# Patient Record
Sex: Male | Born: 1965 | Race: White | Hispanic: No | Smoking: Never smoker
Health system: Southern US, Community
[De-identification: ages and names within clinical notes are randomized; demographics above are authoritative.]

## PROBLEM LIST (undated history)

## (undated) DIAGNOSIS — A879 Viral meningitis, unspecified: Secondary | ICD-10-CM

## (undated) DIAGNOSIS — M549 Dorsalgia, unspecified: Secondary | ICD-10-CM

## (undated) DIAGNOSIS — I776 Arteritis, unspecified: Secondary | ICD-10-CM

## (undated) DIAGNOSIS — I252 Old myocardial infarction: Secondary | ICD-10-CM

## (undated) DIAGNOSIS — G4730 Sleep apnea, unspecified: Secondary | ICD-10-CM

## (undated) DIAGNOSIS — E662 Morbid (severe) obesity with alveolar hypoventilation: Secondary | ICD-10-CM

## (undated) DIAGNOSIS — R0602 Shortness of breath: Secondary | ICD-10-CM

## (undated) DIAGNOSIS — R1310 Dysphagia, unspecified: Secondary | ICD-10-CM

## (undated) DIAGNOSIS — I2510 Atherosclerotic heart disease of native coronary artery without angina pectoris: Secondary | ICD-10-CM

## (undated) DIAGNOSIS — I5030 Unspecified diastolic (congestive) heart failure: Secondary | ICD-10-CM

## (undated) DIAGNOSIS — L301 Dyshidrosis [pompholyx]: Secondary | ICD-10-CM

## (undated) HISTORY — DX: Morbid (severe) obesity with alveolar hypoventilation: E66.2

## (undated) HISTORY — DX: Sleep apnea, unspecified: G47.30

## (undated) HISTORY — DX: Dyshidrosis (pompholyx): L30.1

## (undated) HISTORY — DX: Arteritis, unspecified: I77.6

## (undated) HISTORY — DX: Dysphagia, unspecified: R13.10

## (undated) HISTORY — PX: CARDIAC CATHETERIZATION: SHX172

## (undated) HISTORY — DX: Shortness of breath: R06.02

## (undated) HISTORY — DX: Dorsalgia, unspecified: M54.9

## (undated) HISTORY — DX: Old myocardial infarction: I25.2

## (undated) HISTORY — DX: Viral meningitis, unspecified: A87.9

---

## 2001-04-17 ENCOUNTER — Encounter: Payer: Self-pay | Admitting: Gastroenterology

## 2001-04-17 ENCOUNTER — Encounter: Admission: RE | Admit: 2001-04-17 | Discharge: 2001-04-17 | Payer: Self-pay | Admitting: Gastroenterology

## 2004-08-24 ENCOUNTER — Inpatient Hospital Stay (HOSPITAL_COMMUNITY): Admission: EM | Admit: 2004-08-24 | Discharge: 2004-08-26 | Payer: Self-pay | Admitting: Emergency Medicine

## 2005-06-08 ENCOUNTER — Emergency Department (HOSPITAL_COMMUNITY): Admission: EM | Admit: 2005-06-08 | Discharge: 2005-06-09 | Payer: Self-pay | Admitting: Emergency Medicine

## 2008-05-13 ENCOUNTER — Encounter: Admission: RE | Admit: 2008-05-13 | Discharge: 2008-05-13 | Payer: Self-pay | Admitting: Family Medicine

## 2008-12-18 ENCOUNTER — Emergency Department (HOSPITAL_COMMUNITY): Admission: EM | Admit: 2008-12-18 | Discharge: 2008-12-18 | Payer: Self-pay | Admitting: Emergency Medicine

## 2009-12-22 ENCOUNTER — Emergency Department (HOSPITAL_COMMUNITY): Admission: EM | Admit: 2009-12-22 | Discharge: 2009-12-23 | Payer: Self-pay | Admitting: Emergency Medicine

## 2010-02-02 ENCOUNTER — Encounter: Admission: RE | Admit: 2010-02-02 | Discharge: 2010-05-03 | Payer: Self-pay | Admitting: Family Medicine

## 2011-04-06 NOTE — Discharge Summary (Signed)
NAME:  Michael [name], Michael [name]                ACCOUNT NO.:  [id number]   MEDICAL RECORD NO.:  [id number]          PATIENT TYPE:  INP   LOCATION:  5507                         FACILITY:  [facility]   PHYSICIAN:  Jackie Plum, M.D.DATE OF BIRTH:  1966/03/29   DATE OF ADMISSION:  08/23/2004  DATE OF DISCHARGE:  08/26/2004                                 DISCHARGE SUMMARY   DISCHARGE DIAGNOSIS:  Aseptic meningitis, likely viral etiology.   DISCHARGE MEDICATIONS:  1.  Darvocet-N 100 1-2 tabs q.6h. p.r.n.  2.  Ceftin 500 mg p.o. b.i.d. for five days only.   DISCHARGE LABORATORY:  WBC count 7.3, hemoglobin 13.5, hematocrit 38.3, MCV  81.6, platelet count 180.  Sodium 107, potassium 3.8, chloride 110, CO2 22,  glucose 90, BUN 11, creatinine 1.0, calcium 7.9.   PROCEDURES:  Lumbar puncture done on August 23, 2004.  Diagnostic workup  with CSF studies for tube one and four notable for tube one ____being______  colorless, cloudy, hazy, WBC count 275 per mm cube which is elevated, RBC  count 11 per mm cube  which is elevated, segmented neutrophils 18, a shunt  was elevated, lymphocytes 60, monocytes 21, eosinophils 1, glucose 54, total  protein elevated at 92 mg/dL.  Corresponding tube four studies notable for  colorless color, cloudy, hazy, WBC 221, RBC 9, segmented neutrophils 23  which is highly elevated, lymphocytes 60, and monocytes 17.   CONSULTANTS : applicable.   CONDITION ON DISCHARGE:  Improved and satisfactory.   REASON FOR HOSPITALIZATION:  Septic meningitis.  The patient is a 45-year-  old gentleman who does not have any significant medical problems.  He  presented with generalized pain, some __________ neck pains, nausea,  vomiting, headaches, and photophobia.  He was suspected of having a  meningitis and therefore, was admitted for this.  Please see full details  regarding the patient's presentation in admission H&P dictated by Dr.  __________, dated August 24, 2004.   HOSPITAL  COURSE:  On admission, the patient was admitted to the hospitalist  service.  He had a leukocytosis of 12,000 and his metabolic panel was  notable for hypokalemia of 2.3.  Secondary to his GI symptomatology, he was  started on IV Rocephin for bacterial coverage.  An LP was done which was  notable for the above results which were consistent with possible viral  meningitis.  CSF studies were sent for culture, results still pending  finally at the time of discharge.  They need to be checked at the outpatient  level.  Also HSV PCR was also obtained and I have the result at this point  but this is highly unlikely in this patient and therefore, HSV treatment was  not initiated.  His hypokalemia was replenished to normalization at time of  discharge.  Gram stain of CSF actually revealed no organisms.  He was  treated with Rocephin for the last two days.  It is unlikely that he has  active meningitis.  He opted to continue antibiotics for five more days as a  precautionary measure awaiting final culture results.  The patient  discharged  home today in stable satisfactory condition.  His neck pain is  significantly improved.  He does not  have any more photophobia.  He is stronger, has been eating well.  His  leukocytosis is resolved.  He does not have any fever and is planned for  discharge today with outpatient follow up as recommended.   He is to call Dr. Wynonia Lawman office in 7-10 days to be seen during that  time.      Geor   GO/MEDQ  D:  08/26/2004  T:  08/26/2004  Job:  [id number]   cc:   Sigmund Hazel, M.D.  [address]  [address] [place], [place] [address]  Fax: [phone]

## 2011-04-06 NOTE — H&P (Signed)
NAME:  Michael [name]                ACCOUNT NO.:  [id number]   MEDICAL RECORD NO.:  [id number]          PATIENT TYPE:  INP   LOCATION:  1828                         FACILITY:  [facility]   PHYSICIAN:  Theone Stanley, MD   DATE OF BIRTH:  Feb 25, 1966   DATE OF ADMISSION:  08/23/2004  DATE OF DISCHARGE:                                HISTORY & PHYSICAL   CHIEF COMPLAINT:  Headache.   HISTORY AND PHYSICAL:  Michael [name] is a 45 year old gentleman who states  that yesterday at 10 p.m. he started to have whole body aching myalgias.  He  then subsequently started to have chills and he stated I could not get  warm.  He did not sleep that night and in the morning, which is today, he  had a fever.  At that point in time, his pain is his headache behind his  eyes, at the nap of his neck, and his lower back.  It is an aching pain that  is constant.  He had some nausea with some dry heaves and any type of  movement or strain caused an increase in pain in the head, nape of the neck  and lower back.  He has a stiff neck and continues to have generalized aches  and pains.  Because of this, he went to [facility].  At that time, they had a suspicion of meningitis and sent him to  the ER.  This is the way he presented.   PAST MEDICAL HISTORY:  None.   MEDICATIONS:  None.   PAST SURGICAL HISTORY:  None.   ALLERGIES:  None.   SOCIAL HISTORY:  The patient is married but currently separated from his  wife.  He lives with his daughter in [place].  He has one child who is in  kindergarten.  No tobacco use.  He occasionally drinks alcohol.  He never  engages in any drug use.   FAMILY HISTORY:  Both mother and father are alive and healthy.   REVIEW OF SYSTEMS:  Photophobia, lightheadedness.  He denies any seizures or  weakness, asymmetric weakness.  No GI symptoms.  No GU symptoms, endocrine,  cardiovascular, respiratory symptoms.   PHYSICAL EXAMINATION:  VITAL SIGNS:   Temperature 99.1, blood pressure  130/82, pulse 104, respirations 24, saturation 98% on room air.  HEENT:  Head atraumatic, normocephalic.  Eyes reveal pupils are equal and  reactive to light.  Extraocular movements intact.  Ears without discharge.  Throat, oropharynx clear.  NECK:  The patient has positive nuchal rigidity.  CARDIOVASCULAR:  Regular rate and rhythm.  No murmurs, rubs or gallops  heard.  RESPIRATORY:  Clear to auscultation bilaterally.  ABDOMEN:  Soft, nontender, nondistended with active bowel sounds.  EXTREMITIES:  No edema, cyanosis or clubbing.   LABORATORY DATA:  White count of 12.0, hemoglobin 15, hematocrit 42, MCV of  81.  Platelets of 183.  Sodium 134, potassium 2.3, chloride 110, BUN 13,  glucose 111.  Creatinine 1.1.  The PT, PTT and INR were normal.  Urinalysis  showed specific gravity of 1.022,  greater than 80 mg/dl of ketones, some  protein.  While here in the area, he had a CT scan which showed no acute  issues.  At that point in time, the patient underwent an LP which they  analyzed.  Tube #4 appeared hazy, RBCs at 9, WBC of 221.  In tube #1, RBC  11, WBC at 275.  In tube #1, the cell count showed neutrophils at 18,  lymphocytes at 60%, monocytes at 21%, eosinophils 1% and tube #4 showed  neutrophils of 23%, lymphocytes 60%, monocytes at 17%.  CSF protein was 92  and the CSF and glucose was normal.   ASSESSMENT AND PLAN:  This is a 45 year old gentleman with minimal medical  problems presenting with headache, nausea, vomiting, generalized aches and  pains.  1.  Aseptic meningitis.  The patient's CSF points to a viral meningitis      which there is no definitive treatment for.  It is more a symptomatic      relief.  The patient will be admitted for supportive care, IV hydration,      pain medications, antiemetics.  We will send the CSF for culture and HSV      PCR.  2.  He has a bit of hypokalemia.  We will replace this.  3.  Prophylaxis:  Start him on  Lovenox subcu and a PPI.       AEJ/MEDQ  D:  08/24/2004  T:  08/24/2004  Job:  [id number]

## 2012-05-08 ENCOUNTER — Other Ambulatory Visit: Payer: Self-pay | Admitting: Family Medicine

## 2012-05-08 DIAGNOSIS — R609 Edema, unspecified: Secondary | ICD-10-CM

## 2012-05-13 ENCOUNTER — Ambulatory Visit
Admission: RE | Admit: 2012-05-13 | Discharge: 2012-05-13 | Disposition: A | Discharge status: Home / Self Care | Payer: BC Managed Care – PPO | Source: Ambulatory Visit | Attending: Family Medicine | Admitting: Family Medicine

## 2012-05-13 DIAGNOSIS — R609 Edema, unspecified: Secondary | ICD-10-CM

## 2013-03-26 ENCOUNTER — Telehealth: Payer: Self-pay | Admitting: Neurology

## 2013-03-26 ENCOUNTER — Other Ambulatory Visit: Payer: Self-pay | Admitting: Neurology

## 2013-03-26 DIAGNOSIS — R0683 Snoring: Secondary | ICD-10-CM

## 2013-03-26 DIAGNOSIS — R351 Nocturia: Secondary | ICD-10-CM

## 2013-03-26 NOTE — Telephone Encounter (Signed)
This patient qualifies for attended sleep study based on clinical history.  Patient will need a SPLIT at AHI 10 and 3% scoring.  Offer lunesta prn, Insomnia is a chief complaint  PLEASE NOTE :  CO2 necessary. BMI is 55.

## 2013-03-26 NOTE — Telephone Encounter (Signed)
. °  Dr. Sigmund Hazel is referring Michael [name], 47 y.o. y/o male, for the evaluation of sleep apnea.  Wt:  366.8 lbs. Ht:  68.5 in. BMI:  54.95  Diagnoses: OSA - borderline  HTN Morbid Obesity Nocturia Witnessed Apneas Insomnia Snoring Frequent Awakenings Non-Restorative Sleep  Medication List: Current Outpatient Prescriptions  Medication Sig Dispense Refill   furosemide (LASIX) 40 MG tablet Take 40 mg by mouth daily.       losartan (COZAAR) 100 MG tablet Take 100 mg by mouth daily.       metoprolol succinate (TOPROL-XL) 100 MG 24 hr tablet Take 100 mg by mouth daily. Take with or immediately following a meal.       zolpidem (AMBIEN CR) 12.5 MG CR tablet Take 12.5 mg by mouth at bedtime as needed for sleep.       No current facility-administered medications for this visit.    This patient presents to Dr. Sigmund Hazel to discuss the possibility of a sleep study.  Pt says that he had a sleep study possibly with [place] over 5 years ago.  The study revealed borderline sleep apnea with cpap therapy as optional.  The patient did not choose to proceed with pap therapy at the time.  He has since gained an additional 60+ pounds.  He has a neck size of 18.  Pt complains of significant nocturia awakening at least 5 times per night to use the bathroom.  He has a hard time falling back asleep.  He reports witnessed apneic events and snoring.  He has had a recent dx of hypertension that he feels may be due to underlying sleep apnea.  Would like to have an attended study asap.  Insurance:  [employer] [place] - precertification is not required

## 2013-03-27 ENCOUNTER — Ambulatory Visit (INDEPENDENT_AMBULATORY_CARE_PROVIDER_SITE_OTHER): Payer: BC Managed Care – PPO | Admitting: Neurology

## 2013-03-27 DIAGNOSIS — I10 Essential (primary) hypertension: Secondary | ICD-10-CM

## 2013-03-27 DIAGNOSIS — G4710 Hypersomnia, unspecified: Secondary | ICD-10-CM

## 2013-03-27 DIAGNOSIS — R0683 Snoring: Secondary | ICD-10-CM

## 2013-03-27 DIAGNOSIS — R351 Nocturia: Secondary | ICD-10-CM

## 2013-03-27 DIAGNOSIS — R5383 Other fatigue: Secondary | ICD-10-CM

## 2013-03-27 DIAGNOSIS — G4733 Obstructive sleep apnea (adult) (pediatric): Secondary | ICD-10-CM

## 2013-03-31 ENCOUNTER — Other Ambulatory Visit: Payer: Self-pay | Admitting: *Deleted

## 2013-03-31 ENCOUNTER — Telehealth: Payer: Self-pay | Admitting: *Deleted

## 2013-03-31 DIAGNOSIS — G4733 Obstructive sleep apnea (adult) (pediatric): Secondary | ICD-10-CM

## 2013-03-31 NOTE — Telephone Encounter (Signed)
Called patient to discuss sleep study results from 03/27/13 .  Discussed findings, recommendations and follow up care.  Patient understood well and all questions were answered.  Pt understands the severity of his sleep apnea.  Pt actually received a call from [employer] yesterday and understands the DME company will be calling him ASAP.  He is excited to get started and remarks that he slept so much better here in the sleep lab with PAP therapy.

## 2013-08-11 ENCOUNTER — Encounter: Payer: Self-pay | Admitting: Neurology

## 2013-08-27 ENCOUNTER — Encounter: Payer: Self-pay | Admitting: Neurology

## 2013-08-27 ENCOUNTER — Encounter (INDEPENDENT_AMBULATORY_CARE_PROVIDER_SITE_OTHER): Payer: Self-pay

## 2013-08-27 ENCOUNTER — Ambulatory Visit (INDEPENDENT_AMBULATORY_CARE_PROVIDER_SITE_OTHER): Payer: BC Managed Care – PPO | Admitting: Neurology

## 2013-08-27 VITALS — BP 123/74 | HR 62 | Resp 16 | Ht 68.0 in | Wt 296.0 lb

## 2013-08-27 DIAGNOSIS — R351 Nocturia: Secondary | ICD-10-CM

## 2013-08-27 DIAGNOSIS — G4733 Obstructive sleep apnea (adult) (pediatric): Secondary | ICD-10-CM

## 2013-08-27 DIAGNOSIS — E662 Morbid (severe) obesity with alveolar hypoventilation: Secondary | ICD-10-CM | POA: Insufficient documentation

## 2013-08-27 DIAGNOSIS — G4730 Sleep apnea, unspecified: Secondary | ICD-10-CM

## 2013-08-27 HISTORY — DX: Sleep apnea, unspecified: G47.30

## 2013-08-27 NOTE — Progress Notes (Signed)
[facility]  Provider:  Melvyn Novas, M D  Referring Provider: No ref. provider found Primary Care Physician:  Neldon Labella, MD  Chief Complaint  Patient presents with  . Follow-up    sleep apnea, rm 11    HPI:  Michael [name] is a 47 y.o. male  Is seen here as a referral SLEEP CONSULTATION  from Dr. Hyacinth Meeker .  Mr. Murgia is seen here today following up on a sleep study. The patient was originally seen on 5-A.-14 and perhaps described symptoms that were avulsed likely to be present obstructive sleep apnea. These were her hypertension, nocturia, witnessed apneas, trouble sleeping through the night with frequent awakenings nonrestorative sleep. The patient had also been reportedly adult. The he was scheduled for the sleep study he has actually lost 72 pounds .  The patient reported that once the CPAP was issued to him after sleep apnea was confirmed the diagnosis he lost 20 pounds in the first 4 months of CPAP use, basically attributed to more restful and restorative sleep and more energy and daytime. The next 50 pounds were lost with dietary efforts and 2 months ago he begun exercising.  03-27-13  the essential information  from his sleep study the patient had an AHI of 121.8 there was no REM sleep nor did he slept all night and supine.  His oxygen at nadir was 76% with a total of 61.1 minutes of desaturation of oxygen. The pulse oximetry indicated desaturations of 87.3 times per hour of sleep.  The patient was titrated to CPAP up to 10 cm but had an incomplete response the central apneas arising. It was therefore decided to change him to a BiPAP and she tolerated 12/8 cm water very well. Clearly improvement in sleep continuity and REM sleep rebound. Sleep efficiency was 93.4% the apnea index bowel was reduced 0.0. He did pass at a setting of 13 cm expiratory pressure and 19 cm inspiratory pressure. Crescendo snoring was resolved. The oxygen nadir increased to 90% after 2  L of oxygen were added at night as a BiPAP machine. This may not need to continue.  Patient's regular bedtimes around 9 PM, he rises at 5 AM, he estimates all wall sleep time during the night at about 7 hours. Is not aware of any family history of sleep disorders and he had no disorder of sleep in childhood. The nor trauma, surgery or radiation to the upper airway, lungs or neck.  Peter download from the patient's BiPAP machine set at date 04-06-13 shows the patient to use BiPAP at 19/13 cm water 426 days with an 88% compliance with an average time in therapy per day of 5 hours 39 minutes. AHI on download was 1.8. CPAP download obtained in office today 08-26-13 again on the same setting 19/13 cm water, documents an AHI of 1.8 average daily time in therapy 5 hours 37 minutes. Respiratory rate is 20 per minute, minute ventilation volume is 12.8 L per minute, tidal volume is even at 400 ml, leak is mild to moderate.     Review of Systems: During the sleep consultation the patient reports that several of the symptoms originally presented with have not resolved and stable he had never brought in context with sleep apnea before. He longer has high blood pressure at this time, his hyperlipidemia is reduced, migraine headaches have resolved, nocturia is up to 12 bathroom breaks nightly has resolved, he is not on the daytime tired or sleepy and endorsed the  Epworth sleepiness score at 6 pints and the fatigue severity scale at 34 points. He no longer has leg edema he he was unable to lose weight which has now been proven to be less of a difficulty for him. He is nor longer short-winded his snoring has resolved and he states that he is nor longer tossing and turning his sleep is restorative and quiet. Out of a complete 14 system review, the patient complains of only the following symptoms, and all other reviewed systems are negative.   History   Social History  . Marital Status: Legally Separated    Spouse  Name: N/A    Number of Children: N/A  . Years of Education: N/A   Occupational History  . Not on file.   Social History Main Topics  . Smoking status: Never Smoker   . Smokeless tobacco: Never Used  . Alcohol Use: Not on file  . Drug Use: Not on file  . Sexual Activity: Not on file   Other Topics Concern  . Not on file   Social History Narrative  . No narrative on file    Family History  Problem Relation Age of Onset  . Alzheimer's disease Mother     Past Medical History  Diagnosis Date  . Sleep apnea   . Obesity hypoventilation syndrome   . Sleep apnea with use of continuous positive airway pressure (CPAP) 08/27/2013    No past surgical history on file.  Current Outpatient Prescriptions  Medication Sig Dispense Refill  . losartan (COZAAR) 100 MG tablet Take 100 mg by mouth daily.      . metoprolol succinate (TOPROL-XL) 100 MG 24 hr tablet Take 100 mg by mouth daily. Take with or immediately following a meal.      . simvastatin (ZOCOR) 20 MG tablet daily.        No current facility-administered medications for this visit.    Allergies as of 08/27/2013  . (No Known Allergies)    Vitals: BP 123/74  Pulse 62  Resp 16  Ht 5\' 8"  (1.727 m)  Wt 296 lb (134.265 kg)  BMI 45.02 kg/m2 Last Weight:  Wt Readings from Last 1 Encounters:  08/27/13 296 lb (134.265 kg)   Last Height:   Ht Readings from Last 1 Encounters:  08/27/13 5\' 8"  (1.727 m)   Physical exam:  General: The patient is awake, alert and appears not in acute distress. The patient is well groomed. Head: Normocephalic, atraumatic. Neck is supple. Mallampati 2 with lateral crowding, and retrognathia , neck circumference:20 Cardiovascular:  Regular rate and rhythm , without  murmurs or carotid bruit, and without distended neck veins. Respiratory: Lungs are clear to auscultation. Skin:  With evidence of edema  Only 1 plus ,no rash Trunk: BMI is elevated and patient  has normal posture.  Neurologic  exam : The patient is awake and alert, oriented to place and time.  Memory subjective  described as intact. There is a normal attention span & concentration ability.  Speech is fluent without   dysarthria, dysphonia or aphasia. Mood and affect are appropriate.  Cranial nerves: Pupils are equal and briskly reactive to light. Funduscopic exam without  evidence of pallor or edema. Extraocular movements  in vertical and horizontal planes intact and without nystagmus. Visual fields by finger perimetry are intact. Hearing to finger rub intact.  Facial sensation intact to fine touch. Facial motor strength is symmetric and tongue and uvula move midline.  Motor exam:   Normal tone and  normal muscle bulk and symmetric normal strength in all extremities.  Sensory:  Fine touch, pinprick and vibration were tested in all extremities. Proprioception is  normal.  Coordination: Rapid alternating movements in the fingers/hands is tested and normal. Finger-to-nose maneuver tested and normal without evidence of ataxia, dysmetria or tremor.  Gait and station: Patient walks without assistive device . Stance is stable and normal.  Wide based due to obesity .  Deep tendon reflexes: in the  upper and lower extremities are symmetric and intact. Babinski maneuver downgoing.   Assessment:  After physical and neurologic examination, review of laboratory studies, imaging, neurophysiology testing and pre-existing records, assessment is  1) obesity hypoventilation with associated morning headaches and retroorbital pressure.  2)overlap syndrome with OSA , severe.  3) morbid obesity with associated SOB, Nocturia, EDS.     Plan:  Treatment plan and additional workup :  Continue BiPAP ,  13 over 19cm  , with FFM Continue weight loss, high BMI is main risk factor.  Avoid supine sleep. OSA information- patient has been accessing through the internet. OSA education and additional information, 45  minutes.

## 2013-08-27 NOTE — Patient Instructions (Signed)
Sleep Apnea Sleep apnea is disorder that affects a person's sleep. A person with sleep apnea has abnormal pauses in their breathing when they sleep. It is hard for them to get a good sleep. This makes a person tired during the day. It also can lead to other physical problems. There are three types of sleep apnea. One type is when breathing stops for a short time because your airway is blocked (obstructive sleep apnea). Another type is when the brain sometimes fails to give the normal signal to breathe to the muscles that control your breathing (central sleep apnea). The third type is a combination of the other two types. HOME CARE  Do not sleep on your back. Try to sleep on your side.  Take all medicine as told by your doctor.  Avoid alcohol, calming medicines (sedatives), and depressant drugs.  Try to lose weight if you are overweight. Talk to your doctor about a healthy weight goal. Your doctor may have you use a device that helps to open your airway. It can help you get the air that you need. It is called a positive airway pressure (PAP) device. There are three types of PAP devices:  Continuous positive airway pressure (CPAP) device.  Nasal expiratory positive airway pressure (EPAP) device.  Bilevel positive airway pressure (BPAP) device. MAKE SURE YOU:  Understand these instructions.  Will watch your condition.  Will get help right away if you are not doing well or get worse. Document Released: 08/14/2008 Document Revised: 10/22/2012 Document Reviewed: 03/08/2012 [facility] Patient Information 2014 [place], [place]. Polysomnography (Sleep Studies) Polysomnography (PSG) is a series of tests used for detecting (diagnosing) obstructive sleep apnea and other sleep disorders. The tests measure how some parts of your body are working while you are sleeping. The tests are extensive and expensive. They are done in a sleep lab or hospital, and vary from center to center. Your caregiver may  perform other more simple sleep studies and questionnaires before doing more complete and involved testing. Testing may not be covered by insurance. Some of these tests are:  An EEG (Electroencephalogram). This tests your brain waves and stages of sleep.  An EOG (Electrooculogram). This measures the movements of your eyes. It detects periods of REM (rapid eye movement) sleep, which is your dream sleep.  An EKG (Electrocardiogram). This measures your heart rhythm.  EMG (Electromyography). This is a measurement of how the muscles are working in your upper airway and your legs while sleeping.  An oximetry measurement. It measures how much oxygen (air) you are getting while sleeping.  Breathing efforts may be measured. The same test can be interpreted (understood) differently by different caregivers and centers that study sleep.  Studies may be given an apnea/hypopnea index (AHI). This is a number which is found by counting the times of no breathing or under breathing during the night, and relating those numbers to the amount of time spent in bed. When the AHI is greater than 15, the patient is likely to complain of daytime sleepiness. When the AHI is greater than 30, the patient is at increased risk for heart problems and must be followed more closely. Following the AHI also allows you to know how treatment is working. Simple oximetry (tracking the amount of oxygen that is taken in) can be used for screening patients who:  Do not have symptoms (problems) of OSA.  Have a normal Epworth Sleepiness Scale Score.  Have a low pre-test probability of having OSA.  Have none of the  upper airway problems likely to cause apnea.  Oximetry is also used to determine if treatment is effective in patients who showed significant desaturations (not getting enough oxygen) on their home sleep study. One extra measure of safety is to perform additional studies for the person who only snores. This is because no  one can predict with absolute certainty who will have OSA. Those who show significant desaturations (not getting enough oxygen) are recommended to have a more detailed sleep study. Document Released: 05/12/2003 Document Revised: 01/28/2012 Document Reviewed: 11/05/2005 [facility] Patient Information 2014 [place], [place]. CPAP and BIPAP CPAP and BIPAP are methods of helping you breathe. CPAP stands for "continuous positive airway pressure." BIPAP stands for "bi-level positive airway pressure." Both CPAP and BIPAP are provided by a small machine with a flexible plastic tube that attaches to a plastic mask that goes over your nose or mouth. Air is blown into your air passages through your nose or mouth. This helps to keep your airways open and helps to keep you breathing well. The amount of pressure that is used to blow the air into your air passages can be set on the machine. The pressure setting is based on your needs. With CPAP, the amount of pressure stays the same while you breathe in and out. With BIPAP, the amount of pressure changes when you inhale and exhale. Your caregiver will recommend whether CPAP or BIPAP would be more helpful for you.  CPAP and BIPAP can be helpful for both adults and children with:  Sleep apnea.  Chronic Obstructive Pulmonary Disease (COPD), a condition like emphysema.  Diseases which weaken the muscles of the chest such as muscular dystrophy or neurological diseases.  Other problems that cause breathing to be weak or difficult. USE OF CPAP OR BIPAP The respiratory therapist or technician will help you get used to wearing the mask. Some people feel claustrophobic (a trapped or closed in feeling) at first, because the mask needs to be fairly snug on your face.   It may help you to get used to the mask gradually, by first holding the mask loosely over your nose or mouth using a low pressure setting on the machine. Gradually the mask can be applied more snugly with  increased pressure. You can also gradually increase the amount of time the mask is used.  People with sleep apnea will use the mask and machine at night when they are sleeping. Others, like those with ALS or other breathing difficulties, may need the CPAP or BIPAP all the time.  If the first mask you try does not fit well, or is uncomfortable, there are other types and sizes that can be tried.  If you tend to breathe through your mouth, a chin strap may be applied to help keep your mouth closed (if you are using a nasal mask).  The CPAP and BIPAP machines have alarms that may sound if the mask comes off or develops a leak.  You should not eat or drink while the CPAP or BIPAP is on. Food or fluids could get pushed into your lungs by the pressure of the CPAP or BIPAP. Sometimes CPAP or BIPAP machines are ordered for home use. If you are going to use the CPAP or BIPAP machine at home, follow these instructions  CPAP or BIPAP machines can be rented or purchased through home health care companies. There are many different brands of machines available. If you rent a machine before purchasing you may find which particular machine works  well for you.  Ask questions if there is something you do not understand when picking out your machine.  Place your CPAP or BIPAP machine on a secure table or stand near an electrical outlet.  Know where the On/Off switch is.  Follow your doctor's instructions for how to set the pressure on your machine and when you should use it.  Do not smoke! Tobacco smoke residue can damage the machine. SEEK IMMEDIATE MEDICAL CARE IF:   You have redness or open areas around your nose or mouth.  You have trouble operating the CPAP or BIPAP machine.  You cannot tolerate wearing the CPAP or BIPAP mask.  You have any questions or concerns. Document Released: 08/03/2004 Document Revised: 01/28/2012 Document Reviewed: 11/02/2008 [facility] Patient Information 2014 [place],  [place].

## 2013-09-22 ENCOUNTER — Encounter: Payer: Self-pay | Admitting: Neurology

## 2014-02-23 ENCOUNTER — Encounter: Payer: Self-pay | Admitting: Neurology

## 2014-02-24 ENCOUNTER — Encounter (INDEPENDENT_AMBULATORY_CARE_PROVIDER_SITE_OTHER): Payer: Self-pay

## 2014-02-24 ENCOUNTER — Ambulatory Visit (INDEPENDENT_AMBULATORY_CARE_PROVIDER_SITE_OTHER): Payer: BC Managed Care – PPO | Admitting: Neurology

## 2014-02-24 ENCOUNTER — Encounter: Payer: Self-pay | Admitting: Neurology

## 2014-02-24 VITALS — BP 134/80 | HR 54 | Resp 18 | Ht 68.0 in | Wt 328.0 lb

## 2014-02-24 DIAGNOSIS — G4733 Obstructive sleep apnea (adult) (pediatric): Secondary | ICD-10-CM | POA: Insufficient documentation

## 2014-02-24 DIAGNOSIS — Z9989 Dependence on other enabling machines and devices: Principal | ICD-10-CM

## 2014-02-24 NOTE — Progress Notes (Signed)
[facility]  Provider:  Melvyn Novasarmen  Arnie Maiolo, M D  Referring Provider: Sigmund HazelMiller, Lisa, MD Primary Care Physician:  Neldon LabellaMILLER,LISA LYNN, MD  Chief Complaint  Patient presents with  . Follow-up    Room 11  . Sleep Apnea    HPI:  Michael [name] is a 48 y.o. male  Is seen here as a referral SLEEP CONSULTATION  from Dr. Hyacinth MeekerMiller .  Michael [name] is seen here today following up on a sleep study. The patient was originally seen on 03-26-13 and described symptoms that were  likely to be present obstructive sleep apnea. These were  hypertension, nocturia, witnessed apneas, trouble sleeping through the night with frequent awakenings  And nonrestorative sleep. The patient had also been reportedly apnoeic . He  scheduled for the sleep study , meanwhile he had actually lost 72 pounds .  The patient reported that once the CPAP was issued to him (after sleep apnea was confirmed as diagnosis ) he lost 20 pounds in the first 4 months of CPAP use, basically attributed to more restful and restorative sleep and more energy and daytime. The next 50 pounds were lost with dietary efforts and 2 months ago he begun exercising. ON  03-27-13  , his sleep study documented  an AHI of 121.8 , there was no REM sleep nor did he slept all night and supine.  His oxygen at nadir was 76% with a total of 61.1 minutes of desaturation of oxygen. The pulse oximetry indicated desaturations of 87.3 times per hour of sleep. The patient was titrated to CPAP up to 10 cm but had an incomplete response the central apneas arising. It was therefore decided to change him to a BiPAP and she tolerated 12/8 cm water very well. Clearly improvement in sleep continuity and REM sleep rebound. Sleep efficiency was 93.4% the apnea index bowel was reduced 0.0. He did pass at a setting of 13 cm expiratory pressure and 19 cm inspiratory pressure. Crescendo snoring was resolved. The oxygen nadir increased to 90% after 2 L of oxygen were added at night as a  BiPAP machine.   Today's ( 02-24-14 ) data download from VPAP machine ,  documented  a 75% compliance by you with all vital 4 hours nightly, exploration pressure of 13 cm water inspiration pressure of 19 cm water air leak is mild-to-moderate, minute ventilation by liters is 540 mL, respiratory rate medium is 17 a minute. Residual AHI is 1.3 which is a significant reduction ( baseline  AHI was 121.8).   Patient's regular bed time is now  around 9.30  PM, he rises at 5 AM,  He wakes up spontaneously,  he estimates all wall sleep time during the night on CPAP  at about 7 hours. DME is [facility],      Is not aware of any family history of sleep disorders and he had no disorder of sleep in childhood. The nor trauma, surgery or radiation to the upper airway, lungs or neck.  Last visit download  08-28-13 from the patient's BiPAP machine set at date 04-06-13 shows the patient to use BiPAP at 19/13 cm water 426 days with an 88% compliance with an average time in therapy per day of 5 hours 39 minutes. AHI on download was 1.8. CPAP download obtained in office today 08-26-13 again on the same setting 19/13 cm water, documents an AHI of 1.8 average daily time in therapy 5 hours 37 minutes. Respiratory rate is 20 per minute, minute ventilation volume is  12.8 L per minute, tidal volume is even at 400 ml, leak is mild to moderate.     Review of Systems: During the sleep consultation the patient reports that several of the symptoms originally presented , now  resolved and stable.  He had never brought in context with sleep apnea before.  He longer has high blood pressure at this time, his hyperlipidemia is reduced, migraine headaches have resolved, nocturia is up to 12 bathroom breaks nightly-has resolved, no bathroom breaks.   he is not on the daytime tired or sleepy,  and endorsed the Epworth sleepiness score at 8 points and the fatigue severity scale at 34 points.  He no longer has leg edema - resolved. He  he was unable to lose weight for a long time,  which has now been proven to be less of a difficulty for him.  He is nor longer short-winded his snoring has resolved and he states that he is nor longer tossing and turning his sleep is restorative and quiet. Out of a complete 14 system review, the patient complains of only the following symptoms, and all other reviewed systems are negative.   History   Social History  . Marital Status: Legally Separated    Spouse Name: N/A    Number of Children: N/A  . Years of Education: 14   Occupational History  .     Social History Main Topics  . Smoking status: Never Smoker   . Smokeless tobacco: Never Used  . Alcohol Use: Yes     Comment: rarely  . Drug Use: No  . Sexual Activity: Not on file   Other Topics Concern  . Not on file   Social History Narrative   Patient is single and lives alone.   Patient is working full-time.   Patient has a college education.   Patient is right-handed.   Patient drinks 3-4 cups of coffee daily.    Family History  Problem Relation Age of Onset  . Alzheimer's disease Mother     Past Medical History  Diagnosis Date  . Sleep apnea   . Obesity hypoventilation syndrome   . Sleep apnea with use of continuous positive airway pressure (CPAP) 08/27/2013    History reviewed. No pertinent past surgical history.  Current Outpatient Prescriptions  Medication Sig Dispense Refill  . amoxicillin-clavulanate (AUGMENTIN) 875-125 MG per tablet       . losartan (COZAAR) 100 MG tablet Take 100 mg by mouth daily.      . metoprolol succinate (TOPROL-XL) 100 MG 24 hr tablet Take 100 mg by mouth daily. Take with or immediately following a meal.      . simvastatin (ZOCOR) 20 MG tablet daily.        No current facility-administered medications for this visit.    Allergies as of 02/24/2014  . (No Known Allergies)    Vitals: BP 134/80  Pulse 54  Resp 18  Ht 5\' 8"  (1.727 m)  Wt 328 lb (148.78 kg)  BMI 49.88  kg/m2 Last Weight:  Wt Readings from Last 1 Encounters:  02/24/14 328 lb (148.78 kg)   Last Height:   Ht Readings from Last 1 Encounters:  02/24/14 5\' 8"  (1.727 m)   Physical exam:  General: The patient is awake, alert and appears not in acute distress. The patient is well groomed. Head: Normocephalic, atraumatic.  Neck is supple. Mallampati 2 with lateral crowding, and retrognathia , neck circumference:21.5, nasal congestion " hay fever" .  Cardiovascular:  Regular  rate and rhythm , without murmurs or carotid bruit, and without distended neck veins. Respiratory: Lungs are clear to auscultation. Skin:  Without evidence of edema ,no rash Trunk: BMI is elevated and patient  has normal posture.  Neurologic exam : The patient is awake and alert, oriented to place and time.  Memory subjective  described as intact. There is a normal attention span & concentration ability.  Speech is fluent without   dysarthria, dysphonia or aphasia. Mood and affect are appropriate.  Cranial nerves: Pupils are equal and briskly reactive to light. Funduscopic exam without  evidence of pallor or edema. Extraocular movements  in vertical and horizontal planes intact and without nystagmus. Visual fields by finger perimetry are intact. Hearing to finger rub intact.  Facial sensation intact to fine touch. Facial motor strength is symmetric and tongue and uvula move midline.  Motor exam:   Normal tone and normal muscle bulk and symmetric normal strength in all extremities.  Sensory:  Fine touch, pinprick and vibration were tested in all extremities. Proprioception is  normal.  Coordination: Rapid alternating movements in the fingers/hands is tested and normal. Finger-to-nose maneuver tested and normal without evidence of ataxia, dysmetria or tremor.  Gait and station: Patient walks without assistive device . Stance is stable and normal.  Wide based due to obesity .  Deep tendon reflexes: in the  upper and lower  extremities are symmetric and intact. Babinski maneuver downgoing.   Assessment:  After physical and neurologic examination, review of laboratory studies, imaging, neurophysiology testing and pre-existing records, assessment is  1) obesity hypoventilation with associated morning headaches and retroorbital pressure.  2)overlap syndrome with OSA , severe.  3) morbid obesity with associated SOB, Nocturia, EDS.     Plan:  Treatment plan and additional workup :  Continue BiPAP ,  13 over 19cm  , with FFM , DME [facility] . Continue weight loss, high BMI is main risk factor for OSA - patient has been in need of a nutritional adviser, counseling, possible weight reduction surgery .Marland Kitchen  Avoids supine sleep. OSA information- patient has been accessing through the internet. OSA education and additional information, 30 minutes.

## 2014-02-24 NOTE — Patient Instructions (Signed)
Obesity Obesity is defined as having too much total body fat and a body mass index (BMI) of 30 or more. BMI is an estimate of body fat and is calculated from your height and weight. Obesity happens when you consume more calories than you can burn by exercising or performing daily physical tasks. Prolonged obesity can cause major illnesses or emergencies, such as:   A stroke.  Heart disease.  Diabetes.  Cancer.  Arthritis.  High blood pressure (hypertension).  High cholesterol.  Sleep apnea.  Erectile dysfunction.  Infertility problems. CAUSES   Regularly eating unhealthy foods.  Physical inactivity.  Certain disorders, such as an underactive thyroid (hypothyroidism), Cushing's syndrome, and polycystic ovarian syndrome.  Certain medicines, such as steroids, some depression medicines, and antipsychotics.  Genetics.  Lack of sleep. DIAGNOSIS  A caregiver can diagnose obesity after calculating your BMI. Obesity will be diagnosed if your BMI is 30 or higher.  There are other methods of measuring obesity levels. Some other methods include measuring your skin fold thickness, your waist circumference, and comparing your hip circumference to your waist circumference. TREATMENT  A healthy treatment program includes some or all of the following:  Long-term dietary changes.  Exercise and physical activity.  Behavioral and lifestyle changes.  Medicine only under the supervision of your caregiver. Medicines may help, but only if they are used with diet and exercise programs. An unhealthy treatment program includes:  Fasting.  Fad diets.  Supplements and drugs. These choices do not succeed in long-term weight control.  HOME CARE INSTRUCTIONS   Exercise and perform physical activity as directed by your caregiver. To increase physical activity, try the following:  Use stairs instead of elevators.  Park farther away from store entrances.  Garden, bike, or walk instead of  watching television or using the computer.  Eat healthy, low-calorie foods and drinks on a regular basis. Eat more fruits and vegetables. Use low-calorie cookbooks or take healthy cooking classes.  Limit fast food, sweets, and processed snack foods.  Eat smaller portions.  Keep a daily journal of everything you eat. There are many free websites to help you with this. It may be helpful to measure your foods so you can determine if you are eating the correct portion sizes.  Avoid drinking alcohol. Drink more water and drinks without calories.  Take vitamins and supplements only as recommended by your caregiver.  Weight-loss support groups, Registered Dieticians, counselors, and stress reduction education can also be very helpful. SEEK IMMEDIATE MEDICAL CARE IF:  You have chest pain or tightness.  You have trouble breathing or feel short of breath.  You have weakness or leg numbness.  You feel confused or have trouble talking.  You have sudden changes in your vision. MAKE SURE YOU:  Understand these instructions.  Will watch your condition.  Will get help right away if you are not doing well or get worse. Document Released: 12/13/2004 Document Revised: 05/06/2012 Document Reviewed: 12/12/2011 [employer] Patient Information 2014 [employer].  

## 2017-02-12 DIAGNOSIS — E669 Obesity, unspecified: Secondary | ICD-10-CM | POA: Diagnosis not present

## 2017-02-12 DIAGNOSIS — E7800 Pure hypercholesterolemia, unspecified: Secondary | ICD-10-CM | POA: Diagnosis not present

## 2017-02-12 DIAGNOSIS — Z202 Contact with and (suspected) exposure to infections with a predominantly sexual mode of transmission: Secondary | ICD-10-CM | POA: Diagnosis not present

## 2017-02-12 DIAGNOSIS — Z0000 Encounter for general adult medical examination without abnormal findings: Secondary | ICD-10-CM | POA: Diagnosis not present

## 2017-02-12 DIAGNOSIS — I10 Essential (primary) hypertension: Secondary | ICD-10-CM | POA: Diagnosis not present

## 2017-04-08 DIAGNOSIS — G4700 Insomnia, unspecified: Secondary | ICD-10-CM | POA: Diagnosis not present

## 2017-04-08 DIAGNOSIS — I10 Essential (primary) hypertension: Secondary | ICD-10-CM | POA: Diagnosis not present

## 2017-04-08 DIAGNOSIS — E7800 Pure hypercholesterolemia, unspecified: Secondary | ICD-10-CM | POA: Diagnosis not present

## 2017-04-08 DIAGNOSIS — E669 Obesity, unspecified: Secondary | ICD-10-CM | POA: Diagnosis not present

## 2017-05-17 DIAGNOSIS — S39012A Strain of muscle, fascia and tendon of lower back, initial encounter: Secondary | ICD-10-CM | POA: Diagnosis not present

## 2017-05-17 DIAGNOSIS — G4700 Insomnia, unspecified: Secondary | ICD-10-CM | POA: Diagnosis not present

## 2017-05-17 DIAGNOSIS — Z6836 Body mass index (BMI) 36.0-36.9, adult: Secondary | ICD-10-CM | POA: Diagnosis not present

## 2017-05-17 DIAGNOSIS — E7800 Pure hypercholesterolemia, unspecified: Secondary | ICD-10-CM | POA: Diagnosis not present

## 2017-06-18 DIAGNOSIS — H35461 Secondary vitreoretinal degeneration, right eye: Secondary | ICD-10-CM | POA: Diagnosis not present

## 2017-07-01 DIAGNOSIS — J189 Pneumonia, unspecified organism: Secondary | ICD-10-CM | POA: Diagnosis not present

## 2017-07-01 DIAGNOSIS — R5383 Other fatigue: Secondary | ICD-10-CM | POA: Diagnosis not present

## 2017-07-01 DIAGNOSIS — R062 Wheezing: Secondary | ICD-10-CM | POA: Diagnosis not present

## 2017-07-29 DIAGNOSIS — H35041 Retinal micro-aneurysms, unspecified, right eye: Secondary | ICD-10-CM | POA: Diagnosis not present

## 2018-01-15 DIAGNOSIS — L301 Dyshidrosis [pompholyx]: Secondary | ICD-10-CM | POA: Diagnosis not present

## 2018-01-15 DIAGNOSIS — L300 Nummular dermatitis: Secondary | ICD-10-CM | POA: Diagnosis not present

## 2018-01-20 DIAGNOSIS — J329 Chronic sinusitis, unspecified: Secondary | ICD-10-CM | POA: Diagnosis not present

## 2018-01-20 DIAGNOSIS — J3489 Other specified disorders of nose and nasal sinuses: Secondary | ICD-10-CM | POA: Diagnosis not present

## 2018-01-20 DIAGNOSIS — J111 Influenza due to unidentified influenza virus with other respiratory manifestations: Secondary | ICD-10-CM | POA: Diagnosis not present

## 2018-01-20 DIAGNOSIS — R062 Wheezing: Secondary | ICD-10-CM | POA: Diagnosis not present

## 2018-03-03 DIAGNOSIS — I10 Essential (primary) hypertension: Secondary | ICD-10-CM | POA: Diagnosis not present

## 2018-03-03 DIAGNOSIS — R635 Abnormal weight gain: Secondary | ICD-10-CM | POA: Diagnosis not present

## 2018-03-03 DIAGNOSIS — E7800 Pure hypercholesterolemia, unspecified: Secondary | ICD-10-CM | POA: Diagnosis not present

## 2018-08-28 ENCOUNTER — Encounter: Payer: Self-pay | Admitting: Family Medicine

## 2018-08-28 ENCOUNTER — Ambulatory Visit: Payer: BLUE CROSS/BLUE SHIELD | Admitting: Family Medicine

## 2018-08-28 DIAGNOSIS — N529 Male erectile dysfunction, unspecified: Secondary | ICD-10-CM | POA: Insufficient documentation

## 2018-08-28 DIAGNOSIS — I10 Essential (primary) hypertension: Secondary | ICD-10-CM

## 2018-08-28 DIAGNOSIS — E785 Hyperlipidemia, unspecified: Secondary | ICD-10-CM | POA: Diagnosis not present

## 2018-08-28 DIAGNOSIS — Z23 Encounter for immunization: Secondary | ICD-10-CM | POA: Diagnosis not present

## 2018-08-28 HISTORY — DX: Hyperlipidemia, unspecified: E78.5

## 2018-08-28 HISTORY — DX: Essential (primary) hypertension: I10

## 2018-08-28 MED ORDER — SILDENAFIL CITRATE 20 MG PO TABS: 20.0000 mg | ORAL_TABLET | Freq: Every day | ORAL | 3 refills | Status: DC | PRN

## 2018-08-28 NOTE — Patient Instructions (Addendum)
It was very nice to see you today!  Please keep an eye on the lump on your side. Let me know if it does not continue to improve.  You can stop your blood pressure medication.  Come back soon for your annual physical with blood work.   Take care, Dr [name]

## 2018-08-28 NOTE — Assessment & Plan Note (Signed)
At goal off meds. Discussed lifestyle modifications and home blood pressure monitoring with goal of 140/90 or lower.

## 2018-08-28 NOTE — Progress Notes (Signed)
Subjective:  Michael [name] is a 52 y.o. male who presents today with a chief complaint of skin lump and to establish care.   HPI:  Skin Lump, new problem Started about a week ago.  Located on right flank, back, and abdomen.  No obvious precipitating events or trauma.  Symptoms have improved over the last week or chills.  No skin changes.  No treatments tried.  No other obvious alleviating or aggravating factors.  Hyperlipidemia, chronic problem, new to provider He intermittently takes simvastatin 20 mg daily.  Had lipid panel done about 6 months ago, however does not remember the results.  Hypertension, chronic problem, new to provider He is prescribed losartan, however has not been on this for several months.  No reported chest pain or shortness of breath.  Erectile Dysfunction, chronic problem, new to provider Several year history.  On sildenafil as needed.  Works well.  No reported side effects.  ROS: Per HPI, otherwise a complete review of systems was negative.   PMH:  The following were reviewed and entered/updated in epic: Past Medical History:  Diagnosis Date  . Obesity hypoventilation syndrome (HCC)   . Sleep apnea   . Sleep apnea with use of continuous positive airway pressure (CPAP) 08/27/2013  . Viral meningitis    Patient Active Problem List   Diagnosis Date Noted  . Dyslipidemia 08/28/2018  . Erectile dysfunction 08/28/2018  . Hypertension 08/28/2018  . OSA on CPAP 02/24/2014  . Obesity, morbid (HCC) 02/24/2014  . Sleep apnea with use of continuous positive airway pressure (CPAP) 08/27/2013  . Nocturia 08/27/2013  . Obesity hypoventilation syndrome (HCC)    History reviewed. No pertinent surgical history.  Family History  Problem Relation Age of Onset  . Alzheimer's disease Mother     Medications- reviewed and updated Current Outpatient Medications  Medication Sig Dispense Refill  . simvastatin (ZOCOR) 20 MG tablet daily.     . sildenafil (REVATIO)  20 MG tablet Take 1-5 tablets (20-100 mg total) by mouth daily as needed (erectile dysfunction). 90 tablet 3   No current facility-administered medications for this visit.     Allergies-reviewed and updated No Known Allergies  Social History   Socioeconomic History  . Marital status: Legally Separated    Spouse name: Not on file  . Number of children: Not on file  . Years of education: 39  . Highest education level: Not on file  Occupational History    Employer: [employer]  Social Needs  . Financial resource strain: Not on file  . Food insecurity:    Worry: Not on file    Inability: Not on file  . Transportation needs:    Medical: Not on file    Non-medical: Not on file  Tobacco Use  . Smoking status: Never Smoker  . Smokeless tobacco: Never Used  Substance and Sexual Activity  . Alcohol use: Yes    Comment: rarely  . Drug use: No  . Sexual activity: Not on file  Lifestyle  . Physical activity:    Days per week: Not on file    Minutes per session: Not on file  . Stress: Not on file  Relationships  . Social connections:    Talks on phone: Not on file    Gets together: Not on file    Attends religious service: Not on file    Active member of club or organization: Not on file    Attends meetings of clubs or organizations: Not on file  Relationship status: Not on file  Other Topics Concern  . Not on file  Social History Narrative   Patient is single and lives alone.   Patient is working full-time.   Patient has a college education.   Patient is right-handed.   Patient drinks 3-4 cups of coffee daily.    Objective:  Physical Exam: BP 130/74 (BP Location: Left Arm, Patient Position: Sitting, Cuff Size: Large)   Pulse 79   Temp 97.8 F (36.6 C) (Oral)   Ht 5\' 8"  (1.727 m)   Wt (!) 313 lb 9.6 oz (142.2 kg)   SpO2 95%   BMI 47.68 kg/m   Gen: NAD, resting comfortably CV: RRR with no murmurs appreciated Pulm: NWOB, CTAB with no crackles, wheezes, or  rhonchi GI: Normal bowel sounds present. Soft, Nontender, Nondistended. MSK: No edema, cyanosis, or clubbing noted.  Mild fullness noted to right posterior rib wall.  Nontender to palpation. Skin: Warm, dry Neuro: Grossly normal, moves all extremities Psych: Normal affect and thought content  Assessment/Plan:  Dyslipidemia Continue simvastatin 20mg  daily.  Obtain records from previous PCP.  Erectile dysfunction Sildenafil refilled.  Hypertension At goal off meds. Discussed lifestyle modifications and home blood pressure monitoring with goal of 140/90 or lower.   Skin lump No red flag signs or symptoms.  Difficult to fully appreciate on exam due to body habitus.  Seems to be improving spontaneously.  Possibly muscular spasm.  Given the symptoms are improving spontaneously, we will not pursue further work-up at this point.  Stable with watchful waiting.  If symptoms do not resolve in the next 1 to 2 weeks or if he develops any new symptoms, or if the lump changes in size, will obtain ultrasound.  Preventative healthcare Flu shot given today.  Katina Degree. [name], MD 08/28/2018 5:22 PM

## 2018-08-28 NOTE — Assessment & Plan Note (Signed)
Sildenafil refilled

## 2018-08-28 NOTE — Assessment & Plan Note (Signed)
Continue simvastatin 20mg  daily.  Obtain records from previous PCP.

## 2018-10-02 ENCOUNTER — Ambulatory Visit (INDEPENDENT_AMBULATORY_CARE_PROVIDER_SITE_OTHER): Payer: BLUE CROSS/BLUE SHIELD | Admitting: Family Medicine

## 2018-10-02 ENCOUNTER — Encounter: Payer: Self-pay | Admitting: Family Medicine

## 2018-10-02 VITALS — BP 128/76 | HR 83 | Temp 98.6°F | Ht 68.0 in | Wt 316.2 lb

## 2018-10-02 DIAGNOSIS — J40 Bronchitis, not specified as acute or chronic: Secondary | ICD-10-CM | POA: Diagnosis not present

## 2018-10-02 MED ORDER — AZITHROMYCIN 250 MG PO TABS: ORAL_TABLET | ORAL | 0 refills | Status: DC

## 2018-10-02 NOTE — Patient Instructions (Signed)
Start the zpack.   Please stay well hydrated.  You can take tylenol and/or motrin as needed for low grade fever and pain.  Please let me know if your symptoms worsen or fail to improve.  Take care, Dr Julianne Chamberlin  

## 2018-10-02 NOTE — Progress Notes (Signed)
   Subjective:  Michael [name] is a 52 y.o. male who presents today for same-day appointment with a chief complaint of sinus congestion.   HPI:  Sinus Congestion, Acute problem Started about a week ago. Was improving, but has worsened over the couple days. Associated symptoms including cough, malaise, and yellow sputum production.  He has tried taking NyQuil and Claritin with modest improvement.  Also with some subjective fevers and chills.  Had one sick contact about a week ago.  Symptoms are worse at night.  No other obvious alleviating or aggravating factors.  ROS: Per HPI  PMH: He reports that he has never smoked. He has never used smokeless tobacco. He reports that he drinks alcohol. He reports that he does not use drugs.  Objective:  Physical Exam: BP 128/76 (BP Location: Left Arm, Patient Position: Sitting, Cuff Size: Large)   Pulse 83   Temp 98.6 F (37 C) (Oral)   Ht 5\' 8"  (1.727 m)   Wt (!) 316 lb 3.2 oz (143.4 kg)   SpO2 96%   BMI 48.08 kg/m   Gen: NAD, resting comfortably HEENT: TMs with clear effusion.  Oropharynx slightly erythematous without exudate.  Nasal mucosa boggy and erythematous with thick, white nasal discharge bilaterally. CV: RRR with no murmurs appreciated Pulm: NWOB, scattered rhonchi and expiratory wheeze.  Good airflow throughout.  Assessment/Plan:  Bronchitis No red flags.  Given symptoms been persistent for a week, we will start azithromycin.  Patient cough medication.  Declined Depo-Medrol.  Encouraged good oral hydration.  Recommended Tylenol/Motrin as needed.  Discussed reasons to return to care.  Follow-up as needed.  Katina Degreealeb M. Jimmey RalphParker, MD 10/02/2018 9:22 AM

## 2019-02-23 DIAGNOSIS — L011 Impetiginization of other dermatoses: Secondary | ICD-10-CM | POA: Diagnosis not present

## 2019-02-23 DIAGNOSIS — L308 Other specified dermatitis: Secondary | ICD-10-CM | POA: Diagnosis not present

## 2019-02-23 DIAGNOSIS — L089 Local infection of the skin and subcutaneous tissue, unspecified: Secondary | ICD-10-CM | POA: Diagnosis not present

## 2019-03-18 DIAGNOSIS — L011 Impetiginization of other dermatoses: Secondary | ICD-10-CM | POA: Diagnosis not present

## 2019-03-18 DIAGNOSIS — L301 Dyshidrosis [pompholyx]: Secondary | ICD-10-CM | POA: Diagnosis not present

## 2019-03-18 DIAGNOSIS — D2371 Other benign neoplasm of skin of right lower limb, including hip: Secondary | ICD-10-CM | POA: Diagnosis not present

## 2019-05-08 DIAGNOSIS — L089 Local infection of the skin and subcutaneous tissue, unspecified: Secondary | ICD-10-CM | POA: Diagnosis not present

## 2019-05-08 DIAGNOSIS — L301 Dyshidrosis [pompholyx]: Secondary | ICD-10-CM | POA: Diagnosis not present

## 2019-06-26 DIAGNOSIS — Z20828 Contact with and (suspected) exposure to other viral communicable diseases: Secondary | ICD-10-CM | POA: Diagnosis not present

## 2019-07-30 ENCOUNTER — Other Ambulatory Visit: Payer: Self-pay

## 2019-07-30 ENCOUNTER — Ambulatory Visit (INDEPENDENT_AMBULATORY_CARE_PROVIDER_SITE_OTHER): Payer: Self-pay | Admitting: Family Medicine

## 2019-07-30 DIAGNOSIS — R6889 Other general symptoms and signs: Secondary | ICD-10-CM | POA: Diagnosis not present

## 2019-07-30 DIAGNOSIS — Z20822 Contact with and (suspected) exposure to covid-19: Secondary | ICD-10-CM

## 2019-07-30 DIAGNOSIS — R509 Fever, unspecified: Secondary | ICD-10-CM

## 2019-07-30 DIAGNOSIS — R197 Diarrhea, unspecified: Secondary | ICD-10-CM

## 2019-07-30 NOTE — Progress Notes (Signed)
    Chief Complaint:  Michael [name] is a 52 y.o. male who presents today for a virtual office visit with a chief complaint of fever.   Assessment/Plan:  Fever/diarrhea Likely viral enteritis.  No red flags.  Recommended Pepto-Bismol and Imodium as needed.  Encouraged good oral hydration.  Given constellation of symptoms will also test for COVID-19.  Discussed reasons to return to care.  Follow-up as needed.    Subjective:  HPI:  Fever Started 2 days ago. Associated with body aches, chills, and diarrhea.  Symptoms have been stable over that time.  Is having loose stools over 30 minutes to an hour.  No obvious precipitating events.  No questionable diet intake.  No known sick contacts.  Taking Pedialyte to help with fluid loss.  No cough.  No other treatments tried.  No other obvious alleviating or aggravating factors.  No melena or hematochezia.  No abdominal pain.  ROS: Per HPI  PMH: He reports that he has never smoked. He has never used smokeless tobacco. He reports current alcohol use. He reports that he does not use drugs.      Objective/Observations  Physical Exam: Gen: NAD, resting comfortably Pulm: Normal work of breathing Neuro: Grossly normal, moves all extremities Psych: Normal affect and thought content  No results found for this or any previous visit (from the past 24 hour(s)).   Virtual Visit via Video   I connected with Michael [name] on 07/30/19 at 11:20 AM EDT by a video enabled telemedicine application and verified that I am speaking with the correct person using two identifiers. I discussed the limitations of evaluation and management by telemedicine and the availability of in person appointments. The patient expressed understanding and agreed to proceed.   Patient location: Home Provider location: [place] participating in the virtual visit: Myself and Patient     Algis Greenhouse. Jerline Pain, MD 07/30/2019 11:23 AM

## 2019-07-31 LAB — NOVEL CORONAVIRUS, NAA: SARS-CoV-2, NAA: NOT DETECTED

## 2019-07-31 NOTE — Progress Notes (Signed)
Please inform patient of the following:  COVID test is negative. Would like for him to let me know if symptoms have not improved.  Michael [name]. Jerline Pain, MD 07/31/2019 2:44 PM

## 2019-08-31 ENCOUNTER — Ambulatory Visit (INDEPENDENT_AMBULATORY_CARE_PROVIDER_SITE_OTHER): Payer: BC Managed Care – PPO | Admitting: Family Medicine

## 2019-08-31 DIAGNOSIS — L011 Impetiginization of other dermatoses: Secondary | ICD-10-CM | POA: Diagnosis not present

## 2019-08-31 DIAGNOSIS — G4700 Insomnia, unspecified: Secondary | ICD-10-CM | POA: Diagnosis not present

## 2019-08-31 DIAGNOSIS — L0889 Other specified local infections of the skin and subcutaneous tissue: Secondary | ICD-10-CM | POA: Diagnosis not present

## 2019-08-31 DIAGNOSIS — L301 Dyshidrosis [pompholyx]: Secondary | ICD-10-CM | POA: Diagnosis not present

## 2019-08-31 MED ORDER — HYDROXYZINE HCL 50 MG PO TABS: 25.0000 mg | ORAL_TABLET | Freq: Every evening | ORAL | 1 refills | Status: DC | PRN

## 2019-08-31 NOTE — Progress Notes (Signed)
    Chief Complaint:  Michael [name] is a 53 y.o. male who presents today for a virtual office visit with a chief complaint of depression.   Assessment/Plan:  Dyshidrosis Continue methotrexate per dermatology.  Will start hydroxyzine to help with insomnia and stress.  Hopefully this will also help with his pruritus.  Insomnia Given severe dyshidrosis.  Will start hydroxyzine which should hopefully help some with pruritus as well.  Discussed potential side effects.  He will follow-up with me via MyChart in a couple of weeks.    Subjective:  HPI:  Insomnia New problem.  Patient was recently started on methotrexate for dyshidrosis 3 weeks ago.  Since then he has had some increased difficulty sleeping.  He worsened after practice sleep and notes that he is on been sleeping 3 to 4 hours for the past several months.  He has also had a bit of increased stress recently as well.  He also has significant amount of pruritus on his hands, feet, arms, due to dyshidrosis.  He has not tried anything to help with itching.  Symptoms seem to be stable.  No other obvious aggravating or alleviating factors.  Depression screen PHQ 2/9 08/31/2019  Decreased Interest 1  Down, Depressed, Hopeless 0  PHQ - 2 Score 1  Altered sleeping 3  Tired, decreased energy 3  Change in appetite 1  Feeling bad or failure about yourself  0  Trouble concentrating 2  Moving slowly or fidgety/restless 0  Suicidal thoughts 0  PHQ-9 Score 10  Difficult doing work/chores Extremely dIfficult   ROS: Per HPI  PMH: He reports that he has never smoked. He has never used smokeless tobacco. He reports current alcohol use. He reports that he does not use drugs.      Objective/Observations  Physical Exam: Gen: NAD, resting comfortably Pulm: Normal work of breathing Neuro: Grossly normal, moves all extremities Psych: Normal affect and thought content  No results found for this or any previous visit (from the past 24 hour(s)).    Virtual Visit via Video   I connected with Michael [name] on 08/31/19 at  3:40 PM EDT by a video enabled telemedicine application and verified that I am speaking with the correct person using two identifiers. I discussed the limitations of evaluation and management by telemedicine and the availability of in person appointments. The patient expressed understanding and agreed to proceed.   Patient location: Home Provider location: [place] participating in the virtual visit: Myself and Patient     Algis Greenhouse. Jerline Pain, MD 08/31/2019 3:58 PM

## 2019-08-31 NOTE — Assessment & Plan Note (Signed)
Given severe dyshidrosis.  Will start hydroxyzine which should hopefully help some with pruritus as well.  Discussed potential side effects.  He will follow-up with me via MyChart in a couple of weeks.

## 2019-08-31 NOTE — Assessment & Plan Note (Addendum)
Continue methotrexate per dermatology.  Will start hydroxyzine to help with insomnia and stress.  Hopefully this will also help with his pruritus.

## 2019-09-14 DIAGNOSIS — Z79899 Other long term (current) drug therapy: Secondary | ICD-10-CM | POA: Diagnosis not present

## 2019-09-14 DIAGNOSIS — L301 Dyshidrosis [pompholyx]: Secondary | ICD-10-CM | POA: Diagnosis not present

## 2019-09-14 DIAGNOSIS — L400 Psoriasis vulgaris: Secondary | ICD-10-CM | POA: Diagnosis not present

## 2019-09-22 ENCOUNTER — Other Ambulatory Visit: Payer: Self-pay | Admitting: Family Medicine

## 2019-09-28 DIAGNOSIS — Z79899 Other long term (current) drug therapy: Secondary | ICD-10-CM | POA: Diagnosis not present

## 2019-10-26 DIAGNOSIS — L0889 Other specified local infections of the skin and subcutaneous tissue: Secondary | ICD-10-CM | POA: Diagnosis not present

## 2019-10-26 DIAGNOSIS — Z79899 Other long term (current) drug therapy: Secondary | ICD-10-CM | POA: Diagnosis not present

## 2019-10-26 DIAGNOSIS — L301 Dyshidrosis [pompholyx]: Secondary | ICD-10-CM | POA: Diagnosis not present

## 2019-10-26 DIAGNOSIS — L011 Impetiginization of other dermatoses: Secondary | ICD-10-CM | POA: Diagnosis not present

## 2019-11-03 ENCOUNTER — Telehealth: Payer: Self-pay | Admitting: Family Medicine

## 2019-11-03 NOTE — Telephone Encounter (Signed)
Rx Request 

## 2019-11-03 NOTE — Telephone Encounter (Signed)
sildenafil (REVATIO) 20 MG tablet [[id number]] DISCONTINUED [id number] [address], [place] - [place] Phone:  [phone]  Fax:  [phone]     If not filled please call pt back, pt states that he did not want to discont, always to be available

## 2019-11-03 NOTE — Telephone Encounter (Signed)
See note

## 2019-11-04 ENCOUNTER — Other Ambulatory Visit: Payer: Self-pay

## 2019-11-04 MED ORDER — SILDENAFIL CITRATE 20 MG PO TABS: 20.0000 mg | ORAL_TABLET | Freq: Every day | ORAL | 1 refills | Status: DC | PRN

## 2019-11-04 NOTE — Telephone Encounter (Signed)
Rx sent 

## 2019-11-04 NOTE — Telephone Encounter (Signed)
Ok with me. Please place any necessary orders. 

## 2019-11-11 ENCOUNTER — Other Ambulatory Visit: Payer: Self-pay

## 2019-11-11 MED ORDER — SILDENAFIL CITRATE 20 MG PO TABS: 20.0000 mg | ORAL_TABLET | Freq: Every day | ORAL | 1 refills | Status: DC | PRN

## 2019-11-11 NOTE — Telephone Encounter (Signed)
See note  Copied from [place] [id number]. Topic: General - Other >> Nov 11, 2019 12:41 PM Leward Quan A wrote: Reason for CRM: Patient called in reference to Rx for sildenafil (REVATIO) 20 MG tablet per pharmacy Rx was received but need a prior authorization. Please advise Ph# [phone]

## 2019-11-11 NOTE — Telephone Encounter (Signed)
Rx sent 

## 2019-12-09 ENCOUNTER — Ambulatory Visit: Payer: BC Managed Care – PPO | Attending: Internal Medicine

## 2019-12-09 DIAGNOSIS — Z20822 Contact with and (suspected) exposure to covid-19: Secondary | ICD-10-CM

## 2019-12-10 LAB — NOVEL CORONAVIRUS, NAA: SARS-CoV-2, NAA: NOT DETECTED

## 2019-12-16 DIAGNOSIS — Z1152 Encounter for screening for COVID-19: Secondary | ICD-10-CM | POA: Diagnosis not present

## 2019-12-16 DIAGNOSIS — J069 Acute upper respiratory infection, unspecified: Secondary | ICD-10-CM | POA: Diagnosis not present

## 2019-12-16 DIAGNOSIS — R05 Cough: Secondary | ICD-10-CM | POA: Diagnosis not present

## 2020-01-21 ENCOUNTER — Other Ambulatory Visit: Payer: Self-pay

## 2020-01-21 MED ORDER — SILDENAFIL CITRATE 20 MG PO TABS: 20.0000 mg | ORAL_TABLET | Freq: Every day | ORAL | 1 refills | Status: AC | PRN

## 2020-01-25 DIAGNOSIS — Z79899 Other long term (current) drug therapy: Secondary | ICD-10-CM | POA: Diagnosis not present

## 2020-01-25 DIAGNOSIS — L0889 Other specified local infections of the skin and subcutaneous tissue: Secondary | ICD-10-CM | POA: Diagnosis not present

## 2020-01-25 DIAGNOSIS — L301 Dyshidrosis [pompholyx]: Secondary | ICD-10-CM | POA: Diagnosis not present

## 2020-02-09 ENCOUNTER — Telehealth: Payer: Self-pay | Admitting: Family Medicine

## 2020-02-09 NOTE — Telephone Encounter (Signed)
Patient states he is schedule to get COVID vac in the morning.  States he is currently taking methotrexate and Cephalexin.  Would like to know if Dr. [name] would think it would be ok to still get the vac?

## 2020-02-10 NOTE — Telephone Encounter (Signed)
Patient notified and voices understanding 

## 2020-02-10 NOTE — Telephone Encounter (Signed)
Should be fine - recommend he also check with his dermatologist as well.  Katina Degree. [name], MD 02/10/2020 8:26 AM

## 2020-02-10 NOTE — Telephone Encounter (Signed)
Please advise 

## 2020-03-17 ENCOUNTER — Encounter: Payer: Self-pay | Admitting: Sports Medicine

## 2020-03-17 ENCOUNTER — Ambulatory Visit: Payer: BC Managed Care – PPO | Admitting: Sports Medicine

## 2020-03-17 ENCOUNTER — Other Ambulatory Visit: Payer: Self-pay

## 2020-03-17 DIAGNOSIS — M79675 Pain in left toe(s): Secondary | ICD-10-CM | POA: Diagnosis not present

## 2020-03-17 DIAGNOSIS — B351 Tinea unguium: Secondary | ICD-10-CM

## 2020-03-17 DIAGNOSIS — L601 Onycholysis: Secondary | ICD-10-CM | POA: Diagnosis not present

## 2020-03-17 DIAGNOSIS — Z872 Personal history of diseases of the skin and subcutaneous tissue: Secondary | ICD-10-CM | POA: Diagnosis not present

## 2020-03-17 DIAGNOSIS — L603 Nail dystrophy: Secondary | ICD-10-CM | POA: Diagnosis not present

## 2020-03-17 MED ORDER — NEOMYCIN-POLYMYXIN-HC 3.5-10000-1 OT SOLN: OTIC | 0 refills | Status: DC

## 2020-03-17 NOTE — Progress Notes (Signed)
Subjective: Michael [name] is a 54 y.o. male patient presents to office today complaining of a moderately painful lifting left first toenail that has been present for the last 1-1/2 years.reports that it has slowly gotten a little very thick discolored reports that there is only pain with pressure or when the nail feels like it is about to left reports that he has a history of eczema currently being being treated by his dermatologist has tried Epson salt and triamcinolone cream to the area.  Denies any injury redness swelling or drainage from the left great toe.  Patient denies fever/chills/nausea/vomitting/any other related constitutional symptoms at this time.   Review of Systems  All other systems reviewed and are negative.   Patient Active Problem List   Diagnosis Date Noted  . Dyshidrosis 08/31/2019  . Insomnia 08/31/2019  . Dyslipidemia 08/28/2018  . Erectile dysfunction 08/28/2018  . Hypertension 08/28/2018  . OSA on CPAP 02/24/2014  . Obesity, morbid (HCC) 02/24/2014  . Sleep apnea with use of continuous positive airway pressure (CPAP) 08/27/2013  . Nocturia 08/27/2013  . Obesity hypoventilation syndrome (HCC)     Current Outpatient Medications on File Prior to Visit  Medication Sig Dispense Refill  . folic acid (FOLVITE) 1 MG tablet Take 1 mg by mouth daily.    . hydrOXYzine (ATARAX/VISTARIL) 50 MG tablet TAKE 1/2-2 TABLETS BY MOUTH AT BEDTIME AS NEEDED (INSOMNIA). 180 tablet 1  . methotrexate (RHEUMATREX) 2.5 MG tablet     . sildenafil (REVATIO) 20 MG tablet Take 1 tablet (20 mg total) by mouth daily as needed (Take 1 - 2 tablets daily PRN). 90 tablet 1   No current facility-administered medications on file prior to visit.    No Known Allergies  Objective:  There were no vitals filed for this visit.  General: Well developed, nourished, in no acute distress, alert and oriented x3   Dermatology: Skin is warm, dry and supple bilateral.  Left hallux nail appears to be  partially attached and severely thickened with hyper pigmentation (+) blanchable erythema. (-) Edema. (-) serosanguous  drainage present. The remaining nails appear unremarkable at this time.  Scaly patches bilateral feet and hands history of eczema.  There are no open sores, lesions or other signs of infection  present.  Vascular: Dorsalis Pedis artery and Posterior Tibial artery pedal pulses are 2/4 bilateral with immedate capillary fill time. Pedal hair growth present. No lower extremity edema.   Neruologic: Grossly intact via light touch bilateral.  Musculoskeletal: Tenderness to palpation of the left hallux nail.  Muscular strength within normal limits in all groups bilateral.   Assesement and Plan: Problem List Items Addressed This Visit    None    Visit Diagnoses    Nail fungus    -  Primary   Relevant Orders   Culture, fungus without smear   Onycholysis       Toe pain, left       History of eczema          -Discussed treatment alternatives and plan of care; Explained permanent/temporary nail avulsion and post procedure course to patient. Patient elects for temporary removal of left hallux nail and fungal culture analysis - After a verbal and written consent, injected 3 ml of a 50:50 mixture of 2% plain lidocaine and 0.5% plain marcaine in a normal hallux block fashion. Next, a betadine prep was performed. Anesthesia was tested and found to be appropriate.  The offending left hallux nail completely was then incised from  the hyponychium to the epinychium. The area was curretted for any remaining nail or spicules.  Lumicain applied for hemostasis. Then flushed with alcohol and dressed with antibiotic cream and a dry sterile dressing. -Patient was instructed to leave the dressing intact for today and begin soaking in a weak solution of betadine or Epsom salt and water tomorrow. Patient was instructed to soak for 15-20 minutes each day and apply neosporin/corticosporin and a gauze or  bandaid dressing each day. -Patient was instructed to monitor the toe for signs of infection and return to office if toe becomes red, hot or swollen. -Advised ice, elevation, and tylenol or motrin if needed for pain.  -Patient is to return in 2 to 3 weeks for follow up care/nail check and discussion of fungal culture results or sooner if problems arise.  Landis Martins, DPM

## 2020-03-17 NOTE — Patient Instructions (Signed)

## 2020-03-24 ENCOUNTER — Other Ambulatory Visit: Payer: Self-pay | Admitting: Family Medicine

## 2020-04-06 ENCOUNTER — Ambulatory Visit: Payer: BC Managed Care – PPO | Admitting: Sports Medicine

## 2020-04-06 ENCOUNTER — Other Ambulatory Visit: Payer: Self-pay

## 2020-04-06 DIAGNOSIS — B351 Tinea unguium: Secondary | ICD-10-CM

## 2020-04-06 DIAGNOSIS — Z872 Personal history of diseases of the skin and subcutaneous tissue: Secondary | ICD-10-CM

## 2020-04-06 DIAGNOSIS — M722 Plantar fascial fibromatosis: Secondary | ICD-10-CM | POA: Diagnosis not present

## 2020-04-06 DIAGNOSIS — Z9889 Other specified postprocedural states: Secondary | ICD-10-CM

## 2020-04-06 DIAGNOSIS — M79672 Pain in left foot: Secondary | ICD-10-CM | POA: Diagnosis not present

## 2020-04-06 MED ORDER — NEOMYCIN-POLYMYXIN-HC 3.5-10000-1 OT SOLN
OTIC | 2 refills | Status: DC
Start: 2020-04-06 — End: 2020-05-21

## 2020-04-06 NOTE — Progress Notes (Signed)
Subjective: Michael [name] is a 54 y.o. male patient returns to office today for follow up evaluation after having left hallux temporary nail removal performed on 03/17/2020. Patient has been soaking using epsom salt and applying topical antibiotic Neosporin covered with bandaid daily. Patient deniesfever/chills/nausea/vomitting/any other related constitutional symptoms at this time.  Reports that he also has some occasional pain at the arch and the ball of the foot not sure if it is related to the eczema or any deeper pain.  No other pedal complaints noted.  Patient Active Problem List   Diagnosis Date Noted  . Dyshidrosis 08/31/2019  . Insomnia 08/31/2019  . Dyslipidemia 08/28/2018  . Erectile dysfunction 08/28/2018  . Hypertension 08/28/2018  . OSA on CPAP 02/24/2014  . Obesity, morbid (HCC) 02/24/2014  . Sleep apnea with use of continuous positive airway pressure (CPAP) 08/27/2013  . Nocturia 08/27/2013  . Obesity hypoventilation syndrome (HCC)     Current Outpatient Medications on File Prior to Visit  Medication Sig Dispense Refill  . folic acid (FOLVITE) 1 MG tablet Take 1 mg by mouth daily.    . hydrOXYzine (ATARAX/VISTARIL) 50 MG tablet TAKE 1/2-2 TABLETS BY MOUTH AT BEDTIME AS NEEDED (INSOMNIA). 180 tablet 1  . methotrexate (RHEUMATREX) 2.5 MG tablet     . sildenafil (REVATIO) 20 MG tablet Take 1 tablet (20 mg total) by mouth daily as needed (Take 1 - 2 tablets daily PRN). 90 tablet 1   No current facility-administered medications on file prior to visit.    No Known Allergies  Objective:  General: Well developed, nourished, in no acute distress, alert and oriented x3   Dermatology: Skin is warm, dry and supple bilateral. Left hallux nail bed appears to be clean, dry, with mild granular tissue and surrounding eschar/scab. (-) Erythema. (-) Edema. (-) serosanguous drainage present. The remaining nails appear unremarkable at this time. There are no other lesions or other signs of  infection  present.  Eczema patches plantar surface of entire foot worse on left.  Neurovascular status: Intact. No lower extremity swelling; No pain with calf compression bilateral.  Musculoskeletal: Decreased tenderness to palpation of the left hallux. Muscular strength within normal limits bilateral.  Pain with palpation to arch and forefoot at areas of eczema lesions possible plantar fascial versus eczema  Assesement and Plan: Problem List Items Addressed This Visit    None    Visit Diagnoses    Nail fungus    -  Primary   Relevant Orders   Hepatic Function Panel   S/P nail surgery       History of eczema       Left foot pain       Plantar fasciitis, left       Possible       -Examined patient  -Fungal culture results reviewed -Discussed plan of care with patient. -Applied Band-Aid to left hallux nail bed advised patient since area is well healed may discontinue soaking and may discontinue Neosporin -Advised patient since his culture is positive for fungus and bacteria recommend topical polymyxin to the skin for the bacteria and recommend laser treatment or oral Lamisil.  Patient elects to try oral Lamisil liver function tests were ordered advised patient if tests are normal we will proceed with sending oral Lamisil to his pharmacy.  I also recommended for him to talk with his dermatologist who has been on methotrexate for his eczema to also make sure this medication is safe to take while he is on methotrexate -  Educated patient on long term care after nail surgery. -Patient was instructed to monitor the toe for reoccurrence and signs of infection; Patient advised to return to office or go to ER if toe becomes red, hot or swollen. -Advised patient for occasional pain in the arch and ball to do some stretching as well as icing however there is some eczema skin lesions at this area so this still may be related to his eczema -Patient is to return in 6 weeks or sooner if problems  arise.  Landis Martins, DPM

## 2020-04-08 DIAGNOSIS — B351 Tinea unguium: Secondary | ICD-10-CM | POA: Diagnosis not present

## 2020-04-09 LAB — HEPATIC FUNCTION PANEL
ALT: 31 IU/L (ref 0–44)
AST: 26 IU/L (ref 0–40)
Albumin: 4.5 g/dL (ref 3.8–4.9)
Alkaline Phosphatase: 71 IU/L (ref 48–121)
Bilirubin Total: 0.5 mg/dL (ref 0.0–1.2)
Bilirubin, Direct: 0.13 mg/dL (ref 0.00–0.40)
Total Protein: 7.4 g/dL (ref 6.0–8.5)

## 2020-04-15 ENCOUNTER — Telehealth: Payer: Self-pay

## 2020-04-15 ENCOUNTER — Other Ambulatory Visit: Payer: Self-pay | Admitting: Sports Medicine

## 2020-04-15 MED ORDER — TERBINAFINE HCL 250 MG PO TABS: 250.0000 mg | ORAL_TABLET | Freq: Every day | ORAL | 0 refills | Status: DC

## 2020-04-15 NOTE — Progress Notes (Signed)
Reviewed blood work with patient LFTs are normal we will proceed with sending oral Lamisil to his pharmacy advised patient to take 1 pill a day and when he returns to office on July 1 we will reevaluate his skin and nails and at that point will make a decision if we need to add on laser treatment for his nails. -Dr. Marylene Land

## 2020-04-15 NOTE — Telephone Encounter (Signed)
Pt called wanting results and Dr's recommendations or Rx

## 2020-04-30 ENCOUNTER — Other Ambulatory Visit: Payer: Self-pay | Admitting: Family Medicine

## 2020-05-02 ENCOUNTER — Other Ambulatory Visit: Payer: Self-pay | Admitting: *Deleted

## 2020-05-02 DIAGNOSIS — L0889 Other specified local infections of the skin and subcutaneous tissue: Secondary | ICD-10-CM | POA: Diagnosis not present

## 2020-05-02 DIAGNOSIS — L011 Impetiginization of other dermatoses: Secondary | ICD-10-CM | POA: Diagnosis not present

## 2020-05-02 DIAGNOSIS — L301 Dyshidrosis [pompholyx]: Secondary | ICD-10-CM | POA: Diagnosis not present

## 2020-05-19 ENCOUNTER — Emergency Department (HOSPITAL_COMMUNITY): Payer: BC Managed Care – PPO

## 2020-05-19 ENCOUNTER — Inpatient Hospital Stay (HOSPITAL_COMMUNITY)
Admission: EM | Admit: 2020-05-19 | Discharge: 2020-05-21 | DRG: 246 | Disposition: A | Discharge status: Home / Self Care | Payer: BC Managed Care – PPO | Attending: Cardiology | Admitting: Cardiology

## 2020-05-19 ENCOUNTER — Encounter: Payer: Self-pay | Admitting: Sports Medicine

## 2020-05-19 ENCOUNTER — Encounter (HOSPITAL_COMMUNITY): Payer: Self-pay | Admitting: Emergency Medicine

## 2020-05-19 ENCOUNTER — Other Ambulatory Visit: Payer: Self-pay

## 2020-05-19 ENCOUNTER — Ambulatory Visit: Payer: BC Managed Care – PPO | Admitting: Sports Medicine

## 2020-05-19 DIAGNOSIS — Z8661 Personal history of infections of the central nervous system: Secondary | ICD-10-CM | POA: Diagnosis not present

## 2020-05-19 DIAGNOSIS — B351 Tinea unguium: Secondary | ICD-10-CM | POA: Diagnosis not present

## 2020-05-19 DIAGNOSIS — S90421A Blister (nonthermal), right great toe, initial encounter: Secondary | ICD-10-CM

## 2020-05-19 DIAGNOSIS — L301 Dyshidrosis [pompholyx]: Secondary | ICD-10-CM | POA: Diagnosis not present

## 2020-05-19 DIAGNOSIS — I214 Non-ST elevation (NSTEMI) myocardial infarction: Secondary | ICD-10-CM | POA: Diagnosis not present

## 2020-05-19 DIAGNOSIS — I110 Hypertensive heart disease with heart failure: Secondary | ICD-10-CM | POA: Diagnosis present

## 2020-05-19 DIAGNOSIS — G4733 Obstructive sleep apnea (adult) (pediatric): Secondary | ICD-10-CM

## 2020-05-19 DIAGNOSIS — J9811 Atelectasis: Secondary | ICD-10-CM | POA: Diagnosis not present

## 2020-05-19 DIAGNOSIS — R079 Chest pain, unspecified: Secondary | ICD-10-CM | POA: Diagnosis not present

## 2020-05-19 DIAGNOSIS — Z872 Personal history of diseases of the skin and subcutaneous tissue: Secondary | ICD-10-CM

## 2020-05-19 DIAGNOSIS — E662 Morbid (severe) obesity with alveolar hypoventilation: Secondary | ICD-10-CM | POA: Diagnosis present

## 2020-05-19 DIAGNOSIS — M79674 Pain in right toe(s): Secondary | ICD-10-CM

## 2020-05-19 DIAGNOSIS — R0789 Other chest pain: Secondary | ICD-10-CM | POA: Diagnosis not present

## 2020-05-19 DIAGNOSIS — Z79899 Other long term (current) drug therapy: Secondary | ICD-10-CM | POA: Diagnosis not present

## 2020-05-19 DIAGNOSIS — E785 Hyperlipidemia, unspecified: Secondary | ICD-10-CM

## 2020-05-19 DIAGNOSIS — M79605 Pain in left leg: Secondary | ICD-10-CM | POA: Diagnosis not present

## 2020-05-19 DIAGNOSIS — I5031 Acute diastolic (congestive) heart failure: Secondary | ICD-10-CM | POA: Diagnosis not present

## 2020-05-19 DIAGNOSIS — Z6841 Body Mass Index (BMI) 40.0 and over, adult: Secondary | ICD-10-CM

## 2020-05-19 DIAGNOSIS — Z955 Presence of coronary angioplasty implant and graft: Secondary | ICD-10-CM

## 2020-05-19 DIAGNOSIS — I2510 Atherosclerotic heart disease of native coronary artery without angina pectoris: Secondary | ICD-10-CM

## 2020-05-19 DIAGNOSIS — M79672 Pain in left foot: Secondary | ICD-10-CM

## 2020-05-19 DIAGNOSIS — I5030 Unspecified diastolic (congestive) heart failure: Secondary | ICD-10-CM

## 2020-05-19 DIAGNOSIS — R0602 Shortness of breath: Secondary | ICD-10-CM | POA: Diagnosis not present

## 2020-05-19 DIAGNOSIS — I10 Essential (primary) hypertension: Secondary | ICD-10-CM | POA: Diagnosis present

## 2020-05-19 DIAGNOSIS — Z20822 Contact with and (suspected) exposure to covid-19: Secondary | ICD-10-CM | POA: Diagnosis present

## 2020-05-19 DIAGNOSIS — R918 Other nonspecific abnormal finding of lung field: Secondary | ICD-10-CM | POA: Diagnosis not present

## 2020-05-19 HISTORY — DX: Unspecified diastolic (congestive) heart failure: I50.30

## 2020-05-19 HISTORY — DX: Atherosclerotic heart disease of native coronary artery without angina pectoris: I25.10

## 2020-05-19 LAB — BASIC METABOLIC PANEL
Anion gap: 10 (ref 5–15)
BUN: 14 mg/dL (ref 6–20)
CO2: 24 mmol/L (ref 22–32)
Calcium: 9.5 mg/dL (ref 8.9–10.3)
Chloride: 106 mmol/L (ref 98–111)
Creatinine, Ser: 0.93 mg/dL (ref 0.61–1.24)
GFR calc Af Amer: >60 mL/min (ref >60)
GFR calc non Af Amer: >60 mL/min (ref >60)
Glucose, Bld: 109 mg/dL — ABNORMAL HIGH (ref 70–99)
Potassium: 3.8 mmol/L (ref 3.5–5.1)
Sodium: 140 mmol/L (ref 135–145)

## 2020-05-19 LAB — TROPONIN I (HIGH SENSITIVITY)
Troponin I (High Sensitivity): 12 ng/L (ref <18)
Troponin I (High Sensitivity): 437 ng/L (ref <18)

## 2020-05-19 LAB — CBC
HCT: 47.5 % (ref 39.0–52.0)
Hemoglobin: 15.6 g/dL (ref 13.0–17.0)
MCH: 28.3 pg (ref 26.0–34.0)
MCHC: 32.8 g/dL (ref 30.0–36.0)
MCV: 86.1 fL (ref 80.0–100.0)
Platelets: 188 10*3/uL (ref 150–400)
RBC: 5.52 MIL/uL (ref 4.22–5.81)
RDW: 14.3 % (ref 11.5–15.5)
WBC: 8.1 10*3/uL (ref 4.0–10.5)
nRBC: 0 % (ref 0.0–0.2)

## 2020-05-19 MED ORDER — IOHEXOL 350 MG/ML SOLN
100.0000 mL | Freq: Once | INTRAVENOUS | Status: AC | PRN
  Administered 2020-05-19: 100 mL via INTRAVENOUS

## 2020-05-19 MED ORDER — ASPIRIN EC 325 MG PO TBEC
325.0000 mg | DELAYED_RELEASE_TABLET | Freq: Once | ORAL | Status: AC
  Administered 2020-05-19: 325 mg via ORAL
  Filled 2020-05-19: qty 1

## 2020-05-19 MED ORDER — SODIUM CHLORIDE 0.9% FLUSH: 3.0000 mL | Freq: Once | INTRAVENOUS | Status: DC

## 2020-05-19 MED ORDER — HEPARIN SODIUM (PORCINE) 5000 UNIT/ML IJ SOLN: 60.0000 [IU]/kg | Freq: Once | INTRAMUSCULAR | Status: DC

## 2020-05-19 NOTE — Progress Notes (Signed)
Subjective: Michael [name] is a 54 y.o. male patient seen today in office for follow up evaluation of nail fungus on Lamisil. Patient states that when he first took the medication he had a flareup of his eczema but then saw his dermatologist who felt like there was no reason to discontinue the medication so he has been taking it without any other flareups or problems reports that he has not noticed a change yet with the regrowth of his left great toenail and reports that it seems like the right great toenail has some increased yellowing and also admits to doing a lot of walking on yesterday and rubbing a blister or a sore spot on the plantar aspect of the right great toe.  Patient has no other pedal complaints at this time.   Patient Active Problem List   Diagnosis Date Noted  . Dyshidrosis 08/31/2019  . Insomnia 08/31/2019  . Dyslipidemia 08/28/2018  . Erectile dysfunction 08/28/2018  . Hypertension 08/28/2018  . OSA on CPAP 02/24/2014  . Obesity, morbid (HCC) 02/24/2014  . Sleep apnea with use of continuous positive airway pressure (CPAP) 08/27/2013  . Nocturia 08/27/2013  . Obesity hypoventilation syndrome (HCC)     Current Outpatient Medications on File Prior to Visit  Medication Sig Dispense Refill  . folic acid (FOLVITE) 1 MG tablet Take 1 mg by mouth daily.    . hydrOXYzine (ATARAX/VISTARIL) 50 MG tablet TAKE 1/2-2 TABLETS BY MOUTH AT BEDTIME AS NEEDED (INSOMNIA). 180 tablet 1  . methotrexate (RHEUMATREX) 2.5 MG tablet     . neomycin-polymyxin-hydrocortisone (CORTISPORIN) OTIC solution Apply 1-2 drops to left toe at bedtime for bacterial infection 10 mL 2  . sildenafil (REVATIO) 20 MG tablet TAKE 1-2 TABS BY MOUTH DAILY AS NEEDED. 90 tablet 1  . terbinafine (LAMISIL) 250 MG tablet Take 1 tablet (250 mg total) by mouth daily. 90 tablet 0   No current facility-administered medications on file prior to visit.    No Known Allergies  Objective: Physical Exam  General: Well  developed, nourished, no acute distress, awake, alert and oriented x 3  Vascular: Dorsalis pedis artery 1 4 bilateral, Posterior tibial artery 1/4 bilateral, skin temperature warm to warm proximal to distal bilateral lower extremities, mild varicosities, pedal hair present bilateral.  Neurological: Gross sensation present via light touch bilateral.   Dermatological: Skin is warm, dry, and supple bilateral, nails are tender, short thick, and discolored with mild subungal debris and no obvious early clearance noted at proximal nail bed, no regrowth yet noted at the left hallux nail from previous removal, no webspace macerations present bilateral, blood blister noted to plantar right hallux with no signs of infection, no open lesions present bilateral, no callus/corns/hyperkeratotic tissue present bilateral.  Dry scaly skin history of eczema plantar surfaces of both feet.  No signs of infection bilateral.  Musculoskeletal: Asymptomatic pes planus boney deformities noted bilateral. Muscular strength within normal limits without painon range of motion. No pain with calf compression bilateral.  Assessment and Plan:  Problem List Items Addressed This Visit    None    Visit Diagnoses    Encounter for long-term (current) use of high-risk medication    -  Primary   Relevant Orders   Hepatic Function Panel   Nail fungus       History of eczema       Left foot pain       Toe pain, right       Blister of great toe of right  foot, initial encounter          -Examined patient -Cont with Lamisil; a new set of LFTs were ordered; will call patient to stop medication if abnormal  -Advised good hygiene habits and educated patient on proper foot care to prevent re-infection -Advised patient that it would take longer to see any nail regrowth happening as when this occurs can start laser for the left great toenail however at this time continue with oral Lamisil as above -After Betadine prep use a 11 blade and  lanced blister at right great toe draining clear to bloody fluid from this area and then applied antibiotic cream and a gauze and Band-Aid dressing and advised patient to continue same daily closely monitor if there are worsening signs or symptoms of infection to call office for oral antibiotic however due to the superficial nature of this blister at this time there is no acute indication or need for any oral antibiotic -Patient to return in 6 weeks for follow up evaluation or sooner if symptoms worsen.  Asencion Islam, DPM

## 2020-05-19 NOTE — ED Triage Notes (Signed)
Pt here with c/o chest pain and sob , pt just drove back from [place] yesterday , pt is hypertensive in triage and not on b/p meds

## 2020-05-19 NOTE — Progress Notes (Signed)
ANTICOAGULATION CONSULT NOTE - Initial Consult  Pharmacy Consult for Heparin Indication: chest pain/ACS  No Known Allergies  Patient Measurements:   Heparin Dosing Weight: 100 kg  Vital Signs: Temp: 98.9 F (37.2 C) (07/01 1634) Temp Source: Oral (07/01 1634) BP: 171/105 (07/01 2332) Pulse Rate: 71 (07/01 2332)  Labs: Recent Labs    05/19/20 1639 05/19/20 1952  HGB 15.6  --   HCT 47.5  --   PLT 188  --   CREATININE 0.93  --   TROPONINIHS 12 437*    CrCl cannot be calculated (Unknown ideal weight.).   Medical History: Past Medical History:  Diagnosis Date  . Obesity hypoventilation syndrome (HCC)   . Sleep apnea   . Sleep apnea with use of continuous positive airway pressure (CPAP) 08/27/2013  . Viral meningitis     Medications:  No current facility-administered medications on file prior to encounter.   Current Outpatient Medications on File Prior to Encounter  Medication Sig Dispense Refill  . folic acid (FOLVITE) 1 MG tablet Take 1 mg by mouth daily.    . hydrOXYzine (ATARAX/VISTARIL) 50 MG tablet TAKE 1/2-2 TABLETS BY MOUTH AT BEDTIME AS NEEDED (INSOMNIA). 180 tablet 1  . methotrexate (RHEUMATREX) 2.5 MG tablet     . neomycin-polymyxin-hydrocortisone (CORTISPORIN) OTIC solution Apply 1-2 drops to left toe at bedtime for bacterial infection 10 mL 2  . sildenafil (REVATIO) 20 MG tablet TAKE 1-2 TABS BY MOUTH DAILY AS NEEDED. 90 tablet 1  . terbinafine (LAMISIL) 250 MG tablet Take 1 tablet (250 mg total) by mouth daily. 90 tablet 0     Assessment: 54 y.o. male with chest pain for heparin  Goal of Therapy:  Heparin level 0.3-0.7 units/ml Monitor platelets by anticoagulation protocol: Yes   Plan:  Heparin 4000 units IV bolus, then start heparin 1700 units/hr Check heparin level in 6 hours.   Eddie Candle 05/19/2020,11:58 PM

## 2020-05-19 NOTE — ED Provider Notes (Signed)
[facility] EMERGENCY DEPARTMENT Provider Note   CSN: [id number] Arrival date & time: 05/19/20  1624     History No chief complaint on file.   Michael [name] is a 54 y.o. male.  The history is provided by the patient and the spouse.  Chest Pain Pain location:  Substernal area Pain quality comment:  "wringing out mucles" Pain radiates to:  L shoulder Pain severity:  Severe Onset quality:  Sudden (onset at 3:30pm) Progression:  Partially resolved (pain now 3/10) Chronicity:  New Context: at rest   Context: not breathing, not intercourse, not lifting, not movement, not raising an arm and not trauma   Context comment:  Pain onset during business meeting Associated symptoms: heartburn, lower extremity edema and shortness of breath   Associated symptoms: no abdominal pain, no altered mental status, no back pain, no cough, no diaphoresis, no dizziness, no fatigue, no fever, no headache, no nausea, no numbness, no orthopnea, no palpitations, no syncope, no vomiting and no weakness   Risk factors: hypertension, male sex and obesity   Risk factors: no coronary artery disease, no diabetes mellitus, no prior DVT/PE and no surgery   Risk factors comment:  Pt endorses recent long (11hr) car trip on monday      Past Medical History:  Diagnosis Date  . Obesity hypoventilation syndrome (HCC)   . Sleep apnea   . Sleep apnea with use of continuous positive airway pressure (CPAP) 08/27/2013  . Viral meningitis     Patient Active Problem List   Diagnosis Date Noted  . Dyshidrosis 08/31/2019  . Insomnia 08/31/2019  . Dyslipidemia 08/28/2018  . Erectile dysfunction 08/28/2018  . Hypertension 08/28/2018  . OSA on CPAP 02/24/2014  . Obesity, morbid (HCC) 02/24/2014  . Sleep apnea with use of continuous positive airway pressure (CPAP) 08/27/2013  . Nocturia 08/27/2013  . Obesity hypoventilation syndrome (HCC)     History reviewed. No pertinent surgical  history.     Family History  Problem Relation Age of Onset  . Alzheimer's disease Mother     Social History   Tobacco Use  . Smoking status: Never Smoker  . Smokeless tobacco: Never Used  Substance Use Topics  . Alcohol use: Yes    Comment: rarely  . Drug use: No    Home Medications Prior to Admission medications   Medication Sig Start Date End Date Taking? Authorizing Provider  folic acid (FOLVITE) 1 MG tablet Take 1 mg by mouth daily. 02/24/20   [provider]  hydrOXYzine (ATARAX/VISTARIL) 50 MG tablet TAKE 1/2-2 TABLETS BY MOUTH AT BEDTIME AS NEEDED (INSOMNIA). 03/24/20   Ardith Dark, MD  methotrexate [facility]) 2.5 MG tablet  08/31/19   [provider]  neomycin-polymyxin-hydrocortisone (CORTISPORIN) OTIC solution Apply 1-2 drops to left toe at bedtime for bacterial infection 04/06/20   Asencion Islam, DPM  sildenafil (REVATIO) 20 MG tablet TAKE 1-2 TABS BY MOUTH DAILY AS NEEDED. 05/02/20   Ardith Dark, MD  terbinafine (LAMISIL) 250 MG tablet Take 1 tablet (250 mg total) by mouth daily. 04/15/20   Asencion Islam, DPM    Allergies    Patient has no known allergies.  Review of Systems   Review of Systems  Constitutional: Negative for diaphoresis, fatigue and fever.  HENT: Negative for congestion, rhinorrhea and sore throat.   Respiratory: Positive for shortness of breath. Negative for cough.   Cardiovascular: Positive for chest pain. Negative for palpitations, orthopnea and syncope.  Gastrointestinal: Positive for heartburn. Negative  for abdominal pain, blood in stool, nausea and vomiting.  Genitourinary: Negative for dysuria and hematuria.  Musculoskeletal: Positive for myalgias. Negative for back pain.  Skin: Negative for rash and wound.  Neurological: Negative for dizziness, weakness, numbness and headaches.  Psychiatric/Behavioral: Negative.     Physical Exam Updated Vital Signs BP (!) 171/105   Pulse 71   Temp 98.9 F (37.2 C)  (Oral)   Resp 18   SpO2 98%   Physical Exam Vitals and nursing note reviewed.  Constitutional:      General: He is not in acute distress.    Appearance: He is obese. He is not ill-appearing, toxic-appearing or diaphoretic.  HENT:     Head: Normocephalic and atraumatic.     Nose: Nose normal.  Eyes:     Extraocular Movements: Extraocular movements intact.     Conjunctiva/sclera: Conjunctivae normal.  Cardiovascular:     Rate and Rhythm: Normal rate and regular rhythm.     Heart sounds: Normal heart sounds. No murmur heard.   Pulmonary:     Effort: Pulmonary effort is normal. No respiratory distress.     Breath sounds: Normal breath sounds. No wheezing or rales.  Chest:     Chest wall: No tenderness.  Abdominal:     General: Abdomen is protuberant.     Palpations: Abdomen is soft.     Tenderness: There is no abdominal tenderness. There is no guarding or rebound.  Musculoskeletal:     Right lower leg: Edema present.     Left lower leg: Edema present.     Comments: L >R pitting edema, no overlying erythema   Skin:    General: Skin is warm.     Findings: No rash.  Neurological:     General: No focal deficit present.     Mental Status: He is alert and oriented to person, place, and time.  Psychiatric:        Mood and Affect: Mood normal.        Behavior: Behavior normal.     ED Results / Procedures / Treatments   Labs (all labs ordered are listed, but only abnormal results are displayed) Labs Reviewed  BASIC METABOLIC PANEL - Abnormal; Notable for the following components:      Result Value   Glucose, Bld 109 (*)    All other components within normal limits  TROPONIN I (HIGH SENSITIVITY) - Abnormal; Notable for the following components:   Troponin I (High Sensitivity) 437 (*)    All other components within normal limits  CBC  HEPARIN LEVEL (UNFRACTIONATED)  TROPONIN I (HIGH SENSITIVITY)  TROPONIN I (HIGH SENSITIVITY)    EKG EKG  Interpretation  Date/Time:  Thursday May 19 2020 21:41:00 EDT Ventricular Rate:  72 PR Interval:    QRS Duration: 106 QT Interval:  397 QTC Calculation: 435 R Axis:   49 Text Interpretation: Sinus rhythm Low voltage, precordial leads Borderline repolarization abnormality Confirmed by Kennis Carina [phone]) on 05/19/2020 9:42:57 PM   Radiology DG Chest 2 View  Result Date: 05/19/2020 CLINICAL DATA:  Chest pain EXAM: CHEST - 2 VIEW COMPARISON:  04/22/2012 FINDINGS: The heart size and mediastinal contours are within normal limits. Both lungs are clear. The visualized skeletal structures are unremarkable. IMPRESSION: No active cardiopulmonary disease. Electronically Signed   By: Jasmine Pang M.D.   On: 05/19/2020 17:18   CT ANGIO CHEST PE W OR WO CONTRAST  Result Date: 05/19/2020 CLINICAL DATA:  Chest pain, short of breath, recent prolonged travel,  hypertension EXAM: CT ANGIOGRAPHY CHEST WITH CONTRAST TECHNIQUE: Multidetector CT imaging of the chest was performed using the standard protocol during bolus administration of intravenous contrast. Multiplanar CT image reconstructions and MIPs were obtained to evaluate the vascular anatomy. CONTRAST:  OMNIPAQUE IOHEXOL 350 MG/ML SOLN COMPARISON:  05/19/2020 FINDINGS: Cardiovascular: The contrast bolus within the main pulmonary artery is adequate. However, evaluation of the segmental branches is limited due to mixing artifact, primarily within the left upper lobe, left lower lobe, and right lower lobe. I do not see any filling defects or pulmonary emboli within the opacified portions of the pulmonary artery. The heart is unremarkable without pericardial effusion. Mild coronary artery atherosclerosis. Mediastinum/Nodes: There is a 12 mm short axis right paratracheal lymph node, nonspecific. No other pathologic adenopathy. Thyroid, trachea, and esophagus are normal. Lungs/Pleura: No airspace disease, effusion, or pneumothorax. Central airways are patent.  Upper Abdomen: No acute abnormality. Musculoskeletal: No acute or destructive bony lesions. Reconstructed images demonstrate no additional findings. Review of the MIP images confirms the above findings. IMPRESSION: 1. Technically limited evaluation of the pulmonary vasculature due to contrast bolus and mixing artifact within the segmental branches of the left upper, left lower, and right lower lobes as above. Within the opacified portions of the vascular tree, there are no filling defects or pulmonary emboli. 2. Nonspecific 12 mm right paratracheal lymph node. 3. Otherwise no acute intrathoracic process. Electronically Signed   By: Sharlet Salina M.D.   On: 05/19/2020 23:22    Procedures Procedures (including critical care time)  Medications Ordered in ED Medications  sodium chloride flush (NS) 0.9 % injection 3 mL (3 mLs Intravenous Not Given 05/19/20 2144)  heparin bolus via infusion 4,000 Units (has no administration in time range)  heparin ADULT infusion 100 units/mL (25000 units/215mL sodium chloride 0.45%) (has no administration in time range)  aspirin EC tablet 325 mg (325 mg Oral Given 05/19/20 2330)  iohexol (OMNIPAQUE) 350 MG/ML injection 100 mL (100 mLs Intravenous Contrast Given 05/19/20 2300)    ED Course  I have reviewed the triage vital signs and the nursing notes.  Pertinent labs & imaging results that were available during my care of the patient were reviewed by me and considered in my medical decision making (see chart for details).  Clinical Course as of May 20 1  Thu May 19, 2020  2141 EKG 12-Lead [AP]    Clinical Course User Index [AP] Golden Pop, MD   MDM Rules/Calculators/A&P                          Pt is a 55 y.o. male with pertinent PMHX of Obesity, HTN who presents w/ chest pain. Pt describes sternal severe chest pain radiating to L shoulder. Accompanied by sensation of indigestion, SOB, myalgia. Denies changes in pain with changes in position, not pleuritic.  Pt recently took long car ride (11hr) 2 days prior. No cardiac hx. On presentation pt afebrile, HDS, NAD. On exam pt wo cardiac murmur. No tenderness to palpation chest wall. Lungs clear to ausculation. No abdominal pain. Pt with b/l pitting edema to LE L>R. No overlying erythema. EKG significant for low voltage with t wave inversion noted in II,III,aVF and V4-6. CXR wo significant cardiopulmonary abnormality. HEAR 4.   Initial labs completed in first look significant for troponin rise form 12 to 437. No significant abnormalities on BMP. No leukocytosis, no anemia. ASA 325 given in setting of likely NSTEMI. Due to patient reported  SOB, increased troponin and recent long car ride PE cannot be excluded and CTA PE completed that was not significant for PE. Other diagnoses were considered including: PNA however CXR neg, no leukocytosis, no cough, no fever. No overlying skin findings concerning for shingles. No pneumothorax on CXR. Presentation and work up consistent with NSTEMI. Following negative CTA PE, heparin bolus initiated.   Due to increased in troponin with negative PE study, will admit pt for further evaluation and management in setting of NSTEMI. Case discussed with Dr. Cherly Beacharlisle of cardiology.   The plan for this patient was discussed with Dr. Pilar PlateBero, who voiced agreement and who oversaw evaluation and treatment of this patient.   Final Clinical Impression(s) / ED Diagnoses Final diagnoses:  Chest pain, unspecified type  NSTEMI (non-ST elevated myocardial infarction) [facility](HCC)   ED Disposition    ED Disposition Condition Comment   Admit  The patient appears reasonably stabilized for admission considering the current resources, flow, and capabilities available in the ED at this time, and I doubt any other [facility] requiring further screening and/or treatment in the ED prior to admission is  present.       Rx / DC Orders ED Discharge Orders    None       Golden PopPapier, Rodgerick Gilliand, MD 05/20/20 0003     Sabas SousBero, Michael M, MD 05/20/20 1623

## 2020-05-19 NOTE — H&P (Addendum)
Cardiology Admission History and Physical:   Patient ID: Michael [name] MRN: [id number]; DOB: [birth year]   Admission date: 05/19/2020  Primary Care Provider: Ardith Dark, MD [facility] Cardiologist: No primary care provider on file.  [facility] Electrophysiologist:  None   Chief Complaint: chest pain  Patient Profile:   31M with HTN, obesity and OSA with no known CAD hx who presents with severe CP and found to have NSTEMI.   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  He started to burp some and waited 5 to 10 minutes however his pain did not subside.  Within 30 minutes he had 8/10 severity pain in the center of his chest with radiation to the left shoulder and jaw.  This lasted for 3 to 4 hours and would fluctuate with mild improvement over that time span.  He said that he felt like he had to put pressure back on his chest to relieve the pain by squeezing his chest with his arms.  He feels like he has a very high pain tolerance and this was not tolerable at all.  He is minimally active, overweight, significantly hypertensive on exam.  He does not have new diabetes and has no tobacco use with only infrequent alcohol use.  He just got back from vacation in [place] where he and his wife drove there and back over 10 hours each way.  Eventually his chest pain resolved in the emergency department without any nitroglycerin or other pain adjuncts.  He denies any dyspnea on exertion, shortness of breath, orthopnea, or unexpected weight gain.  Past Medical History:  Diagnosis Date  . Obesity hypoventilation syndrome (HCC)   . Sleep apnea   . Sleep apnea with use of continuous positive airway pressure (CPAP) 08/27/2013  . Viral meningitis    History reviewed. No pertinent surgical history.   Medications Prior to  Admission: Prior to Admission medications   Medication Sig Start Date End Date Taking? Authorizing Provider  folic acid (FOLVITE) 1 MG tablet Take 1 mg by mouth daily. 02/24/20   [provider]  hydrOXYzine (ATARAX/VISTARIL) 50 MG tablet TAKE 1/2-2 TABLETS BY MOUTH AT BEDTIME AS NEEDED (INSOMNIA). 03/24/20   Ardith Dark, MD  methotrexate [facility]) 2.5 MG tablet  08/31/19   [provider]  neomycin-polymyxin-hydrocortisone (CORTISPORIN) OTIC solution Apply 1-2 drops to left toe at bedtime for bacterial infection 04/06/20   Asencion Islam, DPM  sildenafil (REVATIO) 20 MG tablet TAKE 1-2 TABS BY MOUTH DAILY AS NEEDED. 05/02/20   Ardith Dark, MD  terbinafine (LAMISIL) 250 MG tablet Take 1 tablet (250 mg total) by mouth daily. 04/15/20   Asencion Islam, DPM    Allergies:   No Known Allergies  Social History:   Social History   Socioeconomic History  . Marital status: Divorced    Spouse name: Not on file  . Number of children: Not on file  . Years of education: 43  . Highest education level: Not on file  Occupational History    Employer: [employer]  Tobacco Use  . Smoking status: Never Smoker  . Smokeless tobacco: Never Used  Substance and Sexual Activity  . Alcohol use: Yes    Comment: rarely  . Drug use: No  . Sexual activity: Not on file  Other Topics Concern  . Not on  file  Social History Narrative   Patient is single and lives alone.   Patient is working full-time.   Patient has a college education.   Patient is right-handed.   Patient drinks 3-4 cups of coffee daily.   Social Determinants of Health   Financial Resource Strain:   . Difficulty of Paying Living Expenses:   Food Insecurity:   . Worried About Programme researcher, broadcasting/film/videounning Out of Food in the Last Year:   . Baristaan Out of Food in the Last Year:   Transportation Needs:   . Freight forwarderLack of Transportation (Medical):   Marland Kitchen. Lack of Transportation (Non-Medical):   Physical Activity:   . Days of Exercise per Week:   . Minutes  of Exercise per Session:   Stress:   . Feeling of Stress :   Social Connections:   . Frequency of Communication with Friends and Family:   . Frequency of Social Gatherings with Friends and Family:   . Attends Religious Services:   . Active Member of Clubs or Organizations:   . Attends BankerClub or Organization Meetings:   Marland Kitchen. Marital Status:   Intimate Partner Violence:   . Fear of Current or Ex-Partner:   . Emotionally Abused:   Marland Kitchen. Physically Abused:   . Sexually Abused:     Family History:   The patient's family history includes Alzheimer's disease in his mother.   Patient only knows his mothers family hx, no hx of SCD or premature CAD.   ROS:   Review of Systems: [y] = yes, [ ]  = no       General: Weight gain [ ] ; Weight loss [ ] ; Anorexia [ ] ; Fatigue [ ] ; Fever [ ] ; Chills [ ] ; Weakness [ ]     Cardiac: Chest pain/pressure [y]; Resting SOB [ ] ; Exertional SOB [ ] ; Orthopnea [ ] ; Pedal Edema [y]; Palpitations [ ] ; Syncope [ ] ; Presyncope [ ] ; Paroxysmal nocturnal dyspnea [ ]     Pulmonary: Cough [ ] ; Wheezing [ ] ; Hemoptysis [ ] ; Sputum [ ] ; Snoring [ ]     GI: Vomiting [ ] ; Dysphagia [ ] ; Melena [ ] ; Hematochezia [ ] ; Heartburn [ ] ; Abdominal pain [ ] ; Constipation [ ] ; Diarrhea [ ] ; BRBPR [ ]     GU: Hematuria [ ] ; Dysuria [ ] ; Nocturia [ ]   Vascular: Pain in legs with walking [ ] ; Pain in feet with lying flat [ ] ; Non-healing sores [ ] ; Stroke [ ] ; TIA [ ] ; Slurred speech [ ] ;    Neuro: Headaches [ ] ; Vertigo [ ] ; Seizures [ ] ; Paresthesias [ ] ;Blurred vision [ ] ; Diplopia [ ] ; Vision changes [ ]     Ortho/Skin: Arthritis [ ] ; Joint pain [ ] ; Muscle pain [ ] ; Joint swelling [ ] ; Back Pain [ ] ; Rash [ ]     Psych: Depression [ ] ; Anxiety [ ]     Heme: Bleeding problems [ ] ; Clotting disorders [ ] ; Anemia [ ]     Endocrine: Diabetes [ ] ; Thyroid dysfunction [ ]    Physical Exam/Data:   Vitals:   05/20/20 0300 05/20/20 0309 05/20/20 0315 05/20/20 0330  BP: (!) 149/98 (!) 150/99  (!) 151/98 (!) 150/96  Pulse: 66 69 66 61  Resp: 16 17 20 12   Temp:      TempSrc:      SpO2: 94% 97% 96% 95%   No intake or output data in the 24 hours ending 05/20/20 0428 Last 3 Weights 10/02/2018 08/28/2018 02/24/2014  Weight (lbs) 316 lb 3.2 oz 313  lb 9.6 oz 328 lb  Weight (kg) 143.427 kg 142.248 kg 148.78 kg     There is no height or weight on file to calculate BMI.  General:  Well nourished, well developed, overweight, in no acute distress  HEENT: normal Lymph: no adenopathy Neck: no JVD Endocrine:  No thryomegaly Vascular: No carotid bruits; FA pulses 2+ bilaterally without bruits  Cardiac:  normal S1, S2; RRR; no murmur  Lungs:  clear to auscultation bilaterally, no wheezing, rhonchi or rales  Abd: soft, nontender, no hepatomegaly  Ext: 2+ pitting edema b/l Musculoskeletal:  No deformities, BUE and BLE strength normal and equal Skin: warm and dry  Neuro:  CNs 2-12 intact, no focal abnormalities noted Psych:  Normal affect   EKG:  The ECG that was done was personally reviewed and demonstrates NSR (83) with lateral TWI but no ST changes   Relevant CV Studies:  CTPE Result date: 05/19/20 1. Technically limited evaluation of the pulmonary vasculature due to contrast bolus and mixing artifact within the segmental branches of the left upper, left lower, and right lower lobes as above. Within the opacified portions of the vascular tree, there are no filling defects or pulmonary emboli. 2. Nonspecific 12 mm right paratracheal lymph node. 3. Otherwise no acute intrathoracic process.  Laboratory Data:  High Sensitivity Troponin:   Recent Labs  Lab 05/19/20 1639 05/19/20 1952 05/19/20 2333 05/20/20 0123  TROPONINIHS 12 437* 1,801* 1,706*      Chemistry Recent Labs  Lab 05/19/20 1639  NA 140  K 3.8  CL 106  CO2 24  GLUCOSE 109*  BUN 14  CREATININE 0.93  CALCIUM 9.5  GFRNONAA >60  GFRAA >60  ANIONGAP 10    No results for input(s): PROT, ALBUMIN, AST,  ALT, ALKPHOS, BILITOT in the last 168 hours. Hematology Recent Labs  Lab 05/19/20 1639  WBC 8.1  RBC 5.52  HGB 15.6  HCT 47.5  MCV 86.1  MCH 28.3  MCHC 32.8  RDW 14.3  PLT 188   BNPNo results for input(s): BNP, PROBNP in the last 168 hours.  DDimer No results for input(s): DDIMER in the last 168 hours.  Radiology/Studies:  DG Chest 2 View  Result Date: 05/19/2020 CLINICAL DATA:  Chest pain EXAM: CHEST - 2 VIEW COMPARISON:  04/22/2012 FINDINGS: The heart size and mediastinal contours are within normal limits. Both lungs are clear. The visualized skeletal structures are unremarkable. IMPRESSION: No active cardiopulmonary disease. Electronically Signed   By: Jasmine Pang M.D.   On: 05/19/2020 17:18   CT ANGIO CHEST PE W OR WO CONTRAST  Result Date: 05/19/2020 CLINICAL DATA:  Chest pain, short of breath, recent prolonged travel, hypertension EXAM: CT ANGIOGRAPHY CHEST WITH CONTRAST TECHNIQUE: Multidetector CT imaging of the chest was performed using the standard protocol during bolus administration of intravenous contrast. Multiplanar CT image reconstructions and MIPs were obtained to evaluate the vascular anatomy. CONTRAST:  OMNIPAQUE IOHEXOL 350 MG/ML SOLN COMPARISON:  05/19/2020 FINDINGS: Cardiovascular: The contrast bolus within the main pulmonary artery is adequate. However, evaluation of the segmental branches is limited due to mixing artifact, primarily within the left upper lobe, left lower lobe, and right lower lobe. I do not see any filling defects or pulmonary emboli within the opacified portions of the pulmonary artery. The heart is unremarkable without pericardial effusion. Mild coronary artery atherosclerosis. Mediastinum/Nodes: There is a 12 mm short axis right paratracheal lymph node, nonspecific. No other pathologic adenopathy. Thyroid, trachea, and esophagus are normal. Lungs/Pleura: No  airspace disease, effusion, or pneumothorax. Central airways are patent. Upper Abdomen:  No acute abnormality. Musculoskeletal: No acute or destructive bony lesions. Reconstructed images demonstrate no additional findings. Review of the MIP images confirms the above findings. IMPRESSION: 1. Technically limited evaluation of the pulmonary vasculature due to contrast bolus and mixing artifact within the segmental branches of the left upper, left lower, and right lower lobes as above. Within the opacified portions of the vascular tree, there are no filling defects or pulmonary emboli. 2. Nonspecific 12 mm right paratracheal lymph node. 3. Otherwise no acute intrathoracic process. Electronically Signed   By: Sharlet Salina M.D.   On: 05/19/2020 23:22   DG Chest Port 1 View  Result Date: 05/20/2020 CLINICAL DATA:  Chest pain which began at 4 p.m. yesterday, since subsided EXAM: PORTABLE CHEST 1 VIEW COMPARISON:  CT 05/19/2020, radiograph 05/19/2020 FINDINGS: Mild streaky basilar atelectatic changes. Lungs are otherwise clear. Stable cardiomediastinal contours accounting for differences in technique. No pneumothorax or visible effusion. No acute osseous or soft tissue abnormality. Degenerative changes are present in the imaged spine and shoulders. Telemetry leads overlie the chest. IMPRESSION: Mild streaky basilar atelectatic changes. No other acute cardiopulmonary abnormality. Electronically Signed   By: Kreg Shropshire M.D.   On: 05/20/2020 01:36   HEART score (5)  Assessment and Plan:   1. NSTEMI  Mr. Crass has a very concerning story for ACS and troponin elevation which is consistent with his symptoms onset.  Risk factors include obesity and hypertension.  Plan for coronary evaluation and today.  CT PE was obtained by ED with concern of recent small car trip and chest pain with some associated shortness of breath along with asymmetric pedal edema, CT PE negative. - NPO for cors angio/possible PCI - continue to trend trops until peak - TTE pending  - start heparin gtt - start metoprol  tartrate 25 mg PO bid - start captorpil 6.25 mg PO q8h - start atorva 80 mg PO qhs - s/p ASA 324 mg PO load, start ASA 81 mg PO daily   2. HTN - continue uptitration of captopril and consolidation to lisinopril post cath  - will likely need additional agents given sBP 160-180  3. Obesity Discussed RFs for CAD and patient now very motivated to change life style   4. 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 Inpatient status is judged to be reasonable and necessary in order to provide the required intensity of service to ensure the patient's safety. The patient's presenting symptoms, physical exam findings, and initial radiographic and laboratory data in the context of their chronic comorbidities is felt to place them at high risk for further clinical deterioration. Furthermore, it is not anticipated that the patient will be medically stable for discharge from the hospital within 2 midnights of admission. The following factors support the patient status of inpatient.   " The patient's presenting symptoms include chest pain " The worrisome physical exam findings include elevated troponins " The initial radiographic and laboratory data are worrisome because of n/a  The chronic co-morbidities include: HTN, obesity   * I certify that at the point of admission it is my clinical judgment that the patient will require inpatient hospital care spanning beyond 2 midnights from the point of admission due to high intensity of service,  high risk for further deterioration and high frequency of surveillance required.*   For questions or updates, please contact [facility] Please consult [email or web address] for contact info under   Signed, Linton Rump, MD  05/20/2020 4:28 AM

## 2020-05-20 ENCOUNTER — Inpatient Hospital Stay (HOSPITAL_COMMUNITY): Payer: BC Managed Care – PPO

## 2020-05-20 ENCOUNTER — Encounter (HOSPITAL_COMMUNITY): Payer: Self-pay | Admitting: Student in an Organized Health Care Education/Training Program

## 2020-05-20 ENCOUNTER — Inpatient Hospital Stay (HOSPITAL_COMMUNITY)
Admission: EM | Disposition: A | Discharge status: Home / Self Care | Payer: Self-pay | Source: Home / Self Care | Attending: Cardiovascular Disease

## 2020-05-20 DIAGNOSIS — I10 Essential (primary) hypertension: Secondary | ICD-10-CM | POA: Diagnosis not present

## 2020-05-20 DIAGNOSIS — Z20822 Contact with and (suspected) exposure to covid-19: Secondary | ICD-10-CM | POA: Diagnosis present

## 2020-05-20 DIAGNOSIS — R0789 Other chest pain: Secondary | ICD-10-CM | POA: Diagnosis present

## 2020-05-20 DIAGNOSIS — Z6841 Body Mass Index (BMI) 40.0 and over, adult: Secondary | ICD-10-CM | POA: Diagnosis not present

## 2020-05-20 DIAGNOSIS — I214 Non-ST elevation (NSTEMI) myocardial infarction: Secondary | ICD-10-CM

## 2020-05-20 DIAGNOSIS — Z8661 Personal history of infections of the central nervous system: Secondary | ICD-10-CM | POA: Diagnosis not present

## 2020-05-20 DIAGNOSIS — E785 Hyperlipidemia, unspecified: Secondary | ICD-10-CM

## 2020-05-20 DIAGNOSIS — E662 Morbid (severe) obesity with alveolar hypoventilation: Secondary | ICD-10-CM | POA: Diagnosis present

## 2020-05-20 DIAGNOSIS — Z79899 Other long term (current) drug therapy: Secondary | ICD-10-CM | POA: Diagnosis not present

## 2020-05-20 DIAGNOSIS — I2510 Atherosclerotic heart disease of native coronary artery without angina pectoris: Secondary | ICD-10-CM

## 2020-05-20 DIAGNOSIS — M79605 Pain in left leg: Secondary | ICD-10-CM | POA: Diagnosis not present

## 2020-05-20 DIAGNOSIS — I5031 Acute diastolic (congestive) heart failure: Secondary | ICD-10-CM | POA: Diagnosis present

## 2020-05-20 DIAGNOSIS — I110 Hypertensive heart disease with heart failure: Secondary | ICD-10-CM | POA: Diagnosis present

## 2020-05-20 DIAGNOSIS — L301 Dyshidrosis [pompholyx]: Secondary | ICD-10-CM | POA: Diagnosis present

## 2020-05-20 HISTORY — PX: LEFT HEART CATH AND CORONARY ANGIOGRAPHY: CATH118249

## 2020-05-20 HISTORY — PX: CORONARY STENT INTERVENTION: CATH118234

## 2020-05-20 LAB — HEMOGLOBIN A1C
Hgb A1c MFr Bld: 5.3 % (ref 4.8–5.6)
Mean Plasma Glucose: 105.41 mg/dL

## 2020-05-20 LAB — TROPONIN I (HIGH SENSITIVITY)
Troponin I (High Sensitivity): 1706 ng/L (ref <18)
Troponin I (High Sensitivity): 1801 ng/L (ref <18)

## 2020-05-20 LAB — SARS CORONAVIRUS 2 BY RT PCR (HOSPITAL ORDER, PERFORMED IN ~~LOC~~ HOSPITAL LAB): SARS Coronavirus 2: NEGATIVE

## 2020-05-20 LAB — HEPATIC FUNCTION PANEL
ALT: 36 IU/L (ref 0–44)
AST: 28 IU/L (ref 0–40)
Albumin: 4.5 g/dL (ref 3.8–4.9)
Alkaline Phosphatase: 68 IU/L (ref 48–121)
Bilirubin Total: 0.5 mg/dL (ref 0.0–1.2)
Bilirubin, Direct: 0.12 mg/dL (ref 0.00–0.40)
Total Protein: 6.6 g/dL (ref 6.0–8.5)

## 2020-05-20 LAB — LIPID PANEL
Cholesterol: 229 mg/dL — ABNORMAL HIGH (ref 0–200)
HDL: 35 mg/dL — ABNORMAL LOW (ref >40)
LDL Cholesterol: 163 mg/dL — ABNORMAL HIGH (ref 0–99)
Total CHOL/HDL Ratio: 6.5 RATIO
Triglycerides: 155 mg/dL — ABNORMAL HIGH (ref <150)
VLDL: 31 mg/dL (ref 0–40)

## 2020-05-20 LAB — POCT ACTIVATED CLOTTING TIME
Activated Clotting Time: 268 seconds
Activated Clotting Time: 285 seconds

## 2020-05-20 LAB — HEPARIN LEVEL (UNFRACTIONATED): Heparin Unfractionated: 0.13 IU/mL — ABNORMAL LOW (ref 0.30–0.70)

## 2020-05-20 LAB — ECHOCARDIOGRAM COMPLETE
Height: 68 in
Weight: 4800 oz

## 2020-05-20 LAB — TSH: TSH: 4.002 u[IU]/mL (ref 0.350–4.500)

## 2020-05-20 LAB — HIV ANTIBODY (ROUTINE TESTING W REFLEX): HIV Screen 4th Generation wRfx: NONREACTIVE

## 2020-05-20 SURGERY — LEFT HEART CATH AND CORONARY ANGIOGRAPHY
Anesthesia: LOCAL

## 2020-05-20 MED ORDER — IOHEXOL 350 MG/ML SOLN
INTRAVENOUS | Status: DC | PRN
  Administered 2020-05-20: 125 mL

## 2020-05-20 MED ORDER — LOSARTAN POTASSIUM 50 MG PO TABS
50.0000 mg | ORAL_TABLET | Freq: Every day | ORAL | Status: DC
  Administered 2020-05-20: 50 mg via ORAL
  Filled 2020-05-20: qty 1

## 2020-05-20 MED ORDER — MIDAZOLAM HCL 2 MG/2ML IJ SOLN
INTRAMUSCULAR | Status: AC
  Filled 2020-05-20: qty 2

## 2020-05-20 MED ORDER — SODIUM CHLORIDE 0.9 % IV SOLN: 250.0000 mL | INTRAVENOUS | Status: DC | PRN

## 2020-05-20 MED ORDER — LIDOCAINE HCL (PF) 1 % IJ SOLN
INTRAMUSCULAR | Status: AC
  Filled 2020-05-20: qty 30

## 2020-05-20 MED ORDER — THE SENSUOUS HEART BOOK
Freq: Once | Status: AC
  Filled 2020-05-20: qty 1

## 2020-05-20 MED ORDER — VERAPAMIL HCL 2.5 MG/ML IV SOLN
INTRAVENOUS | Status: AC
  Filled 2020-05-20: qty 2

## 2020-05-20 MED ORDER — METOPROLOL TARTRATE 25 MG PO TABS: 25.0000 mg | ORAL_TABLET | Freq: Two times a day (BID) | ORAL | Status: DC

## 2020-05-20 MED ORDER — HEPARIN BOLUS VIA INFUSION
2000.0000 [IU] | Freq: Once | INTRAVENOUS | Status: AC
  Administered 2020-05-20: 2000 [IU] via INTRAVENOUS
  Filled 2020-05-20: qty 2000

## 2020-05-20 MED ORDER — ASPIRIN 81 MG PO CHEW
81.0000 mg | CHEWABLE_TABLET | ORAL | Status: AC
  Administered 2020-05-20: 81 mg via ORAL
  Filled 2020-05-20: qty 1

## 2020-05-20 MED ORDER — ANGIOPLASTY BOOK
Freq: Once | Status: AC
  Filled 2020-05-20: qty 1

## 2020-05-20 MED ORDER — METOPROLOL TARTRATE 25 MG PO TABS
25.0000 mg | ORAL_TABLET | Freq: Two times a day (BID) | ORAL | Status: DC
  Administered 2020-05-20: 25 mg via ORAL
  Filled 2020-05-20: qty 1

## 2020-05-20 MED ORDER — VERAPAMIL HCL 2.5 MG/ML IV SOLN
INTRAVENOUS | Status: DC | PRN
  Administered 2020-05-20: 10 mL via INTRA_ARTERIAL

## 2020-05-20 MED ORDER — ATORVASTATIN CALCIUM 80 MG PO TABS
80.0000 mg | ORAL_TABLET | Freq: Every day | ORAL | Status: DC
  Administered 2020-05-20 (×2): 80 mg via ORAL
  Filled 2020-05-20: qty 1

## 2020-05-20 MED ORDER — HEPARIN SODIUM (PORCINE) 1000 UNIT/ML IJ SOLN
INTRAMUSCULAR | Status: DC | PRN
  Administered 2020-05-20: 2000 [IU] via INTRAVENOUS
  Administered 2020-05-20: 4000 [IU] via INTRAVENOUS
  Administered 2020-05-20: 5000 [IU] via INTRAVENOUS
  Administered 2020-05-20: 8000 [IU] via INTRAVENOUS

## 2020-05-20 MED ORDER — ATORVASTATIN CALCIUM 80 MG PO TABS
80.0000 mg | ORAL_TABLET | Freq: Every day | ORAL | Status: DC
  Filled 2020-05-20: qty 1

## 2020-05-20 MED ORDER — HEART ATTACK BOUNCING BOOK
Freq: Once | Status: AC
  Filled 2020-05-20: qty 1

## 2020-05-20 MED ORDER — NITROGLYCERIN 1 MG/10 ML FOR IR/CATH LAB
INTRA_ARTERIAL | Status: AC
  Filled 2020-05-20: qty 10

## 2020-05-20 MED ORDER — SODIUM CHLORIDE 0.9% FLUSH: 3.0000 mL | INTRAVENOUS | Status: DC | PRN

## 2020-05-20 MED ORDER — ACETAMINOPHEN 325 MG PO TABS: 650.0000 mg | ORAL_TABLET | ORAL | Status: DC | PRN

## 2020-05-20 MED ORDER — HYDRALAZINE HCL 20 MG/ML IJ SOLN: 10.0000 mg | INTRAMUSCULAR | Status: AC | PRN

## 2020-05-20 MED ORDER — SODIUM CHLORIDE 0.9 % WEIGHT BASED INFUSION
3.0000 mL/kg/h | INTRAVENOUS | Status: DC
  Administered 2020-05-20: 3 mL/kg/h via INTRAVENOUS

## 2020-05-20 MED ORDER — SODIUM CHLORIDE 0.9 % IV SOLN: INTRAVENOUS | Status: DC

## 2020-05-20 MED ORDER — TERBINAFINE HCL 250 MG PO TABS
250.0000 mg | ORAL_TABLET | Freq: Every day | ORAL | Status: DC
  Administered 2020-05-21: 250 mg via ORAL
  Filled 2020-05-20 (×2): qty 1

## 2020-05-20 MED ORDER — CAPTOPRIL 6.25 MG HALF TABLET
6.2500 mg | ORAL_TABLET | Freq: Three times a day (TID) | ORAL | Status: DC
  Filled 2020-05-20 (×2): qty 1

## 2020-05-20 MED ORDER — SODIUM CHLORIDE 0.9% FLUSH
3.0000 mL | Freq: Two times a day (BID) | INTRAVENOUS | Status: DC
  Administered 2020-05-20 – 2020-05-21 (×2): 3 mL via INTRAVENOUS

## 2020-05-20 MED ORDER — PRASUGREL HCL 10 MG PO TABS
ORAL_TABLET | ORAL | Status: AC
  Filled 2020-05-20: qty 1

## 2020-05-20 MED ORDER — FENTANYL CITRATE (PF) 100 MCG/2ML IJ SOLN
INTRAMUSCULAR | Status: DC | PRN
  Administered 2020-05-20: 50 ug via INTRAVENOUS

## 2020-05-20 MED ORDER — HEPARIN (PORCINE) IN NACL 1000-0.9 UT/500ML-% IV SOLN
INTRAVENOUS | Status: AC
  Filled 2020-05-20: qty 1000

## 2020-05-20 MED ORDER — PRASUGREL HCL 10 MG PO TABS
10.0000 mg | ORAL_TABLET | Freq: Every day | ORAL | Status: DC
  Administered 2020-05-21: 10 mg via ORAL
  Filled 2020-05-20: qty 1

## 2020-05-20 MED ORDER — HEPARIN SODIUM (PORCINE) 1000 UNIT/ML IJ SOLN
INTRAMUSCULAR | Status: AC
  Filled 2020-05-20: qty 1

## 2020-05-20 MED ORDER — HEPARIN BOLUS VIA INFUSION
4000.0000 [IU] | Freq: Once | INTRAVENOUS | Status: AC
  Administered 2020-05-20: 4000 [IU] via INTRAVENOUS
  Filled 2020-05-20: qty 4000

## 2020-05-20 MED ORDER — METOPROLOL TARTRATE 25 MG PO TABS
25.0000 mg | ORAL_TABLET | Freq: Two times a day (BID) | ORAL | Status: DC
  Administered 2020-05-20 – 2020-05-21 (×2): 25 mg via ORAL
  Filled 2020-05-20 (×2): qty 1

## 2020-05-20 MED ORDER — HEPARIN (PORCINE) IN NACL 1000-0.9 UT/500ML-% IV SOLN
INTRAVENOUS | Status: DC | PRN
  Administered 2020-05-20 (×2): 500 mL

## 2020-05-20 MED ORDER — LABETALOL HCL 5 MG/ML IV SOLN: 10.0000 mg | INTRAVENOUS | Status: AC | PRN

## 2020-05-20 MED ORDER — ASPIRIN EC 81 MG PO TBEC
81.0000 mg | DELAYED_RELEASE_TABLET | Freq: Every day | ORAL | Status: DC
  Administered 2020-05-21: 81 mg via ORAL
  Filled 2020-05-20: qty 1

## 2020-05-20 MED ORDER — FENTANYL CITRATE (PF) 100 MCG/2ML IJ SOLN
INTRAMUSCULAR | Status: AC
  Filled 2020-05-20: qty 2

## 2020-05-20 MED ORDER — ENOXAPARIN SODIUM 40 MG/0.4ML ~~LOC~~ SOLN
40.0000 mg | SUBCUTANEOUS | Status: DC
  Administered 2020-05-21: 40 mg via SUBCUTANEOUS
  Filled 2020-05-20: qty 0.4

## 2020-05-20 MED ORDER — LABETALOL HCL 5 MG/ML IV SOLN
INTRAVENOUS | Status: AC
  Filled 2020-05-20: qty 4

## 2020-05-20 MED ORDER — PERFLUTREN LIPID MICROSPHERE
1.0000 mL | INTRAVENOUS | Status: AC | PRN
  Administered 2020-05-20: 4 mL via INTRAVENOUS
  Filled 2020-05-20: qty 10

## 2020-05-20 MED ORDER — NITROGLYCERIN 1 MG/10 ML FOR IR/CATH LAB
INTRA_ARTERIAL | Status: DC | PRN
  Administered 2020-05-20: 200 ug via INTRACORONARY

## 2020-05-20 MED ORDER — FOLIC ACID 1 MG PO TABS
1.0000 mg | ORAL_TABLET | Freq: Every day | ORAL | Status: DC
  Administered 2020-05-20 – 2020-05-21 (×2): 1 mg via ORAL
  Filled 2020-05-20 (×2): qty 1

## 2020-05-20 MED ORDER — SODIUM CHLORIDE 0.9 % IV SOLN: Freq: Once | INTRAVENOUS | Status: AC

## 2020-05-20 MED ORDER — NITROGLYCERIN 0.4 MG SL SUBL: 0.4000 mg | SUBLINGUAL_TABLET | SUBLINGUAL | Status: DC | PRN

## 2020-05-20 MED ORDER — FUROSEMIDE 10 MG/ML IJ SOLN
20.0000 mg | Freq: Once | INTRAMUSCULAR | Status: AC
  Administered 2020-05-20: 20 mg via INTRAVENOUS
  Filled 2020-05-20: qty 2

## 2020-05-20 MED ORDER — LIDOCAINE HCL (PF) 1 % IJ SOLN
INTRAMUSCULAR | Status: DC | PRN
  Administered 2020-05-20: 2 mL

## 2020-05-20 MED ORDER — ONDANSETRON HCL 4 MG/2ML IJ SOLN: 4.0000 mg | Freq: Four times a day (QID) | INTRAMUSCULAR | Status: DC | PRN

## 2020-05-20 MED ORDER — SODIUM CHLORIDE 0.9 % WEIGHT BASED INFUSION: 1.0000 mL/kg/h | INTRAVENOUS | Status: DC

## 2020-05-20 MED ORDER — ALUM & MAG HYDROXIDE-SIMETH 200-200-20 MG/5ML PO SUSP
30.0000 mL | ORAL | Status: DC | PRN
  Administered 2020-05-20: 30 mL via ORAL
  Filled 2020-05-20: qty 30

## 2020-05-20 MED ORDER — MIDAZOLAM HCL 2 MG/2ML IJ SOLN
INTRAMUSCULAR | Status: DC | PRN
  Administered 2020-05-20: 1 mg via INTRAVENOUS

## 2020-05-20 MED ORDER — HEPARIN (PORCINE) 25000 UT/250ML-% IV SOLN
2000.0000 [IU]/h | INTRAVENOUS | Status: DC
  Administered 2020-05-20: 1700 [IU]/h via INTRAVENOUS
  Filled 2020-05-20 (×2): qty 250

## 2020-05-20 MED ORDER — PRASUGREL HCL 10 MG PO TABS
ORAL_TABLET | ORAL | Status: DC | PRN
  Administered 2020-05-20: 60 mg via ORAL

## 2020-05-20 SURGICAL SUPPLY — 17 items
BALLN SAPPHIRE 2.5X12 (BALLOONS) ×2
BALLN SAPPHIRE ~~LOC~~ 4.0X12 (BALLOONS) ×1 IMPLANT
BALLOON SAPPHIRE 2.5X12 (BALLOONS) IMPLANT
CATH 5FR JL3.5 JR4 ANG PIG MP (CATHETERS) ×1 IMPLANT
CATH LAUNCHER 6FR AL.75 (CATHETERS) ×1 IMPLANT
DEVICE RAD COMP TR BAND LRG (VASCULAR PRODUCTS) ×1 IMPLANT
GLIDESHEATH SLEND SS 6F .021 (SHEATH) ×1 IMPLANT
GUIDEWIRE INQWIRE 1.5J.035X260 (WIRE) IMPLANT
INQWIRE 1.5J .035X260CM (WIRE) ×2
KIT ENCORE 26 ADVANTAGE (KITS) ×1 IMPLANT
KIT HEART LEFT (KITS) ×2 IMPLANT
PACK CARDIAC CATHETERIZATION (CUSTOM PROCEDURE TRAY) ×2 IMPLANT
STENT RESOLUTE ONYX 3.5X26 (Permanent Stent) ×1 IMPLANT
SYR MEDRAD MARK 7 150ML (SYRINGE) ×2 IMPLANT
TRANSDUCER W/STOPCOCK (MISCELLANEOUS) ×2 IMPLANT
TUBING CIL FLEX 10 FLL-RA (TUBING) ×2 IMPLANT
WIRE RUNTHROUGH .014X180CM (WIRE) ×1 IMPLANT

## 2020-05-20 NOTE — Progress Notes (Signed)
ANTICOAGULATION CONSULT NOTE  Pharmacy Consult for Heparin Indication: chest pain/ACS  No Known Allergies  Patient Measurements: Height: 5\' 8"  (172.7 cm) Weight: 136.1 kg (300 lb) IBW/kg (Calculated) : 68.4 Heparin Dosing Weight: 100 kg  Vital Signs: BP: 152/104 (07/02 0700) Pulse Rate: 70 (07/02 0700)  Labs: Recent Labs    05/19/20 1639 05/19/20 1639 05/19/20 1952 05/19/20 2333 05/20/20 0123 05/20/20 0829  HGB 15.6  --   --   --   --   --   HCT 47.5  --   --   --   --   --   PLT 188  --   --   --   --   --   HEPARINUNFRC  --   --   --   --   --  0.13*  CREATININE 0.93  --   --   --   --   --   TROPONINIHS 12   < > 437* 1,801* 1,706*  --    < > = values in this interval not displayed.    Estimated Creatinine Clearance: 124.1 mL/min (by C-G formula based on SCr of 0.93 mg/dL).   Medical History: Past Medical History:  Diagnosis Date  . Hyperlipidemia LDL goal <70 08/28/2018  . Obesity hypoventilation syndrome (HCC)   . Sleep apnea   . Sleep apnea with use of continuous positive airway pressure (CPAP) 08/27/2013  . Viral meningitis     Medications:  No current facility-administered medications on file prior to encounter.   Current Outpatient Medications on File Prior to Encounter  Medication Sig Dispense Refill  . cephALEXin (KEFLEX) 500 MG capsule Take 500 mg by mouth 3 (three) times daily.    . folic acid (FOLVITE) 1 MG tablet Take 1 mg by mouth daily.    . hydrOXYzine (ATARAX/VISTARIL) 50 MG tablet TAKE 1/2-2 TABLETS BY MOUTH AT BEDTIME AS NEEDED (INSOMNIA). (Patient taking differently: Take 25-50 mg by mouth at bedtime as needed for itching (insomnia). ) 180 tablet 1  . methotrexate (RHEUMATREX) 2.5 MG tablet Take 20 mg by mouth once a week. Tuesday    . Multiple Vitamin (MULTIVITAMIN) capsule Take 1 capsule by mouth daily.    . sildenafil (REVATIO) 20 MG tablet TAKE 1-2 TABS BY MOUTH DAILY AS NEEDED. (Patient taking differently: Take 20-40 mg by mouth daily  as needed (ED). ) 90 tablet 1  . terbinafine (LAMISIL) 250 MG tablet Take 1 tablet (250 mg total) by mouth daily. 90 tablet 0  . neomycin-polymyxin-hydrocortisone (CORTISPORIN) OTIC solution Apply 1-2 drops to left toe at bedtime for bacterial infection (Patient not taking: Reported on 05/20/2020) 10 mL 2     Assessment: 54 y.o. male presenting with CP. Pharmacy consulted to dose heparin for NSTEMI. Patient is not on anticoagulation PTA. Initial heparin level low at 0.13. CBC wnl. No bleeding or issues with infusion per discussion with RN. Patient scheduled for cath this morning.  Goal of Therapy:  Heparin level 0.3-0.7 units/ml Monitor platelets by anticoagulation protocol: Yes   Plan:  Heparin 2000 unit bolus x 1 Increase heparin to 2000 units/hr Check 6hr heparin level Monitor daily CBC, s/sx bleeding Cath 7/2   9/2, PharmD, BCPS Please check AMION for all [facility] contact numbers Clinical Pharmacist 05/20/2020 9:29 AM

## 2020-05-20 NOTE — Progress Notes (Signed)
Nutrition Education Note  RD consulted for nutrition education regarding a Heart Healthy diet.   Lipid Panel     Component Value Date/Time   CHOL 229 (H) 05/20/2020 0150   TRIG 155 (H) 05/20/2020 0150   HDL 35 (L) 05/20/2020 0150   CHOLHDL 6.5 05/20/2020 0150   VLDL 31 05/20/2020 0150   LDLCALC 163 (H) 05/20/2020 0150    RD provided "Heart Healthy Nutrition Therapy" handout from the Academy of [employer]. Reviewed patient's dietary recall. Provided examples on ways to decrease sodium and fat intake in diet. Discouraged intake of processed foods and use of salt shaker. Encouraged fresh fruits and vegetables as well as whole grain sources of carbohydrates to maximize fiber intake. Teach back method used.  Pt endorses eating two meals daily that consist of L- fast food meal with meat and grain D- meat, vegetable, grain. Has attempted to lose weight in the past with 10 day broth detox and arbonne diet (no gluten, dairy, soy, alcohol, or artifical sweetners). Discussed importance of eating three balance consistent meals daily. Discussed importance of decreased salt intake and which foods are considered high sodium.   Expect fair compliance.  Labs and medications reviewed. No further nutrition interventions warranted at this time. RD contact information provided. If additional nutrition issues arise, please re-consult RD.  Vanessa Kick RD, LDN Clinical Nutrition Pager listed in AMION

## 2020-05-20 NOTE — ED Notes (Signed)
Admitting at bedside 

## 2020-05-20 NOTE — Progress Notes (Addendum)
Progress Note  Patient Name: Michael [name] Date of Encounter: 05/20/2020  Primary Cardiologist:  Olga Millers, MD  Subjective   No CP overnight, ok for cath  Inpatient Medications    Scheduled Meds: . [START ON 05/21/2020] aspirin EC  81 mg Oral Daily  . atorvastatin  80 mg Oral QHS  . captopril  6.25 mg Oral Q8H  . folic acid  1 mg Oral Daily  . metoprolol tartrate  25 mg Oral BID  . terbinafine  250 mg Oral Daily   Continuous Infusions: . sodium chloride Stopped (05/20/20 0101)  . sodium chloride    . heparin 1,700 Units/hr (05/20/20 0053)   PRN Meds: acetaminophen, nitroGLYCERIN, ondansetron (ZOFRAN) IV   Vital Signs    Vitals:   05/20/20 0500 05/20/20 0600 05/20/20 0700 05/20/20 0805  BP: (!) 162/109 (!) 161/103 (!) 152/104   Pulse: 69 68 70   Resp: (!) 27 14 17    Temp:      TempSrc:      SpO2: 95% 91% 94%   Weight:    136.1 kg  Height:    5\' 8"  (1.727 m)   No intake or output data in the 24 hours ending 05/20/20 0826 Filed Weights   05/20/20 0805  Weight: 136.1 kg   Last Weight  Most recent update: 05/20/2020  8:05 AM   Weight  136.1 kg (300 lb)           Weight change:    Telemetry    SR - Personally Reviewed  ECG    SR, HR 71, lateral T wave changes were seen 07/01, inferior T wave changes are new - Personally Reviewed  Physical Exam   General: Well developed, well nourished, male appearing in no acute distress. Head: Normocephalic, atraumatic.  Neck: Supple without bruits, JVD not seen elevated. Lungs:  Resp regular and unlabored, CTA. Heart: RRR, S1, S2, no S3, S4, or murmur; no rub. Abdomen: Soft, non-tender, non-distended with normoactive bowel sounds. No hepatomegaly. No rebound/guarding. No obvious abdominal masses. Extremities: No clubbing, cyanosis, no edema. Distal pedal pulses are 2+ bilaterally. Neuro: Alert and oriented X 3. Moves all extremities spontaneously. Psych: Normal affect.  Labs    Hematology Recent Labs    Lab 05/19/20 1639  WBC 8.1  RBC 5.52  HGB 15.6  HCT 47.5  MCV 86.1  MCH 28.3  MCHC 32.8  RDW 14.3  PLT 188    Chemistry Recent Labs  Lab 05/19/20 1013 05/19/20 1639  NA  --  140  K  --  3.8  CL  --  106  CO2  --  24  GLUCOSE  --  109*  BUN  --  14  CREATININE  --  0.93  CALCIUM  --  9.5  PROT 6.6  --   ALBUMIN 4.5  --   AST 28  --   ALT 36  --   ALKPHOS 68  --   BILITOT 0.5  --   GFRNONAA  --  >60  GFRAA  --  >60  ANIONGAP  --  10     High Sensitivity Troponin:   Recent Labs  Lab 05/19/20 1639 05/19/20 1952 05/19/20 2333 05/20/20 0123  TROPONINIHS 12 437* 1,801* 1,706*      BNPNo results for input(s): BNP, PROBNP in the last 168 hours.   DDimer No results for input(s): DDIMER in the last 168 hours.   Lab Results  Component Value Date   TSH 4.002 05/20/2020  Lab Results  Component Value Date   HGBA1C 5.3 05/19/2020   Lab Results  Component Value Date   CHOL 229 (H) 05/20/2020   HDL 35 (L) 05/20/2020   LDLCALC 163 (H) 05/20/2020   TRIG 155 (H) 05/20/2020   CHOLHDL 6.5 05/20/2020    Radiology    DG Chest 2 View  Result Date: 05/19/2020 CLINICAL DATA:  Chest pain EXAM: CHEST - 2 VIEW COMPARISON:  04/22/2012 FINDINGS: The heart size and mediastinal contours are within normal limits. Both lungs are clear. The visualized skeletal structures are unremarkable. IMPRESSION: No active cardiopulmonary disease. Electronically Signed   By: Jasmine Pang M.D.   On: 05/19/2020 17:18   CT ANGIO CHEST PE W OR WO CONTRAST  Result Date: 05/19/2020 CLINICAL DATA:  Chest pain, short of breath, recent prolonged travel, hypertension EXAM: CT ANGIOGRAPHY CHEST WITH CONTRAST TECHNIQUE: Multidetector CT imaging of the chest was performed using the standard protocol during bolus administration of intravenous contrast. Multiplanar CT image reconstructions and MIPs were obtained to evaluate the vascular anatomy. CONTRAST:  OMNIPAQUE IOHEXOL 350 MG/ML SOLN  COMPARISON:  05/19/2020 FINDINGS: Cardiovascular: The contrast bolus within the main pulmonary artery is adequate. However, evaluation of the segmental branches is limited due to mixing artifact, primarily within the left upper lobe, left lower lobe, and right lower lobe. I do not see any filling defects or pulmonary emboli within the opacified portions of the pulmonary artery. The heart is unremarkable without pericardial effusion. Mild coronary artery atherosclerosis. Mediastinum/Nodes: There is a 12 mm short axis right paratracheal lymph node, nonspecific. No other pathologic adenopathy. Thyroid, trachea, and esophagus are normal. Lungs/Pleura: No airspace disease, effusion, or pneumothorax. Central airways are patent. Upper Abdomen: No acute abnormality. Musculoskeletal: No acute or destructive bony lesions. Reconstructed images demonstrate no additional findings. Review of the MIP images confirms the above findings. IMPRESSION: 1. Technically limited evaluation of the pulmonary vasculature due to contrast bolus and mixing artifact within the segmental branches of the left upper, left lower, and right lower lobes as above. Within the opacified portions of the vascular tree, there are no filling defects or pulmonary emboli. 2. Nonspecific 12 mm right paratracheal lymph node. 3. Otherwise no acute intrathoracic process. Electronically Signed   By: Sharlet Salina M.D.   On: 05/19/2020 23:22   DG Chest Port 1 View  Result Date: 05/20/2020 CLINICAL DATA:  Chest pain which began at 4 p.m. yesterday, since subsided EXAM: PORTABLE CHEST 1 VIEW COMPARISON:  CT 05/19/2020, radiograph 05/19/2020 FINDINGS: Mild streaky basilar atelectatic changes. Lungs are otherwise clear. Stable cardiomediastinal contours accounting for differences in technique. No pneumothorax or visible effusion. No acute osseous or soft tissue abnormality. Degenerative changes are present in the imaged spine and shoulders. Telemetry leads overlie  the chest. IMPRESSION: Mild streaky basilar atelectatic changes. No other acute cardiopulmonary abnormality. Electronically Signed   By: Kreg Shropshire M.D.   On: 05/20/2020 01:36   Cardiac Studies   ECHO:  Ordered  CATH: Ordered  Patient Profile     54 y.o. male w/ hx HTN, HLD, morbid obesity, OSA, was admitted 07/01 w/ NSTEMI.   Assessment & Plan    1. NSTEMI - currently pain-free - on ASA, high-dose statin, BB, ACE - cath this am - Cardiac catheterization was discussed with the patient fully. The patient understands that risks include but are not limited to stroke (1 in 1000), death (1 in 1000), kidney failure [usually temporary] (1 in 500), bleeding (1 in 200), allergic reaction [  possibly serious] (1 in 200).  The patient understands and is willing to proceed.    2. HTN - was not on rx pta - now on Lopressor 25 mg bid, captopril 6.25 mg q 8 h - got doses overnight, give rx now since BP elevated  3. HLD - Lipitor 80 mg qd is new - LFTs are ok  Principal Problem:   NSTEMI (non-ST elevated myocardial infarction) (HCC) Active Problems:   OSA on CPAP   Obesity, morbid (HCC)   Hyperlipidemia LDL goal <70   Hypertension  Signed, Theodore Demark , PA-C 8:26 AM 05/20/2020 Pager: [phone] As above, patient seen and examined.  Patient admitted with non-ST elevation myocardial infarction.  He is presently pain-free.  Plan to continue aspirin, heparin, metoprolol and statin.  Proceed with cardiac catheterization today.  The risks and benefits including myocardial infarction, CVA and death discussed and he agrees to proceed.  His blood pressure is elevated.  Continue metoprolol.  Discontinue captopril and add losartan 50 mg daily.  Increase medications as needed.  His LDL was also elevated.  Continue Lipitor.  Check lipids and liver in 12 weeks. Olga Millers, MD

## 2020-05-20 NOTE — ED Notes (Signed)
Pt transferred to hospital bed for comfort.

## 2020-05-20 NOTE — Progress Notes (Signed)
[facility] Post-PCI Note  Subjective: No CP or shortness of breath.  No pain at right radial cath site.  Still some pain in left posterior calf with associated swelling.  Pt concerned about possibility of DVT, given recent prolonged car ride.  Objective: Temp:  [97.8 F (36.6 C)-98.9 F (37.2 C)] 97.8 F (36.6 C) (07/02 1436) Pulse Rate:  [0-85] 68 (07/02 1457) Resp:  [0-27] 20 (07/02 1436) BP: (131-191)/(86-147) 166/106 (07/02 1457) SpO2:  [0 %-100 %] 97 % (07/02 1457) Weight:  [136.1 kg] 136.1 kg (07/02 0805) Ext: TR band in place on right wrist.  No hematoma.  1+ bilateral calf edema.  Mild tenderness in left posterior calf; no erythema or palpable chord.  Assessment/plan: 54 y/o man admitted with NSTEMI s/p PCI to distal RCA without recurrent angina.  Continue medical therapy, including diuresis for moderately elevated LVEDP consistent with diastolic heart failure.  Echo currently being performed.  I suspect LE edema is mostly like due to HF, though given recent extended car ride and pain/tenderness in the left calf, we will obtain LLE venous Duplex (of note CTA chest was negative for PE yesterday).  If DVT were to be found, I would favor transitioning to NOAC and clopidogrel prior to discharge.  Yvonne Kendall, MD [facility]

## 2020-05-20 NOTE — Interval H&P Note (Signed)
History and Physical Interval Note:  05/20/2020 10:17 AM  Michael [name]  has presented today for surgery, with the diagnosis of NSTEMI.  The various methods of treatment have been discussed with the patient and family. After consideration of risks, benefits and other options for treatment, the patient has consented to  Procedure(s): LEFT HEART CATH AND CORONARY ANGIOGRAPHY (N/A) as a surgical intervention.  The patient's history has been reviewed, patient examined, no change in status, stable for surgery.  I have reviewed the patient's chart and labs.  Questions were answered to the patient's satisfaction.    Cath Lab Visit (complete for each Cath Lab visit)  Clinical Evaluation Leading to the Procedure:   ACS: Yes.    Non-ACS:  N/A  Michael [name]

## 2020-05-20 NOTE — ED Notes (Signed)
Pt ambulatory to bathroom

## 2020-05-20 NOTE — Progress Notes (Addendum)
  Echocardiogram 2D Echocardiogram has been performed with Definity.  Gerda Diss 05/20/2020, 4:19 PM

## 2020-05-20 NOTE — H&P (View-Only) (Signed)
 Progress Note  Patient Name: Michael [name] Date of Encounter: 05/20/2020  Primary Cardiologist:  Sylvan Lahm, MD  Subjective   No CP overnight, ok for cath  Inpatient Medications    Scheduled Meds: . [START ON 05/21/2020] aspirin EC  81 mg Oral Daily  . atorvastatin  80 mg Oral QHS  . captopril  6.25 mg Oral Q8H  . folic acid  1 mg Oral Daily  . metoprolol tartrate  25 mg Oral BID  . terbinafine  250 mg Oral Daily   Continuous Infusions: . sodium chloride Stopped (05/20/20 0101)  . sodium chloride    . heparin 1,700 Units/hr (05/20/20 0053)   PRN Meds: acetaminophen, nitroGLYCERIN, ondansetron (ZOFRAN) IV   Vital Signs    Vitals:   05/20/20 0500 05/20/20 0600 05/20/20 0700 05/20/20 0805  BP: (!) 162/109 (!) 161/103 (!) 152/104   Pulse: 69 68 70   Resp: (!) 27 14 17   Temp:      TempSrc:      SpO2: 95% 91% 94%   Weight:    136.1 kg  Height:    5' 8" (1.727 m)   No intake or output data in the 24 hours ending 05/20/20 0826 Filed Weights   05/20/20 0805  Weight: 136.1 kg   Last Weight  Most recent update: 05/20/2020  8:05 AM   Weight  136.1 kg (300 lb)           Weight change:    Telemetry    SR - Personally Reviewed  ECG    SR, HR 71, lateral T wave changes were seen 07/01, inferior T wave changes are new - Personally Reviewed  Physical Exam   General: Well developed, well nourished, male appearing in no acute distress. Head: Normocephalic, atraumatic.  Neck: Supple without bruits, JVD not seen elevated. Lungs:  Resp regular and unlabored, CTA. Heart: RRR, S1, S2, no S3, S4, or murmur; no rub. Abdomen: Soft, non-tender, non-distended with normoactive bowel sounds. No hepatomegaly. No rebound/guarding. No obvious abdominal masses. Extremities: No clubbing, cyanosis, no edema. Distal pedal pulses are 2+ bilaterally. Neuro: Alert and oriented X 3. Moves all extremities spontaneously. Psych: Normal affect.  Labs    Hematology Recent Labs    Lab 05/19/20 1639  WBC 8.1  RBC 5.52  HGB 15.6  HCT 47.5  MCV 86.1  MCH 28.3  MCHC 32.8  RDW 14.3  PLT 188    Chemistry Recent Labs  Lab 05/19/20 1013 05/19/20 1639  NA  --  140  K  --  3.8  CL  --  106  CO2  --  24  GLUCOSE  --  109*  BUN  --  14  CREATININE  --  0.93  CALCIUM  --  9.5  PROT 6.6  --   ALBUMIN 4.5  --   AST 28  --   ALT 36  --   ALKPHOS 68  --   BILITOT 0.5  --   GFRNONAA  --  >60  GFRAA  --  >60  ANIONGAP  --  10     High Sensitivity Troponin:   Recent Labs  Lab 05/19/20 1639 05/19/20 1952 05/19/20 2333 05/20/20 0123  TROPONINIHS 12 437* 1,801* 1,706*      BNPNo results for input(s): BNP, PROBNP in the last 168 hours.   DDimer No results for input(s): DDIMER in the last 168 hours.   Lab Results  Component Value Date   TSH 4.002 05/20/2020     Lab Results  Component Value Date   HGBA1C 5.3 05/19/2020   Lab Results  Component Value Date   CHOL 229 (H) 05/20/2020   HDL 35 (L) 05/20/2020   LDLCALC 163 (H) 05/20/2020   TRIG 155 (H) 05/20/2020   CHOLHDL 6.5 05/20/2020    Radiology    DG Chest 2 View  Result Date: 05/19/2020 CLINICAL DATA:  Chest pain EXAM: CHEST - 2 VIEW COMPARISON:  04/22/2012 FINDINGS: The heart size and mediastinal contours are within normal limits. Both lungs are clear. The visualized skeletal structures are unremarkable. IMPRESSION: No active cardiopulmonary disease. Electronically Signed   By: Jasmine Pang M.D.   On: 05/19/2020 17:18   CT ANGIO CHEST PE W OR WO CONTRAST  Result Date: 05/19/2020 CLINICAL DATA:  Chest pain, short of breath, recent prolonged travel, hypertension EXAM: CT ANGIOGRAPHY CHEST WITH CONTRAST TECHNIQUE: Multidetector CT imaging of the chest was performed using the standard protocol during bolus administration of intravenous contrast. Multiplanar CT image reconstructions and MIPs were obtained to evaluate the vascular anatomy. CONTRAST:  OMNIPAQUE IOHEXOL 350 MG/ML SOLN  COMPARISON:  05/19/2020 FINDINGS: Cardiovascular: The contrast bolus within the main pulmonary artery is adequate. However, evaluation of the segmental branches is limited due to mixing artifact, primarily within the left upper lobe, left lower lobe, and right lower lobe. I do not see any filling defects or pulmonary emboli within the opacified portions of the pulmonary artery. The heart is unremarkable without pericardial effusion. Mild coronary artery atherosclerosis. Mediastinum/Nodes: There is a 12 mm short axis right paratracheal lymph node, nonspecific. No other pathologic adenopathy. Thyroid, trachea, and esophagus are normal. Lungs/Pleura: No airspace disease, effusion, or pneumothorax. Central airways are patent. Upper Abdomen: No acute abnormality. Musculoskeletal: No acute or destructive bony lesions. Reconstructed images demonstrate no additional findings. Review of the MIP images confirms the above findings. IMPRESSION: 1. Technically limited evaluation of the pulmonary vasculature due to contrast bolus and mixing artifact within the segmental branches of the left upper, left lower, and right lower lobes as above. Within the opacified portions of the vascular tree, there are no filling defects or pulmonary emboli. 2. Nonspecific 12 mm right paratracheal lymph node. 3. Otherwise no acute intrathoracic process. Electronically Signed   By: Sharlet Salina M.D.   On: 05/19/2020 23:22   DG Chest Port 1 View  Result Date: 05/20/2020 CLINICAL DATA:  Chest pain which began at 4 p.m. yesterday, since subsided EXAM: PORTABLE CHEST 1 VIEW COMPARISON:  CT 05/19/2020, radiograph 05/19/2020 FINDINGS: Mild streaky basilar atelectatic changes. Lungs are otherwise clear. Stable cardiomediastinal contours accounting for differences in technique. No pneumothorax or visible effusion. No acute osseous or soft tissue abnormality. Degenerative changes are present in the imaged spine and shoulders. Telemetry leads overlie  the chest. IMPRESSION: Mild streaky basilar atelectatic changes. No other acute cardiopulmonary abnormality. Electronically Signed   By: Kreg Shropshire M.D.   On: 05/20/2020 01:36   Cardiac Studies   ECHO:  Ordered  CATH: Ordered  Patient Profile     54 y.o. male w/ hx HTN, HLD, morbid obesity, OSA, was admitted 07/01 w/ NSTEMI.   Assessment & Plan    1. NSTEMI - currently pain-free - on ASA, high-dose statin, BB, ACE - cath this am - Cardiac catheterization was discussed with the patient fully. The patient understands that risks include but are not limited to stroke (1 in 1000), death (1 in 1000), kidney failure [usually temporary] (1 in 500), bleeding (1 in 200), allergic reaction [  possibly serious] (1 in 200).  The patient understands and is willing to proceed.    2. HTN - was not on rx pta - now on Lopressor 25 mg bid, captopril 6.25 mg q 8 h - got doses overnight, give rx now since BP elevated  3. HLD - Lipitor 80 mg qd is new - LFTs are ok  Principal Problem:   NSTEMI (non-ST elevated myocardial infarction) (HCC) Active Problems:   OSA on CPAP   Obesity, morbid (HCC)   Hyperlipidemia LDL goal <70   Hypertension  Signed, Theodore Demark , PA-C 8:26 AM 05/20/2020 Pager: [phone] As above, patient seen and examined.  Patient admitted with non-ST elevation myocardial infarction.  He is presently pain-free.  Plan to continue aspirin, heparin, metoprolol and statin.  Proceed with cardiac catheterization today.  The risks and benefits including myocardial infarction, CVA and death discussed and he agrees to proceed.  His blood pressure is elevated.  Continue metoprolol.  Discontinue captopril and add losartan 50 mg daily.  Increase medications as needed.  His LDL was also elevated.  Continue Lipitor.  Check lipids and liver in 12 weeks. Olga Millers, MD

## 2020-05-21 ENCOUNTER — Encounter (HOSPITAL_COMMUNITY): Payer: Self-pay | Admitting: Student in an Organized Health Care Education/Training Program

## 2020-05-21 ENCOUNTER — Inpatient Hospital Stay (HOSPITAL_COMMUNITY): Payer: BC Managed Care – PPO

## 2020-05-21 DIAGNOSIS — I5030 Unspecified diastolic (congestive) heart failure: Secondary | ICD-10-CM

## 2020-05-21 DIAGNOSIS — M79605 Pain in left leg: Secondary | ICD-10-CM

## 2020-05-21 DIAGNOSIS — I2510 Atherosclerotic heart disease of native coronary artery without angina pectoris: Secondary | ICD-10-CM

## 2020-05-21 LAB — CBC
HCT: 44.4 % (ref 39.0–52.0)
Hemoglobin: 14.5 g/dL (ref 13.0–17.0)
MCH: 28 pg (ref 26.0–34.0)
MCHC: 32.7 g/dL (ref 30.0–36.0)
MCV: 85.9 fL (ref 80.0–100.0)
Platelets: 175 10*3/uL (ref 150–400)
RBC: 5.17 MIL/uL (ref 4.22–5.81)
RDW: 14.5 % (ref 11.5–15.5)
WBC: 8.9 10*3/uL (ref 4.0–10.5)
nRBC: 0 % (ref 0.0–0.2)

## 2020-05-21 LAB — BASIC METABOLIC PANEL
Anion gap: 10 (ref 5–15)
BUN: 9 mg/dL (ref 6–20)
CO2: 22 mmol/L (ref 22–32)
Calcium: 9 mg/dL (ref 8.9–10.3)
Chloride: 106 mmol/L (ref 98–111)
Creatinine, Ser: 0.94 mg/dL (ref 0.61–1.24)
GFR calc Af Amer: >60 mL/min (ref >60)
GFR calc non Af Amer: >60 mL/min (ref >60)
Glucose, Bld: 103 mg/dL — ABNORMAL HIGH (ref 70–99)
Potassium: 3.8 mmol/L (ref 3.5–5.1)
Sodium: 138 mmol/L (ref 135–145)

## 2020-05-21 MED ORDER — METOPROLOL TARTRATE 25 MG PO TABS: 25.0000 mg | ORAL_TABLET | Freq: Two times a day (BID) | ORAL | 6 refills | Status: DC

## 2020-05-21 MED ORDER — NITROGLYCERIN 0.4 MG SL SUBL: 0.4000 mg | SUBLINGUAL_TABLET | SUBLINGUAL | 3 refills | Status: DC | PRN

## 2020-05-21 MED ORDER — LOSARTAN POTASSIUM 50 MG PO TABS
100.0000 mg | ORAL_TABLET | Freq: Every day | ORAL | Status: DC
  Administered 2020-05-21: 100 mg via ORAL
  Filled 2020-05-21: qty 2

## 2020-05-21 MED ORDER — FUROSEMIDE 20 MG PO TABS
20.0000 mg | ORAL_TABLET | Freq: Every day | ORAL | Status: DC
  Administered 2020-05-21: 20 mg via ORAL
  Filled 2020-05-21: qty 1

## 2020-05-21 MED ORDER — FUROSEMIDE 20 MG PO TABS: 20.0000 mg | ORAL_TABLET | Freq: Every day | ORAL | 6 refills | Status: DC

## 2020-05-21 MED ORDER — PRASUGREL HCL 10 MG PO TABS: 10.0000 mg | ORAL_TABLET | Freq: Every day | ORAL | 6 refills | Status: DC

## 2020-05-21 MED ORDER — ATORVASTATIN CALCIUM 80 MG PO TABS: 80.0000 mg | ORAL_TABLET | Freq: Every day | ORAL | 6 refills | Status: DC

## 2020-05-21 MED ORDER — ASPIRIN 81 MG PO TBEC: 81.0000 mg | DELAYED_RELEASE_TABLET | Freq: Every day | ORAL | 6 refills | Status: DC

## 2020-05-21 MED ORDER — LOSARTAN POTASSIUM 100 MG PO TABS: 100.0000 mg | ORAL_TABLET | Freq: Every day | ORAL | 6 refills | Status: DC

## 2020-05-21 NOTE — Discharge Instructions (Signed)
**PLEASE REMEMBER TO BRING ALL OF YOUR MEDICATIONS TO EACH OF YOUR FOLLOW-UP OFFICE VISITS.  NO HEAVY LIFTING X 2 WEEKS. NO SEXUAL ACTIVITY X 2 WEEKS. NO DRIVING X 1 WEEK. NO SOAKING BATHS, HOT TUBS, POOLS, ETC., X 7 DAYS.   Radial Site Care Refer to this sheet in the next few weeks. These instructions provide you with information on caring for yourself after your procedure. Your caregiver may also give you more specific instructions. Your treatment has been planned according to current medical practices, but problems sometimes occur. Call your caregiver if you have any problems or questions after your procedure. HOME CARE INSTRUCTIONS  You may shower the day after the procedure.Remove the bandage (dressing) and gently wash the site with plain soap and water.Gently pat the site dry.   Do not apply powder or lotion to the site.   Do not submerge the affected site in water for 3 to 5 days.   Inspect the site at least twice daily.   Do not flex or bend the affected arm for 24 hours.   No lifting over 5 pounds (2.3 kg) for 5 days after your procedure.   Do not drive home if you are discharged the same day of the procedure. Have someone else drive you.   What to expect:  Any bruising will usually fade within 1 to 2 weeks.   Blood that collects in the tissue (hematoma) may be painful to the touch. It should usually decrease in size and tenderness within 1 to 2 weeks.  SEEK IMMEDIATE MEDICAL CARE IF:  You have unusual pain at the radial site.   You have redness, warmth, swelling, or pain at the radial site.   You have drainage (other than a small amount of blood on the dressing).   You have chills.   You have a fever or persistent symptoms for more than 72 hours.   You have a fever and your symptoms suddenly get worse.   Your arm becomes pale, cool, tingly, or numb.   You have heavy bleeding from the site. Hold pressure on the site.   _____________      10 Habits of  Highly Healthy People  [facility] wants to help you get well and stay well.  Live a longer, healthier life by practicing healthy habits every day.  1.  Visit your primary care provider regularly. 2.  Make time for family and friends.  Healthy relationships are important. 3.  Take medications as directed by your provider. 4.  Maintain a healthy weight and a trim waistline. 5.  Eat healthy meals and snacks, rich in fruits, vegetables, whole grains, and lean proteins. 6.  Get moving every day - aim for 150 minutes of moderate physical activity each week. 7.  Don't smoke. 8.  Avoid alcohol or drink in moderation. 9.  Manage stress through meditation or mindful relaxation. 10.  Get seven to nine hours of quality sleep each night.  Want more information on healthy habits?  To learn more about these and other healthy habits, visit [email or web address]. _____________       You have received care from  [facility] during this hospital stay and we look forward to continuing to provide you with excellent care in our office settings after you've left the hospital.  In order to assure a smoother transition to home following your discharge from the hospital, we will likely have you see one of our nurse practitioners or physician assistants within a   few weeks of discharge.  Our advanced practice providers work closely with your physician in order to address all of your heart's needs in a timely manner.  More information about all of our providers may be found here: [email or web address]  Please plan to bring all of your prescriptions to your follow-up appointment and don't hesitate to contact us with questions or concerns.  [facility] [place] - [phone] [facility] [place] - [phone] [facility] [address] - [phone] [facility] [place] - [phone] [facility] [place] - [phone] [facility] [place] - [phone] [facility] [place] - [phone] [facility] [place] - [phone] [facility] [place] - [phone]  

## 2020-05-21 NOTE — Progress Notes (Signed)
Lower extremity venous has been completed.   Preliminary results in CV Proc.   Blanch Media 05/21/2020 9:15 AM

## 2020-05-21 NOTE — Discharge Summary (Signed)
Discharge Summary    Patient ID: Michael [name] MRN: [id number]; DOB: 1966/09/21  Admit date: 05/19/2020 Discharge date: 05/21/2020  Primary Care Provider: Ardith Dark, MD  Primary Cardiologist: Olga Millers, MD  Primary Electrophysiologist:  None   Discharge Diagnoses    Principal Problem:   NSTEMI (non-ST elevated myocardial infarction) [facility])  **S/p PCI/DES to the distal RCA this admission. Active Problems:   CAD (coronary artery disease)   Obesity, morbid (HCC)   Hypertension   (HFpEF) heart failure with preserved ejection fraction (HCC)   OSA on CPAP   Hyperlipidemia LDL goal <70  Diagnostic Studies/Procedures    2D Echocardiogram 7.2.2021   1. Left ventricular ejection fraction, by estimation, is 60%. The left  ventricle has normal function. The left ventricle has no regional wall  motion abnormalities. There is moderate left ventricular hypertrophy. Left  ventricular diastolic parameters are   consistent with Grade II diastolic dysfunction (pseudonormalization).   2. Right ventricular systolic function is normal. The right ventricular  size is normal. Tricuspid regurgitation signal is inadequate for assessing  PA pressure.   3. The mitral valve is grossly normal. Trivial mitral valve  regurgitation. No evidence of mitral stenosis.   4. The aortic valve is tricuspid. Aortic valve regurgitation is not  visualized. No aortic stenosis is present.   5. The inferior vena cava is dilated in size with >50% respiratory  variability, suggesting right atrial pressure of 8 mmHg.  _____________   Cardiac Catheterization and Percutaneous Coronary Intervention 7.2.2021  Left Main  Vessel is large. Vessel is angiographically normal.  Left Anterior Descending  Vessel is large.  First Diagonal Branch  Vessel is moderate in size.  Second Diagonal Branch  Vessel is large in size.  Third Diagonal Branch  Vessel is small in size.  Left Circumflex  Vessel is large.  First  Obtuse Marginal Branch  Vessel is small in size.  Second Obtuse Marginal Branch  Vessel is small in size.  Lateral Third Obtuse Marginal Branch  Vessel is moderate in size.  Right Coronary Artery  Vessel is large.  Mid RCA lesion is 45% stenosed.  Dist RCA lesion is 85% stenosed. The lesion is ulcerative.      **The distal right coronary artery was successfully treated using a 3.5 x 26 mm resolute Onyx drug-eluting stent**  Right Posterior Descending Artery  Vessel is large in size.  Right Posterior Atrioventricular Artery  Vessel is large in size.   Left Ventricle The left ventricular size is normal. The left ventricular systolic function is normal. LV end diastolic pressure is moderately elevated. LVEDP ~30 mmHg. The left ventricular ejection fraction is 50-55% by visual estimate. There are LV function abnormalities due to segmental dysfunction.   History of Present Illness     Michael [name] is a 54 y.o. male with hypertension, obesity, and sleep apnea who was in his usual state of health until the afternoon of July 1, when he developed central chest tightness with subsequent radiation to the left shoulder and jaw.  After several hours of ongoing symptoms, he presented to the emergency department for further evaluation.  There, CT angio of the chest was performed as he reported a recent 10-hour drive from [place].  This was negative for pulmonary embolism.  Initial high-sensitivity troponin was only 12 but subsequently rose to 437.  He was placed on heparin with improvement in symptoms, and admitted for management of non-STEMI.  Hospital Course     Consultants: None  High-sensitivity troponin eventually peaked at 1801.  He underwent diagnostic catheterization on July 2 revealing an 85% stenosis in the distal RCA with otherwise normal coronary arteries.  The distal RCA was successfully treated with a 3.5 x 26 mm resolute Onyx drug-eluting stent.  This was postdilated to 4.1 mm.  EF is  50-55%.  His LVEDP was moderately elevated at 30 mmHg.  Post procedure, he was placed on oral diuretic therapy along with aspirin, Effient, beta-blocker, losartan, and high potency statin therapy.  Echocardiogram was performed and showed an EF of 60% with grade 2 diastolic dysfunction.  No significant valvular abnormalities were noted.  He reported left posterior calf pain with associated swelling given recent prolonged travel, lower extremity venous duplex was performed this morning and was negative for DVT.  He has remained hypertensive throughout admission allowing [place] to titrate losartan to 100 mg daily.  He will be discharged home this morning in good condition with plan for early outpatient follow-up.  He will require repeat basic metabolic panel in approximately 1 week given initiation and titration of losartan therapy.  Did the patient have an acute coronary syndrome (MI, NSTEMI, STEMI, etc) this admission?:  Yes                               AHA/ACC Clinical Performance & Quality Measures: 1. Aspirin prescribed? - Yes 2. ADP Receptor Inhibitor (Plavix/Clopidogrel, Brilinta/Ticagrelor or Effient/Prasugrel) prescribed (includes medically managed patients)? - Yes 3. Beta Blocker prescribed? - Yes 4. High Intensity Statin (Lipitor 40-80mg  or Crestor 20-40mg ) prescribed? - Yes 5. EF assessed during THIS hospitalization? - Yes 6. For EF <40%, was ACEI/ARB prescribed? - Not Applicable (EF >/= 40%) 7. For EF <40%, Aldosterone Antagonist (Spironolactone or Eplerenone) prescribed? - Not Applicable (EF >/= 40%) 8. Cardiac Rehab Phase II ordered (including medically managed patients)? - Yes   _____________  Discharge Vitals Blood pressure (!) 181/98, pulse 61, temperature 98.5 F (36.9 C), temperature source Oral, resp. rate 18, height 5\' 8"  (1.727 m), weight (!) 153.7 kg, SpO2 96 %.  Filed Weights   05/20/20 0805 05/21/20 0403  Weight: 136.1 kg (!) 153.7 kg    Labs & Radiologic Studies      CBC Recent Labs    05/19/20 1639 05/21/20 0701  WBC 8.1 8.9  HGB 15.6 14.5  HCT 47.5 44.4  MCV 86.1 85.9  PLT 188 175   Basic Metabolic Panel Recent Labs    07/22/20 1639 05/21/20 0701  NA 140 138  K 3.8 3.8  CL 106 106  CO2 24 22  GLUCOSE 109* 103*  BUN 14 9  CREATININE 0.93 0.94  CALCIUM 9.5 9.0   Liver Function Tests Recent Labs    05/19/20 1013  AST 28  ALT 36  ALKPHOS 68  BILITOT 0.5  PROT 6.6  ALBUMIN 4.5   High Sensitivity Troponin:   Recent Labs  Lab 05/19/20 1639 05/19/20 1952 05/19/20 2333 05/20/20 0123  TROPONINIHS 12 437* 1,801* 1,706*    Hemoglobin A1C Recent Labs    05/19/20 1639  HGBA1C 5.3   Fasting Lipid Panel Recent Labs    05/20/20 0150  CHOL 229*  HDL 35*  LDLCALC 163*  TRIG 155*  CHOLHDL 6.5   Thyroid Function Tests Recent Labs    05/20/20 0159  TSH 4.002   _____________  DG Chest 2 View  Result Date: 05/19/2020 CLINICAL DATA:  Chest pain EXAM: CHEST - 2 VIEW  COMPARISON:  04/22/2012 FINDINGS: The heart size and mediastinal contours are within normal limits. Both lungs are clear. The visualized skeletal structures are unremarkable. IMPRESSION: No active cardiopulmonary disease. Electronically Signed   By: Jasmine PangKim  Fujinaga M.D.   On: 05/19/2020 17:18   CT ANGIO CHEST PE W OR WO CONTRAST  Result Date: 05/19/2020 CLINICAL DATA:  Chest pain, short of breath, recent prolonged travel, hypertension EXAM: CT ANGIOGRAPHY CHEST WITH CONTRAST TECHNIQUE: Multidetector CT imaging of the chest was performed using the standard protocol during bolus administration of intravenous contrast. Multiplanar CT image reconstructions and MIPs were obtained to evaluate the vascular anatomy. CONTRAST:  100mL OMNIPAQUE IOHEXOL 350 MG/ML SOLN COMPARISON:  05/19/2020 FINDINGS: Cardiovascular: The contrast bolus within the main pulmonary artery is adequate. However, evaluation of the segmental branches is limited due to mixing artifact, primarily within the  left upper lobe, left lower lobe, and right lower lobe. I do not see any filling defects or pulmonary emboli within the opacified portions of the pulmonary artery. The heart is unremarkable without pericardial effusion. Mild coronary artery atherosclerosis. Mediastinum/Nodes: There is a 12 mm short axis right paratracheal lymph node, nonspecific. No other pathologic adenopathy. Thyroid, trachea, and esophagus are normal. Lungs/Pleura: No airspace disease, effusion, or pneumothorax. Central airways are patent. Upper Abdomen: No acute abnormality. Musculoskeletal: No acute or destructive bony lesions. Reconstructed images demonstrate no additional findings. Review of the MIP images confirms the above findings. IMPRESSION: 1. Technically limited evaluation of the pulmonary vasculature due to contrast bolus and mixing artifact within the segmental branches of the left upper, left lower, and right lower lobes as above. Within the opacified portions of the vascular tree, there are no filling defects or pulmonary emboli. 2. Nonspecific 12 mm right paratracheal lymph node. 3. Otherwise no acute intrathoracic process. Electronically Signed   By: Sharlet SalinaMichael  Brown M.D.   On: 05/19/2020 23:22   DG Chest Port 1 View  Result Date: 05/20/2020 CLINICAL DATA:  Chest pain which began at 4 p.m. yesterday, since subsided EXAM: PORTABLE CHEST 1 VIEW COMPARISON:  CT 05/19/2020, radiograph 05/19/2020 FINDINGS: Mild streaky basilar atelectatic changes. Lungs are otherwise clear. Stable cardiomediastinal contours accounting for differences in technique. No pneumothorax or visible effusion. No acute osseous or soft tissue abnormality. Degenerative changes are present in the imaged spine and shoulders. Telemetry leads overlie the chest. IMPRESSION: Mild streaky basilar atelectatic changes. No other acute cardiopulmonary abnormality. Electronically Signed   By: Kreg ShropshirePrice  DeHay M.D.   On: 05/20/2020 01:36   VAS US LOWER EXTREMITY VENOUS  (DVT)  Result Date: 05/21/2020  Lower Venous DVTStudy Indications: Pain.  Comparison Study: no prior Performing Technologist: Blanch MediaMegan Riddle RVS  Examination Guidelines: A complete evaluation includes B-mode imaging, spectral Doppler, color Doppler, and power Doppler as needed of all accessible portions of each vessel. Bilateral testing is considered an integral part of a complete examination. Limited examinations for reoccurring indications may be performed as noted. The reflux portion of the exam is performed with the patient in reverse Trendelenburg.    Summary: RIGHT: - No evidence of common femoral vein obstruction.  LEFT: - There is no evidence of deep vein thrombosis in the lower extremity.  - No cystic structure found in the popliteal fossa.    Disposition   Pt is being discharged home today in good condition.  Follow-up Plans & Appointments     Follow-up Information    Lewayne BuntingCrenshaw, Brian S, MD Follow up in 1 week(s).   Specialty: Cardiology Why: We will arrange  for follow-up and contact you. Contact information: [address] [place] [address] [phone]              Discharge Instructions    Amb Referral to Cardiac Rehabilitation   Complete by: As directed    Diagnosis:  NSTEMI Coronary Stents     After initial evaluation and assessments completed: Virtual Based Care may be provided alone or in conjunction with Phase 2 Cardiac Rehab based on patient barriers.: Yes      Discharge Medications   Allergies as of 05/21/2020   No Known Allergies     Medication List    STOP taking these medications   neomycin-polymyxin-hydrocortisone OTIC solution Commonly known as: CORTISPORIN   sildenafil 20 MG tablet Commonly known as: REVATIO     TAKE these medications   aspirin 81 MG EC tablet Take 1 tablet (81 mg total) by mouth daily. Swallow whole. Start taking on: May 22, 2020   atorvastatin 80 MG tablet Commonly known as: LIPITOR Take 1 tablet (80 mg  total) by mouth at bedtime.   cephALEXin 500 MG capsule Commonly known as: KEFLEX Take 500 mg by mouth 3 (three) times daily.   folic acid 1 MG tablet Commonly known as: FOLVITE Take 1 mg by mouth daily.   furosemide 20 MG tablet Commonly known as: LASIX Take 1 tablet (20 mg total) by mouth daily. Start taking on: May 22, 2020   hydrOXYzine 50 MG tablet Commonly known as: ATARAX/VISTARIL TAKE 1/2-2 TABLETS BY MOUTH AT BEDTIME AS NEEDED (INSOMNIA). What changed: See the new instructions.   losartan 100 MG tablet Commonly known as: COZAAR Take 1 tablet (100 mg total) by mouth daily. Start taking on: May 22, 2020   methotrexate 2.5 MG tablet Commonly known as: RHEUMATREX Take 20 mg by mouth once a week. Tuesday   metoprolol tartrate 25 MG tablet Commonly known as: LOPRESSOR Take 1 tablet (25 mg total) by mouth 2 (two) times daily.   multivitamin capsule Take 1 capsule by mouth daily.   nitroGLYCERIN 0.4 MG SL tablet Commonly known as: NITROSTAT Place 1 tablet (0.4 mg total) under the tongue every 5 (five) minutes x 3 doses as needed for chest pain.   prasugrel 10 MG Tabs tablet Commonly known as: EFFIENT Take 1 tablet (10 mg total) by mouth daily. Start taking on: May 22, 2020   terbinafine 250 MG tablet Commonly known as: LamISIL Take 1 tablet (250 mg total) by mouth daily.         Outstanding Labs/Studies   F/u BMET @ office follow-up within next 7 days.  Follow-up lipids LFTs in 12 weeks.  Duration of Discharge Encounter   Greater than 30 minutes including physician time.  Signed, Nicolasa Ducking, NP 05/21/2020, 12:13 PM

## 2020-05-21 NOTE — Progress Notes (Signed)
Progress Note  Patient Name: Michael [name] Date of Encounter: 05/21/2020  [facility] Cardiologist: Olga Millers, MD   Subjective   No CP or dyspnea  Inpatient Medications    Scheduled Meds: . aspirin EC  81 mg Oral Daily  . atorvastatin  80 mg Oral QHS  . enoxaparin (LOVENOX) injection  40 mg Subcutaneous Q24H  . folic acid  1 mg Oral Daily  . losartan  50 mg Oral Daily  . metoprolol tartrate  25 mg Oral BID  . prasugrel  10 mg Oral Daily  . sodium chloride flush  3 mL Intravenous Q12H  . sodium chloride flush  3 mL Intravenous Q12H  . terbinafine  250 mg Oral Daily   Continuous Infusions: . sodium chloride Stopped (05/20/20 0101)  . sodium chloride    . sodium chloride     PRN Meds: sodium chloride, sodium chloride, acetaminophen, alum & mag hydroxide-simeth, nitroGLYCERIN, ondansetron (ZOFRAN) IV, sodium chloride flush, sodium chloride flush   Vital Signs    Vitals:   05/20/20 2000 05/20/20 2124 05/20/20 2352 05/21/20 0403  BP: (!) 189/95 (!) 176/103 (!) 161/86 (!) 162/97  Pulse: 73 70  64  Resp: Temp: 98.5 F (36.9 C)  98 F (36.7 C) 98.5 F (36.9 C)  TempSrc: Oral  Oral Oral  SpO2: 99%  98% 96%  Weight:    (!) 153.7 kg  Height:        Intake/Output Summary (Last 24 hours) at 05/21/2020 0738 Last data filed at 05/21/2020 0719 Gross per 24 hour  Intake --  Output 2650 ml  Net -2650 ml   Last 3 Weights 05/21/2020 05/20/2020 10/02/2018  Weight (lbs) 338 lb 12.8 oz 300 lb 316 lb 3.2 oz  Weight (kg) 153.679 kg 136.079 kg 143.427 kg      Telemetry    Sinus- Personally Reviewed  Physical Exam   GEN: No acute distress.  Obese Neck: No JVD Cardiac: RRR, no murmurs, rubs, or gallops.  Respiratory: Clear to auscultation bilaterally. GI: Soft, nontender, non-distended  MS: No edema; radial cath site with no hematoma Neuro:  Nonfocal  Psych: Normal affect   Labs    High Sensitivity Troponin:   Recent Labs  Lab 05/19/20 1639  05/19/20 1952 05/19/20 2333 05/20/20 0123  TROPONINIHS 12 437* 1,801* 1,706*      Chemistry Recent Labs  Lab 05/19/20 1013 05/19/20 1639  NA  --  140  K  --  3.8  CL  --  106  CO2  --  24  GLUCOSE  --  109*  BUN  --  14  CREATININE  --  0.93  CALCIUM  --  9.5  PROT 6.6  --   ALBUMIN 4.5  --   AST 28  --   ALT 36  --   ALKPHOS 68  --   BILITOT 0.5  --   GFRNONAA  --  >60  GFRAA  --  >60  ANIONGAP  --  10     Hematology Recent Labs  Lab 05/19/20 1639  WBC 8.1  RBC 5.52  HGB 15.6  HCT 47.5  MCV 86.1  MCH 28.3  MCHC 32.8  RDW 14.3  PLT 188    Radiology    DG Chest 2 View  Result Date: 05/19/2020 CLINICAL DATA:  Chest pain EXAM: CHEST - 2 VIEW COMPARISON:  04/22/2012 FINDINGS: The heart size and mediastinal contours are within normal limits. Both lungs are clear.  The visualized skeletal structures are unremarkable. IMPRESSION: No active cardiopulmonary disease. Electronically Signed   By: Jasmine PangKim  Fujinaga M.D.   On: 05/19/2020 17:18   CT ANGIO CHEST PE W OR WO CONTRAST  Result Date: 05/19/2020 CLINICAL DATA:  Chest pain, short of breath, recent prolonged travel, hypertension EXAM: CT ANGIOGRAPHY CHEST WITH CONTRAST TECHNIQUE: Multidetector CT imaging of the chest was performed using the standard protocol during bolus administration of intravenous contrast. Multiplanar CT image reconstructions and MIPs were obtained to evaluate the vascular anatomy. CONTRAST:  100mL OMNIPAQUE IOHEXOL 350 MG/ML SOLN COMPARISON:  05/19/2020 FINDINGS: Cardiovascular: The contrast bolus within the main pulmonary artery is adequate. However, evaluation of the segmental branches is limited due to mixing artifact, primarily within the left upper lobe, left lower lobe, and right lower lobe. I do not see any filling defects or pulmonary emboli within the opacified portions of the pulmonary artery. The heart is unremarkable without pericardial effusion. Mild coronary artery atherosclerosis.  Mediastinum/Nodes: There is a 12 mm short axis right paratracheal lymph node, nonspecific. No other pathologic adenopathy. Thyroid, trachea, and esophagus are normal. Lungs/Pleura: No airspace disease, effusion, or pneumothorax. Central airways are patent. Upper Abdomen: No acute abnormality. Musculoskeletal: No acute or destructive bony lesions. Reconstructed images demonstrate no additional findings. Review of the MIP images confirms the above findings. IMPRESSION: 1. Technically limited evaluation of the pulmonary vasculature due to contrast bolus and mixing artifact within the segmental branches of the left upper, left lower, and right lower lobes as above. Within the opacified portions of the vascular tree, there are no filling defects or pulmonary emboli. 2. Nonspecific 12 mm right paratracheal lymph node. 3. Otherwise no acute intrathoracic process. Electronically Signed   By: Sharlet SalinaMichael  Brown M.D.   On: 05/19/2020 23:22   CARDIAC CATHETERIZATION  Result Date: 05/20/2020 Conclusions: 1. Severe single-vessel coronary artery disease with sequential 40-50% mid and 80-90% distal RCA stenoses.  The distal RCA lesion appears ulcerated and is likely the culprit lesion for the patient's NSTEMI. 2. No angiographically significant coronary artery disease involving the left coronary artery. 3. Low normal left ventricular systolic function with basal and mid inferior hypokinesis.  Left ventricular filling pressure is moderately elevated (LVEDP ~30 mmHg). 4. Successful PCI to the distal RCA using Resolute Onyx 3.5 x 26 mm drug-eluting stent (postdilated to 4.1 mm) with 0% residual stenosis and TIMI-3 flow. Recommendations: 1. Dual antiplatelet therapy with aspirin and prasugrel for at least 12 months. 2. Aggressive secondary prevention. 3. Gentle diuresis in the setting of moderately elevated LVEDP. Yvonne Kendallhristopher End, MD [facility]   DG Chest Port 1 View  Result Date: 05/20/2020 CLINICAL DATA:  Chest pain which  began at 4 p.m. yesterday, since subsided EXAM: PORTABLE CHEST 1 VIEW COMPARISON:  CT 05/19/2020, radiograph 05/19/2020 FINDINGS: Mild streaky basilar atelectatic changes. Lungs are otherwise clear. Stable cardiomediastinal contours accounting for differences in technique. No pneumothorax or visible effusion. No acute osseous or soft tissue abnormality. Degenerative changes are present in the imaged spine and shoulders. Telemetry leads overlie the chest. IMPRESSION: Mild streaky basilar atelectatic changes. No other acute cardiopulmonary abnormality. Electronically Signed   By: Kreg ShropshirePrice  DeHay M.D.   On: 05/20/2020 01:36   ECHOCARDIOGRAM COMPLETE  Result Date: 05/20/2020    ECHOCARDIOGRAM REPORT   [facility] Date of Exam: 05/20/2020 Medical Rec #:  [id number]      Height:       68.0 in Accession #:    [id number]  Weight:       300.0 lb Date of Birth:  Apr 12, 1966      BSA:          2.428 m Patient Age:    54 years       BP:           166/106 mmHg Patient Gender: M              HR:           60 bpm. Exam Location:  Inpatient Procedure: 2D Echo, Cardiac Doppler, Color Doppler and Intracardiac            Opacification Agent Indications:    Acute Coronary Syndrome  History:        Patient has no prior history of Echocardiogram examinations.                 Risk Factors:Sleep Apnea, Hypertension and Dyslipidemia.  Sonographer:    Ross Ludwig RDCS (AE) Referring Phys: [id number] MATTHEW A CARLISLE IMPRESSIONS  1. Left ventricular ejection fraction, by estimation, is 60%. The left ventricle has normal function. The left ventricle has no regional wall motion abnormalities. There is moderate left ventricular hypertrophy. Left ventricular diastolic parameters are  consistent with Grade II diastolic dysfunction (pseudonormalization).  2. Right ventricular systolic function is normal. The right ventricular size is normal. Tricuspid regurgitation signal is inadequate for assessing PA pressure.  3. The mitral valve  is grossly normal. Trivial mitral valve regurgitation. No evidence of mitral stenosis.  4. The aortic valve is tricuspid. Aortic valve regurgitation is not visualized. No aortic stenosis is present.  5. The inferior vena cava is dilated in size with >50% respiratory variability, suggesting right atrial pressure of 8 mmHg. FINDINGS  Left Ventricle: Left ventricular ejection fraction, by estimation, is 60%. The left ventricle has normal function. The left ventricle has no regional wall motion abnormalities. Definity contrast agent was given IV to delineate the left ventricular endocardial borders. The left ventricular internal cavity size was normal in size. There is moderate left ventricular hypertrophy. Left ventricular diastolic parameters are consistent with Grade II diastolic dysfunction (pseudonormalization). Right Ventricle: The right ventricular size is normal. No increase in right ventricular wall thickness. Right ventricular systolic function is normal. Tricuspid regurgitation signal is inadequate for assessing PA pressure. Left Atrium: Left atrial size was normal in size. Right Atrium: Right atrial size was normal in size. Pericardium: There is no evidence of pericardial effusion. Mitral Valve: The mitral valve is grossly normal. Normal mobility of the mitral valve leaflets. Trivial mitral valve regurgitation. No evidence of mitral valve stenosis. MV peak gradient, 4.2 mmHg. The mean mitral valve gradient is 1.0 mmHg. Tricuspid Valve: The tricuspid valve is normal in structure. Tricuspid valve regurgitation is trivial. No evidence of tricuspid stenosis. Aortic Valve: The aortic valve is tricuspid. Aortic valve regurgitation is not visualized. No aortic stenosis is present. Aortic valve mean gradient measures 3.0 mmHg. Aortic valve peak gradient measures 6.5 mmHg. Aortic valve area, by VTI measures 3.16 cm. Pulmonic Valve: The pulmonic valve was normal in structure. Pulmonic valve regurgitation is not  visualized. No evidence of pulmonic stenosis. Aorta: The aortic root is normal in size and structure. Venous: The inferior vena cava is dilated in size with greater than 50% respiratory variability, suggesting right atrial pressure of 8 mmHg. IAS/Shunts: The interatrial septum was not well visualized.  LEFT VENTRICLE PLAX 2D LVIDd:         4.10 cm  Diastology LVIDs:  3.10 cm  LV e' lateral:   6.45 cm/s LV PW:         1.70 cm  LV E/e' lateral: 14.9 LV IVS:        1.50 cm  LV e' medial:    6.68 cm/s LVOT diam:     2.10 cm  LV E/e' medial:  14.4 LV SV:         77 LV SV Index:   32 LVOT Area:     3.46 cm  RIGHT VENTRICLE             IVC RV Basal diam:  3.30 cm     IVC diam: 2.10 cm RV S prime:     13.60 cm/s TAPSE (M-mode): 2.4 cm LEFT ATRIUM             Index       RIGHT ATRIUM           Index LA diam:        3.50 cm 1.44 cm/m  RA Area:     14.60 cm LA Vol (A2C):   73.3 ml 30.19 ml/m RA Volume:   36.20 ml  14.91 ml/m LA Vol (A4C):   67.1 ml 27.63 ml/m LA Biplane Vol: 69.7 ml 28.71 ml/m  AORTIC VALVE AV Area (Vmax):    2.72 cm AV Area (Vmean):   2.92 cm AV Area (VTI):     3.16 cm AV Vmax:           127.00 cm/s AV Vmean:          83.300 cm/s AV VTI:            0.242 m AV Peak Grad:      6.5 mmHg AV Mean Grad:      3.0 mmHg LVOT Vmax:         99.80 cm/s LVOT Vmean:        70.300 cm/s LVOT VTI:          0.221 m LVOT/AV VTI ratio: 0.91  AORTA Ao Root diam: 3.50 cm Ao Asc diam:  3.70 cm MITRAL VALVE MV Area (PHT): 4.68 cm    SHUNTS MV Peak grad:  4.2 mmHg    Systemic VTI:  0.22 m MV Mean grad:  1.0 mmHg    Systemic Diam: 2.10 cm MV Vmax:       1.02 m/s MV Vmean:      52.7 cm/s MV Decel Time: 162 msec MV E velocity: 96.00 cm/s MV A velocity: 50.90 cm/s MV E/A ratio:  1.89 Weston Brass MD Electronically signed by Weston Brass MD Signature Date/Time: 05/20/2020/4:43:43 PM    Final     Patient Profile     54 y.o. male admitted with NSTEMI.  Cardiac catheterization revealed 40 to 50% mid RCA and 80 to  90% distal RCA.  No coronary disease on the left.  Elevated left ventricular end-diastolic pressure at 30.  Patient had PCI of the distal right coronary artery with drug-eluting stent.  Echocardiogram showed normal LV function, moderate left ventricular hypertrophy, grade 2 diastolic dysfunction.  Assessment & Plan    1 NSTEMI-patient doing well following PCI of RCA.  Continue aspirin, prasugrel and statin.  Continue metoprolol.  Note LV function normal.  2 hypertension-patient's blood pressure is elevated.  Increase losartan to 100 mg daily.  Add Lasix 20 mg daily given elevated left ventricular end-diastolic pressure.  Continue metoprolol.  Will need to monitor blood pressure as an outpatient and advance regimen  as needed.  3 hyperlipidemia-continue Lipitor 80 mg daily.  Check lipids and liver in 12 weeks.  4 lower extremity edema-likely secondary to CHF as left-ventricular end-diastolic pressure of 30 at time of catheterization.  Continue Lasix.  Lower extremity venous Dopplers are pending.  If lower extremity venous Dopplers negative plan discharge today on present medications.  Check potassium and renal function in 1 week.  Transition of care appointment with APP 1 week.  Follow-up with me in 3 months.  Greater than 30 minutes PA and physician time. D2  For questions or updates, please contact [facility] Please consult [email or web address] for contact info under        Signed, Olga Millers, MD  05/21/2020, 7:38 AM

## 2020-05-21 NOTE — Progress Notes (Signed)
CARDIAC REHAB PHASE I   PRE:  Rate/Rhythm: SR/62  BP:  Sitting: 166/100     SaO2: 96%   MODE:  Ambulation: 470 ft HR 94 SaO2 96% RA  POST:  Rate/Rhythm: NSR/82  BP:  Sitting: 174/98      SaO2: 96% RA  11:05 -12:07  Education on stent/Nstemi provided. Provided stent card. Pt has Heart Attack booklet. Provided information on low NA and heart healthy diet.  Stressed to take all medications, especially Effient/Asa. Reviewed activity restrictions s/p Nstemi/PCI. Reviewed wound care, activity restrictions, s/s to report to MD, and precautions. Provided activity guidelines and discussed s/s to terminate exercise. Reviewed use of NTG. Referral made to [facility] CRP2.  Pt agrees to ambulate. Pt walked with steady gait and had no symptoms. Pt back to chair with spouse at side. Call bell in reach. Pt/familyerbalize understanding of education.    Lorin Picket, MS, ACSM EP-C, [facility] 05/21/2020  11:56 AM

## 2020-05-24 ENCOUNTER — Telehealth: Payer: Self-pay | Admitting: Cardiology

## 2020-05-24 MED FILL — Nitroglycerin IV Soln 100 MCG/ML in D5W: INTRA_ARTERIAL | Qty: 10 | Status: AC

## 2020-05-24 NOTE — Telephone Encounter (Signed)
I attempted to contact patient 05/24/20 to schedule follow up visit with Dr.Crenshaw from stafff message that was received. LVM for patient to return call to get appt scheduled.

## 2020-05-24 NOTE — Telephone Encounter (Signed)
Spoke with pt, aware to continue to take effient.

## 2020-05-24 NOTE — Telephone Encounter (Signed)
Patient is requesting to speak with Dr. Ludwig Clarks nurse to discuss whether or not he should continue to take a blood thinner. Please advise.

## 2020-05-25 ENCOUNTER — Telehealth (HOSPITAL_COMMUNITY): Payer: Self-pay

## 2020-05-25 NOTE — Telephone Encounter (Signed)
Pt insurance is active and benefits verified through [employer]. Co-pay $0.00, DED $1,000.00/$83.87 met, out of pocket $6,000.00/$472.88 met, co-insurance 30%. No pre-authorization required. [employer], 05/25/20 @ 9:58AM, REF#[id number]  Will contact patient to see if he is interested in the Cardiac Rehab Program. If interested, patient will need to complete follow up appt. Once completed, patient will be contacted for scheduling upon review by the RN Navigator. 

## 2020-05-25 NOTE — Telephone Encounter (Signed)
Called patient to see if he is interested in the Cardiac Rehab Program. Patient expressed interest. Explained scheduling process and went over insurance, patient verbalized understanding. Will contact patient for scheduling once f/u has been completed.  °

## 2020-05-26 ENCOUNTER — Other Ambulatory Visit: Payer: Self-pay

## 2020-05-26 ENCOUNTER — Ambulatory Visit: Payer: BC Managed Care – PPO | Admitting: Cardiology

## 2020-05-26 ENCOUNTER — Encounter: Payer: Self-pay | Admitting: Cardiology

## 2020-05-26 VITALS — BP 156/97 | HR 67 | Resp 16 | Ht 68.0 in | Wt 339.0 lb

## 2020-05-26 DIAGNOSIS — E7800 Pure hypercholesterolemia, unspecified: Secondary | ICD-10-CM

## 2020-05-26 DIAGNOSIS — G4733 Obstructive sleep apnea (adult) (pediatric): Secondary | ICD-10-CM

## 2020-05-26 DIAGNOSIS — I10 Essential (primary) hypertension: Secondary | ICD-10-CM

## 2020-05-26 DIAGNOSIS — I5033 Acute on chronic diastolic (congestive) heart failure: Secondary | ICD-10-CM | POA: Diagnosis not present

## 2020-05-26 DIAGNOSIS — I25110 Atherosclerotic heart disease of native coronary artery with unstable angina pectoris: Secondary | ICD-10-CM | POA: Diagnosis not present

## 2020-05-26 MED ORDER — CHLORTHALIDONE 25 MG PO TABS
25.0000 mg | ORAL_TABLET | Freq: Every day | ORAL | 2 refills | Status: DC
Start: 2020-05-26 — End: 2020-08-08

## 2020-05-26 MED ORDER — AMLODIPINE BESYLATE-VALSARTAN 10-320 MG PO TABS: 1.0000 | ORAL_TABLET | Freq: Every day | ORAL | 2 refills | Status: DC

## 2020-05-26 NOTE — Progress Notes (Deleted)
Cardiology Clinic Note   Patient Name: Michael [name] Date of Encounter: 05/26/2020  Primary Care Provider:  Ardith Dark, MD Primary Cardiologist:  Olga Millers, MD  Patient Profile    [name] 54 year old male presents for a follow-up evaluation of his NSTEMI, hypertension, and HFpEF.  Past Medical History    Past Medical History:  Diagnosis Date  . (HFpEF) heart failure with preserved ejection fraction (HCC)    a. 05/2020 Echo: EF 60%, no rwma, Gr2 DD. Triv MR.  Marland Kitchen CAD (coronary artery disease)    a. 04/2020 NSTEMI/PCI: LM nl, LAD nl, LCX nl, RCA 82m, 85d (3.6x26 Resolute Onyx DES), RPDA nl, RPAV nl, EF 50-55%.  Marland Kitchen Hyperlipidemia LDL goal <70 08/28/2018  . Obesity hypoventilation syndrome (HCC)   . Sleep apnea   . Sleep apnea with use of continuous positive airway pressure (CPAP) 08/27/2013  . Viral meningitis    Past Surgical History:  Procedure Laterality Date  . CORONARY STENT INTERVENTION N/A 05/20/2020   Procedure: CORONARY STENT INTERVENTION;  Surgeon: Yvonne Kendall, MD;  Location: [facility];  Service: Cardiovascular;  Laterality: N/A;  . LEFT HEART CATH AND CORONARY ANGIOGRAPHY N/A 05/20/2020   Procedure: LEFT HEART CATH AND CORONARY ANGIOGRAPHY;  Surgeon: Yvonne Kendall, MD;  Location: [facility];  Service: Cardiovascular;  Laterality: N/A;    Allergies  No Known Allergies  History of Present Illness    Mr. Michael [name] has a PMH of hypertension, HLD, OSA on CPAP, and NSTEMI.  He presented to the emergency department on 05/19/2020 with chest tightness and radiation to his left shoulder/jaw.  He underwent cardiac catheterization 7-21 which showed single-vessel CAD, 80-90% RCA stenosis.  He received PCI with DES x1 to his RCA.  Echocardiogram 05/21/2020 showed an LVEF of 60% moderate LVH, G2 DD.  His EKG 05/21/2020 showed NSR 67 bpm.  He presents to the clinic today for follow-up evaluation and states***  *** denies chest pain, shortness of breath,  lower extremity edema, fatigue, palpitations, melena, hematuria, hemoptysis, diaphoresis, weakness, presyncope, syncope, orthopnea, and PND.   Home Medications    Prior to Admission medications   Medication Sig Start Date End Date Taking? Authorizing Provider  amLODipine-valsartan (EXFORGE) 10-320 MG tablet Take 1 tablet by mouth daily. 05/26/20   Yates Decamp, MD  aspirin EC 81 MG EC tablet Take 1 tablet (81 mg total) by mouth daily. Swallow whole. 05/22/20   Creig Hines, NP  atorvastatin (LIPITOR) 80 MG tablet Take 1 tablet (80 mg total) by mouth at bedtime. 05/21/20   Creig Hines, NP  cephALEXin (KEFLEX) 500 MG capsule Take 500 mg by mouth 3 (three) times daily.    [provider]  chlorthalidone (HYGROTON) 25 MG tablet Take 1 tablet (25 mg total) by mouth daily. 05/26/20 08/24/20  Yates Decamp, MD  folic acid (FOLVITE) 1 MG tablet Take 1 mg by mouth daily. 02/24/20   [provider]  furosemide (LASIX) 20 MG tablet Take 1 tablet (20 mg total) by mouth daily as needed for fluid (Leg swelling). 05/26/20   Yates Decamp, MD  methotrexate (RHEUMATREX) 2.5 MG tablet Take 20 mg by mouth once a week. Tuesday 08/31/19   [provider]  metoprolol tartrate (LOPRESSOR) 25 MG tablet Take 1 tablet (25 mg total) by mouth 2 (two) times daily. 05/21/20   Creig Hines, NP  Multiple Vitamin (MULTIVITAMIN) capsule Take 1 capsule by mouth daily.    [provider]  nitroGLYCERIN (  NITROSTAT) 0.4 MG SL tablet Place 1 tablet (0.4 mg total) under the tongue every 5 (five) minutes x 3 doses as needed for chest pain. 05/21/20   Creig Hines, NP  prasugrel (EFFIENT) 10 MG TABS tablet Take 1 tablet (10 mg total) by mouth daily. 05/22/20   Creig Hines, NP  terbinafine (LAMISIL) 250 MG tablet Take 1 tablet (250 mg total) by mouth daily. 04/15/20   Asencion Islam, DPM    Family History    Family History  Problem Relation Age of Onset  .  Alzheimer's disease Mother    He indicated that his mother is deceased. He indicated that his father is deceased. He indicated that his sister is alive.  Social History    Social History   Socioeconomic History  . Marital status: Divorced    Spouse name: Not on file  . Number of children: 1  . Years of education: 81  . Highest education level: Not on file  Occupational History    Employer: [employer]  Tobacco Use  . Smoking status: Never Smoker  . Smokeless tobacco: Never Used  Substance and Sexual Activity  . Alcohol use: Yes    Comment: rarely  . Drug use: No  . Sexual activity: Not on file  Other Topics Concern  . Not on file  Social History Narrative   Patient is single and lives alone.   Patient is working full-time.   Patient has a college education.   Patient is right-handed.   Patient drinks 3-4 cups of coffee daily.   Social Determinants of Health   Financial Resource Strain:   . Difficulty of Paying Living Expenses:   Food Insecurity:   . Worried About Programme researcher, broadcasting/film/video in the Last Year:   . Barista in the Last Year:   Transportation Needs:   . Freight forwarder (Medical):   Marland Kitchen Lack of Transportation (Non-Medical):   Physical Activity:   . Days of Exercise per Week:   . Minutes of Exercise per Session:   Stress:   . Feeling of Stress :   Social Connections:   . Frequency of Communication with Friends and Family:   . Frequency of Social Gatherings with Friends and Family:   . Attends Religious Services:   . Active Member of Clubs or Organizations:   . Attends Banker Meetings:   Marland Kitchen Marital Status:   Intimate Partner Violence:   . Fear of Current or Ex-Partner:   . Emotionally Abused:   Marland Kitchen Physically Abused:   . Sexually Abused:      Review of Systems    General:  No chills, fever, night sweats or weight changes.  Cardiovascular:  No chest pain, dyspnea on exertion, edema, orthopnea, palpitations, paroxysmal nocturnal  dyspnea. Dermatological: No rash, lesions/masses Respiratory: No cough, dyspnea Urologic: No hematuria, dysuria Abdominal:   No nausea, vomiting, diarrhea, bright red blood per rectum, melena, or hematemesis Neurologic:  No visual changes, wkns, changes in mental status. All other systems reviewed and are otherwise negative except as noted above.  Physical Exam    VS:  There were no vitals taken for this visit. , BMI There is no height or weight on file to calculate BMI. GEN: Well nourished, well developed, in no acute distress. HEENT: normal. Neck: Supple, no JVD, carotid bruits, or masses. Cardiac: RRR, no murmurs, rubs, or gallops. No clubbing, cyanosis, edema.  Radials/DP/PT 2+ and equal bilaterally.  Respiratory:  Respirations regular and unlabored,  clear to auscultation bilaterally. GI: Soft, nontender, nondistended, BS + x 4. MS: no deformity or atrophy. Skin: warm and dry, no rash. Neuro:  Strength and sensation are intact. Psych: Normal affect.  Accessory Clinical Findings    ECG personally reviewed by me today- *** - No acute changes  EKG 05/21/2020 Normal sinus rhythm inferior and lateral ischemia 67 bpm  Echocardiogram 05/21/2020 1. Left ventricular ejection fraction, by estimation, is 60%. The left  ventricle has normal function. The left ventricle has no regional wall  motion abnormalities. There is moderate left ventricular hypertrophy. Left  ventricular diastolic parameters are consistent with Grade II diastolic dysfunction (pseudonormalization).  2. Right ventricular systolic function is normal. The right ventricular  size is normal. Tricuspid regurgitation signal is inadequate for assessing  PA pressure.  3. The mitral valve is grossly normal. Trivial mitral valve  regurgitation. No evidence of mitral stenosis.  4. The aortic valve is tricuspid. Aortic valve regurgitation is not visualized. No aortic stenosis is present.  5. The inferior vena cava is dilated in  size with >50% respiratory  variability, suggesting right atrial pressure of 8 mmHg.   Cardiac catheterization 05/20/2020 1. Severe single-vessel coronary artery disease with sequential 40-50% mid and 80-90% distal RCA stenoses.  The distal RCA lesion appears ulcerated and is likely the culprit lesion for the patient's NSTEMI. 2. No angiographically significant coronary artery disease involving the left coronary artery. 3. Low normal left ventricular systolic function with basal and mid inferior hypokinesis.  Left ventricular filling pressure is moderately elevated (LVEDP ~30 mmHg). 4. Successful PCI to the distal RCA using Resolute Onyx 3.5 x 26 mm drug-eluting stent (postdilated to 4.1 mm) with 0% residual stenosis and TIMI-3 flow.  Recommendations: 1. Dual antiplatelet therapy with aspirin and prasugrel for at least 12 months. 2. Aggressive secondary prevention. 3. Gentle diuresis in the setting of moderately elevated LVEDP.  Diagnostic Dominance: Right  Intervention     Assessment & Plan   1.  NSTEMI-presented to the emergency department 05/19/2020 with chest discomfort shoulder pain and jaw pain.  Underwent cardiac catheterization and received DES x1 to his RCA. Continue aspirin, atorvastatin, chlorthalidone, furosemide, metoprolol, and Effient Heart healthy low-sodium diet-salty 6 given Increase physical activity as tolerated  Essential hypertension-BP today***. Continue chlorthalidone, metoprolol Heart healthy low-sodium diet-salty 6 given Increase physical activity as tolerated   Hyperlipidemia-05/20/2020: Cholesterol 229; HDL 35; LDL Cholesterol 163; Triglycerides 155; VLDL 31 Continue atorvastatin Heart healthy low-sodium high-fiber diet Increase physical activity as tolerated  Disposition: Follow-up with Dr. Jens Som in 3 months.   Thomasene Ripple. Ruqaya Strauss NP-C    05/26/2020, 6:51 PM [facility] [address] Office [phone] Fax  [phone]

## 2020-05-26 NOTE — Progress Notes (Signed)
Primary Physician/Referring:  Vivi Barrack, MD  Patient ID: Michael [name], male    DOB: 05-Nov-1966, 54 y.o.   MRN: [id number]  Chief Complaint  Patient presents with  . Cath (Stent Placement)  . New Patient (Initial Visit)   HPI:    Michael [name]  is a 54 y.o. Caucasian male with hypertension, hyperlipidemia, obesity and obstructive sleep apnea on CPAP, presented to the emergency room on 05/19/2020 with central chest tightness with radiation to the left shoulder and jaw.  He was positive for non-ST elevation myocardial infarction, underwent cardiac catheterization the following day and successful angioplasty to the right coronary artery.  He remained hypertensive throughout the hospitalization.  He now presents to establish care with me.  He is accompanied by his wife, has multiple questions regarding his risk modification and coronary artery disease.  Since cardiac catheterization, angioplasty, he has not had any chest discomfort although still feels slightly tired and fatigued and does have mild dyspnea with exertion activity.  Past Medical History:  Diagnosis Date  . (HFpEF) heart failure with preserved ejection fraction ([place])    a. 05/2020 Echo: EF 60%, no rwma, Gr2 DD. Triv MR.  Marland Kitchen CAD (coronary artery disease)    a. 04/2020 NSTEMI/PCI: LM nl, LAD nl, LCX nl, RCA 12m 85d (3.6x26 Resolute Onyx DES), RPDA nl, RPAV nl, EF 50-55%.  .Marland KitchenHyperlipidemia LDL goal <70 08/28/2018  . [place]oventilation syndrome ([place]   . Sleep apnea   . Sleep apnea with use of continuous positive airway pressure (CPAP) 08/27/2013  . Viral meningitis    Past Surgical History:  Procedure Laterality Date  . CORONARY STENT INTERVENTION N/A 05/20/2020   Procedure: CORONARY STENT INTERVENTION;  Surgeon: ENelva Bush MD;  [facility];  Service: Cardiovascular;  Laterality: N/A;  . LEFT HEART CATH AND CORONARY ANGIOGRAPHY N/A 05/20/2020   Procedure: LEFT HEART CATH AND CORONARY ANGIOGRAPHY;   Surgeon: ENelva Bush MD;  [facility];  Service: Cardiovascular;  Laterality: N/A;   Family History  Problem Relation Age of Onset  . Alzheimer's disease Mother     Social History   Tobacco Use  . Smoking status: Never Smoker  . Smokeless tobacco: Never Used  Substance Use Topics  . Alcohol use: Yes    Comment: rarely   Marital Status: Divorced  ROS  Review of Systems  Constitutional: Positive for malaise/fatigue.  Cardiovascular: Positive for dyspnea on exertion and leg swelling. Negative for chest pain.  Gastrointestinal: Negative for melena.   Objective  Blood pressure (!) 156/97, pulse 67, resp. rate 16, height 5' 8"  (1.727 m), weight (!) 339 lb (153.8 kg), SpO2 96 %.  Vitals with BMI 05/26/2020 05/21/2020 05/21/2020  Height 5' 8"  - -  Weight 339 lbs - -  BMI 567.01- -  Systolic 141013011314 Diastolic 97 98 88  Pulse 67 61 71     Physical Exam Constitutional:      Comments:   He is well-built and morbidly obese in no acute distress.  Cardiovascular:     Rate and Rhythm: Normal rate and regular rhythm.     Pulses: Intact distal pulses.     Heart sounds: Normal heart sounds. No murmur heard.  No gallop.      Comments: No leg edema, no JVD. Pulmonary:     Effort: Pulmonary effort is normal.     Breath sounds: Normal breath sounds.  Abdominal:     General: Bowel  sounds are normal.     Palpations: Abdomen is soft.    Laboratory examination:   Recent Labs    05/19/20 1639 05/21/20 0701  NA 140 138  K 3.8 3.8  CL 106 106  CO2 24 22  GLUCOSE 109* 103*  BUN 14 9  CREATININE 0.93 0.94  CALCIUM 9.5 9.0  GFRNONAA >60 >60  GFRAA >60 >60   estimated creatinine clearance is 131.9 mL/min (by C-G formula based on SCr of 0.94 mg/dL).  CMP Latest Ref Rng & Units 05/21/2020 05/19/2020 04/08/2020  Glucose 70 - 99 mg/dL 103(H) 109(H) -  BUN 6 - 20 mg/dL 9 14 -  Creatinine 0.61 - 1.24 mg/dL 0.94 0.93 -  Sodium 135 - 145 mmol/L 138 140 -  Potassium 3.5  - 5.1 mmol/L 3.8 3.8 -  Chloride 98 - 111 mmol/L 106 106 -  CO2 22 - 32 mmol/L 22 24 -  Calcium 8.9 - 10.3 mg/dL 9.0 9.5 -  Total Protein 6.0 - 8.5 g/dL - 6.6 7.4  Total Bilirubin 0.0 - 1.2 mg/dL - 0.5 0.5  Alkaline Phos 48 - 121 IU/L - 68 71  AST 0 - 40 IU/L - 28 26  ALT 0 - 44 IU/L - 36 31   CBC Latest Ref Rng & Units 05/21/2020 05/19/2020  WBC 4.0 - 10.5 K/uL 8.9 8.1  Hemoglobin 13.0 - 17.0 g/dL 14.5 15.6  Hematocrit 39 - 52 % 44.4 47.5  Platelets 150 - 400 K/uL 175 188    Lipid Panel Lipid Panel Recent Labs    05/20/20 0150  CHOL 229*  TRIG 155*  LDLCALC 163*  VLDL 31  HDL 35*  CHOLHDL 6.5    HEMOGLOBIN A1C Lab Results  Component Value Date   HGBA1C 5.3 05/19/2020   MPG 105.41 05/19/2020   TSH Recent Labs    05/20/20 0159  TSH 4.002   Medications and allergies  No Known Allergies   Current Outpatient Medications  Medication Instructions  . amLODipine-valsartan (EXFORGE) 10-320 MG tablet 1 tablet, Oral, Daily  . aspirin 81 mg, Oral, Daily, Swallow whole.  Marland Kitchen atorvastatin (LIPITOR) 80 mg, Oral, Daily at bedtime  . cephALEXin (KEFLEX) 500 mg, Oral, 3 times daily  . chlorthalidone (HYGROTON) 25 mg, Oral, Daily  . folic acid (FOLVITE) 1 mg, Oral, Daily  . furosemide (LASIX) 20 mg, Oral, Daily PRN  . methotrexate (RHEUMATREX) 20 mg, Oral, Weekly, Tuesday  . metoprolol tartrate (LOPRESSOR) 25 mg, Oral, 2 times daily  . Multiple Vitamin (MULTIVITAMIN) capsule 1 capsule, Oral, Daily  . nitroGLYCERIN (NITROSTAT) 0.4 mg, Sublingual, Every 5 min x3 PRN  . prasugrel (EFFIENT) 10 mg, Oral, Daily  . terbinafine (LAMISIL) 250 mg, Oral, Daily    Radiology:   No results found.  Cardiac Studies:   Coronary angiogram 05/20/2020 1. Severe single-vessel coronary artery disease with sequential 40-50% mid and 80-90% distal RCA stenoses.  The distal RCA lesion appears ulcerated and is likely the culprit lesion for the patient's NSTEMI. 2. No angiographically significant  coronary artery disease involving the left coronary artery. 3. Low normal left ventricular systolic function with basal and mid inferior hypokinesis.  Left ventricular filling pressure is moderately elevated (LVEDP ~30 mmHg). 4. Successful PCI to the distal RCA using Resolute Onyx 3.5 x 26 mm drug-eluting stent (postdilated to 4.1 mm) with 0% residual stenosis and TIMI-3 flow.  Echocardiogram 05/21/2020:  1. Left ventricular ejection fraction, by estimation, is 60%. The left  ventricle has normal function. The left ventricle  has no regional wall  motion abnormalities. There is moderate left ventricular hypertrophy. Left  ventricular diastolic parameters are consistent with Grade II diastolic dysfunction (pseudonormalization).  2. Right ventricular systolic function is normal. The right ventricular  size is normal. Tricuspid regurgitation signal is inadequate for assessing  PA pressure.  3. The mitral valve is grossly normal. Trivial mitral valve  regurgitation. No evidence of mitral stenosis.  4. The aortic valve is tricuspid. Aortic valve regurgitation is not visualized. No aortic stenosis is present.  5. The inferior vena cava is dilated in size with >50% respiratory  variability, suggesting right atrial pressure of 8 mmHg.  EKG  EKG 05/26/2020: Normal sinus rhythm at the rate of 64 bpm, normal axis, anteroseptal infarct old.  Nonspecific T abnormality.  Borderline criteria for LVH.     EKG 05/21/2020: Normal sinus rhythm at rate of 67 bpm, normal axis, TWI, inferior and lateral ischemia.  Assessment     ICD-10-CM   1. Coronary artery disease involving native coronary artery of native heart with unstable angina pectoris (HCC)  I25.110 EKG 12-Lead  2. Primary hypertension  I10 CMP14+EGFR    CBC  3. Hypercholesteremia  E78.00 Lipid Panel With LDL/HDL Ratio  4. Acute on chronic diastolic heart failure (HCC)  I50.33   5. Class 3 severe obesity due to excess calories with serious  comorbidity and body mass index (BMI) of 50.0 to 59.9 in adult (HCC)  E66.01    Z68.43   6. Obstructive sleep apnea on CPAP  G47.33 Ambulatory referral to Sleep Studies   Z99.89      Medications Discontinued During This Encounter  Medication Reason  . hydrOXYzine (ATARAX/VISTARIL) 50 MG tablet No longer needed (for PRN medications)  . losartan (COZAAR) 100 MG tablet Change in therapy  . furosemide (LASIX) 20 MG tablet      Recommendations:   MICA RELEFORD  is a 54 y.o. Caucasian male with hypertension, hyperlipidemia, obesity and obstructive sleep apnea on CPAP, presented to the emergency room on 05/19/2020 with central chest tightness with radiation to the left shoulder and jaw.  He was positive for non-ST elevation myocardial infarction, underwent cardiac catheterization the following day and successful angioplasty to the right coronary artery.  He remained hypertensive throughout the hospitalization.  He now presents to establish care with me.  I have reviewed the results from recent hospitalization office procedures, echocardiogram and angiogram.  I discussed with the patient and his wife regarding lifestyle modification, need for medications and control of hypertension and also hyperlipidemia. I will discontinue Losartan and switch him to Exforge that is amlodipine 10 and valsartan 300 mg for better control of hypertension.  I will also add chlorthalidone 25 mg in the morning.  I will repeat lipids, CBC and CMP in 2 to 3 weeks and I would like to see him back in 6 weeks for follow-up.  With regard to obstructive sleep apnea, he has been on CPAP and compliant however he has not been evaluated in several years, I will refer him to Dr. Roddie Mc.   Adrian Prows, MD, [facility] 05/26/2020, 5:06 PM Office: [phone]

## 2020-05-27 ENCOUNTER — Ambulatory Visit: Payer: BC Managed Care – PPO | Admitting: General Practice

## 2020-05-27 ENCOUNTER — Telehealth (HOSPITAL_COMMUNITY): Payer: Self-pay

## 2020-05-27 NOTE — Telephone Encounter (Signed)
Pt called to schedule for cardiac rehab, pt use to see Dr. Jens Som but he switched to Dr. Jacinto Halim for his cardiology needs. Since he is now seeing Dr. Jacinto Halim, he will need to place a new referral for him. I advised pt of this and he understood. I called Dr. Jacinto Halim office and left a message for Meriam Sprague so they can place a cardiac rehab referral for the pt. Advised pt once we received the new referral from Dr. Jacinto Halim we will call him back to schedule.

## 2020-05-30 NOTE — Telephone Encounter (Signed)
Pt called to check the status of his referral, adv pt we did contact Dr. Jacinto Halim office and still awaiting a new referral to be enter. Also adv pt of time frame we are scheduling right now for CR and that changes day by day. Patient verbalized understanding.

## 2020-06-01 ENCOUNTER — Telehealth: Payer: Self-pay

## 2020-06-01 NOTE — Telephone Encounter (Signed)
I have messaged him on My Chart. Please encourage them to use it

## 2020-06-01 NOTE — Telephone Encounter (Signed)
Patient called, stated that he recently had a heart attack and was diagnosed with hypertension and was prescribed  Amlodipine- Valsartan and Metoprolol and now he has been checking his BP regularly and this morning it was 98/54 and is concerned it may be getting too low. He wants to know if any changes need to be made. Please advise.

## 2020-06-02 ENCOUNTER — Other Ambulatory Visit: Payer: Self-pay | Admitting: Cardiology

## 2020-06-02 DIAGNOSIS — I25110 Atherosclerotic heart disease of native coronary artery with unstable angina pectoris: Secondary | ICD-10-CM

## 2020-06-02 DIAGNOSIS — I252 Old myocardial infarction: Secondary | ICD-10-CM

## 2020-06-02 DIAGNOSIS — I5033 Acute on chronic diastolic (congestive) heart failure: Secondary | ICD-10-CM

## 2020-06-06 ENCOUNTER — Encounter (HOSPITAL_BASED_OUTPATIENT_CLINIC_OR_DEPARTMENT_OTHER): Payer: BC Managed Care – PPO | Attending: Internal Medicine | Admitting: Internal Medicine

## 2020-06-06 DIAGNOSIS — E785 Hyperlipidemia, unspecified: Secondary | ICD-10-CM | POA: Diagnosis not present

## 2020-06-06 DIAGNOSIS — L853 Xerosis cutis: Secondary | ICD-10-CM | POA: Diagnosis not present

## 2020-06-06 DIAGNOSIS — I2510 Atherosclerotic heart disease of native coronary artery without angina pectoris: Secondary | ICD-10-CM | POA: Insufficient documentation

## 2020-06-06 DIAGNOSIS — G4733 Obstructive sleep apnea (adult) (pediatric): Secondary | ICD-10-CM | POA: Diagnosis not present

## 2020-06-06 DIAGNOSIS — L2089 Other atopic dermatitis: Secondary | ICD-10-CM | POA: Insufficient documentation

## 2020-06-06 DIAGNOSIS — J449 Chronic obstructive pulmonary disease, unspecified: Secondary | ICD-10-CM | POA: Insufficient documentation

## 2020-06-06 DIAGNOSIS — I252 Old myocardial infarction: Secondary | ICD-10-CM | POA: Diagnosis not present

## 2020-06-06 DIAGNOSIS — I110 Hypertensive heart disease with heart failure: Secondary | ICD-10-CM | POA: Diagnosis not present

## 2020-06-06 DIAGNOSIS — I5032 Chronic diastolic (congestive) heart failure: Secondary | ICD-10-CM | POA: Diagnosis not present

## 2020-06-08 ENCOUNTER — Telehealth (HOSPITAL_COMMUNITY): Payer: Self-pay

## 2020-06-08 NOTE — Telephone Encounter (Signed)
Pt insurance is active and benefits verified through [employer] Co-pay 0, DED $1,000/$83.87 met, out of pocket $6,000/$472.88 met, co-insurance 30%. no pre-authorization required. Passport, 05/25/2020@9 :58am, REF# [id number]

## 2020-06-09 ENCOUNTER — Encounter: Payer: Self-pay | Admitting: Cardiology

## 2020-06-10 NOTE — Progress Notes (Signed)
[name], [name] ([id number]) Visit Report for 06/06/2020 Abuse/Suicide Risk Screen Details Patient Name: Date of Service: [name], [name] 06/06/2020 2:45 PM Medical Record Number: [id number] Patient Account Number: [id number] Date of Birth/Sex: Treating RN: 1966-10-08 (54 y.o. Male) Yevonne Pax Primary Care Rieley Hausman: Jacquiline Doe Other Clinician: Referring Sonia Stickels: Treating Zyheir Daft/Extender: Loel Dubonnet in Treatment: 0 Abuse/Suicide Risk Screen Items Answer ABUSE RISK SCREEN: Has anyone close to you tried to hurt or harm you recentlyo No Do you feel uncomfortable with anyone in your familyo No Has anyone forced you do things that you didnt want to doo No Electronic Signature(s) Signed: 06/10/2020 5:42:58 PM By: Yevonne Pax RN Entered By: Yevonne Pax on 06/06/2020 15:05:16 -------------------------------------------------------------------------------- Activities of Daily Living Details Patient Name: Date of Service: [name], [name] 06/06/2020 2:45 PM Medical Record Number: [id number] Patient Account Number: [id number] Date of Birth/Sex: Treating RN: Apr 12, 1966 (54 y.o. Male) Yevonne Pax Primary Care Bertran Zeimet: Jacquiline Doe Other Clinician: Referring Harsimran Westman: Treating Kivon Aprea/Extender: Loel Dubonnet in Treatment: 0 Activities of Daily Living Items Answer Activities of Daily Living (Please select one for each item) Drive Automobile Completely Able T Medications ake Completely Able Use T elephone Completely Able Care for Appearance Completely Able Use T oilet Completely Able Bath / Shower Completely Able Dress Self Completely Able Feed Self Completely Able Walk Completely Able Get In / Out Bed Completely Able Housework Completely Able Prepare Meals Completely Able Handle Money Completely Able Shop for Self Completely Able Electronic Signature(s) Signed: 06/10/2020 5:42:58 PM By: Yevonne Pax RN Entered By: Yevonne Pax on 06/06/2020 15:05:39 -------------------------------------------------------------------------------- Education Screening Details Patient Name: Date of Service: [name], [name] 06/06/2020 2:45 PM Medical Record Number: [id number] Patient Account Number: [id number] Date of Birth/Sex: Treating RN: [birth year] (54 y.o. Male) Yevonne Pax Primary Care Jami Bogdanski: Jacquiline Doe Other Clinician: Referring Sonali Wivell: Treating Eladia Frame/Extender: Loel Dubonnet in Treatment: 0 Primary Learner Assessed: Patient Learning Preferences/Education Level/Primary Language Learning Preference: Explanation Highest Education Level: College or Above Preferred Language: English Cognitive Barrier Language Barrier: No Translator Needed: No Memory Deficit: No Emotional Barrier: No Cultural/Religious Beliefs Affecting Medical Care: No Physical Barrier Impaired Vision: Yes Glasses Impaired Hearing: No Decreased Hand dexterity: No Knowledge/Comprehension Knowledge Level: Medium Comprehension Level: High Ability to understand written instructions: High Ability to understand verbal instructions: High Motivation Anxiety Level: Anxious Cooperation: Cooperative Education Importance: Acknowledges Need Interest in Health Problems: Asks Questions Perception: Coherent Willingness to Engage in Self-Management High Activities: Readiness to Engage in Self-Management High Activities: Electronic Signature(s) Signed: 06/10/2020 5:42:58 PM By: Yevonne Pax RN Entered By: Yevonne Pax on 06/06/2020 15:06:54 -------------------------------------------------------------------------------- Fall Risk Assessment Details Patient Name: Date of Service: [name], [name] 06/06/2020 2:45 PM Medical Record Number: [id number] Patient Account Number: [id number] Date of Birth/Sex: Treating RN: 04/12/1966 (54 y.o. Male) Yevonne Pax Primary Care Burgundy Matuszak: Jacquiline Doe Other  Clinician: Referring Esly Selvage: Treating Duaine Radin/Extender: Loel Dubonnet in Treatment: 0 Fall Risk Assessment Items Have you had 2 or more falls in the last 12 monthso 0 No Have you had any fall that resulted in injury in the last 12 monthso 0 No FALLS RISK SCREEN History of falling - immediate or within 3 months 0 No Secondary diagnosis (Do you have 2 or more medical diagnoseso) 0 No Ambulatory aid None/bed rest/wheelchair/nurse 0 No Crutches/cane/walker 0 No Furniture 0 No Intravenous therapy Access/Saline/Heparin Lock 0 No Gait/Transferring Normal/ bed rest/ wheelchair 0 No Weak (short steps with  or without shuffle, stooped but able to lift head while walking, may seek 0 No support from furniture) Impaired (short steps with shuffle, may have difficulty arising from chair, head down, impaired 0 No balance) Mental Status Oriented to own ability 0 No Electronic Signature(s) Signed: 06/10/2020 5:42:58 PM By: Yevonne Pax RN Entered By: Yevonne Pax on 06/06/2020 15:07:03 -------------------------------------------------------------------------------- Foot Assessment Details Patient Name: Date of Service: [name], [name] 06/06/2020 2:45 PM Medical Record Number: [id number] Patient Account Number: [id number] Date of Birth/Sex: Treating RN: [birth year] (54 y.o. Male) Yevonne Pax Primary Care Khushi Zupko: Jacquiline Doe Other Clinician: Referring Decorey Wahlert: Treating Abdalrahman Clementson/Extender: Loel Dubonnet in Treatment: 0 Foot Assessment Items Site Locations + = Sensation present, - = Sensation absent, C = Callus, U = Ulcer R = Redness, W = Warmth, M = Maceration, PU = Pre-ulcerative lesion F = Fissure, S = Swelling, D = Dryness Assessment Right: Left: Other Deformity: No No Prior Foot Ulcer: No No Prior Amputation: No No Charcot Joint: No No Ambulatory Status: Ambulatory Without Help Gait: Steady Electronic Signature(s) Signed:  06/10/2020 5:42:58 PM By: Yevonne Pax RN Entered By: Yevonne Pax on 06/06/2020 15:08:25 -------------------------------------------------------------------------------- Nutrition Risk Screening Details Patient Name: Date of Service: [name], [name] 06/06/2020 2:45 PM Medical Record Number: [id number] Patient Account Number: [id number] Date of Birth/Sex: Treating RN: 1966-08-07 (54 y.o. Male) Yevonne Pax Primary Care Ginna Schuur: Jacquiline Doe Other Clinician: Referring Marranda Arakelian: Treating Mccauley Diehl/Extender: Loel Dubonnet in Treatment: 0 Height (in): 69 Weight (lbs): 330 Body Mass Index (BMI): 48.7 Nutrition Risk Screening Items Score Screening NUTRITION RISK SCREEN: I have an illness or condition that made me change the kind and/or amount of food I eat 0 No I eat fewer than two meals per day 0 No I eat few fruits and vegetables, or milk products 0 No I have three or more drinks of beer, liquor or wine almost every day 0 No I have tooth or mouth problems that make it hard for me to eat 0 No I don't always have enough money to buy the food I need 0 No I eat alone most of the time 0 No I take three or more different prescribed or over-the-counter drugs a day 1 Yes Without wanting to, I have lost or gained 10 pounds in the last six months 0 No I am not always physically able to shop, cook and/or feed myself 0 No Nutrition Protocols Good Risk Protocol 0 No interventions needed Moderate Risk Protocol High Risk Proctocol Risk Level: Good Risk Score: 1 Electronic Signature(s) Signed: 06/10/2020 5:42:58 PM By: Yevonne Pax RN Entered By: Yevonne Pax on 06/06/2020 15:07:11

## 2020-06-10 NOTE — Progress Notes (Signed)
Heringer, Purnell L. ([id number]) Visit Report for 06/06/2020 Chief Complaint Document Details Patient Name: Date of Service: [facility], Hesham L. 06/06/2020 2:45 PM Medical Record Number: [id number] Patient Account Number: [id number] Date of Birth/Sex: Treating RN: [birth year] (54 y.o. Male) Zandra Abts Primary Care Provider: Jacquiline Doe Other Clinician: Referring Provider: Treating Provider/Extender: Loel Dubonnet in Treatment: 0 Information Obtained from: Patient Chief Complaint 06/06/20; patient is here for review of dry cracked skin on his plantar feet. Electronic Signature(s) Signed: 06/07/2020 6:00:01 PM By: Baltazar Najjar MD Entered By: Baltazar Najjar on 06/06/2020 16:20:18 -------------------------------------------------------------------------------- HPI Details Patient Name: Date of Service: [name], Dorn L. 06/06/2020 2:45 PM Medical Record Number: [id number] Patient Account Number: [id number] Date of Birth/Sex: Treating RN: 1966/03/23 (54 y.o. Male) Zandra Abts Primary Care Provider: Jacquiline Doe Other Clinician: Referring Provider: Treating Provider/Extender: Loel Dubonnet in Treatment: 0 History of Present Illness HPI Description: ADMISSION 06/06/20 This is a 54 year old man who follows with Dr. 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 Apparently this was quite severe and the patient was able to show me pictures of his palms which were dry cracked and irritated at one point. He is currently on methotrexate. Apparently the recent increase in methotrexate has resulted in improvement in his palms. He also points out that he has dry cracked skin on his bilateral plantar feet. He says he is spent $1000 on different types of creams to apply to this as well. He says that Dr. Doreen Beam did a culture of a lesion on his feet that grew staph and he is recently completed Keflex. He is also on  oral Lamisil apparently had of the feeling that this may be tinea. He says this is occasionally pruritic and irritating. He wonders if we have any ideas about this. Most of my thoughts are based on the really impressive pictures the patient was also able to show me from late 2020. There there was again inflamed dry eyes cracked skin. There was no obvious blisters although the patient describes these. His feet look a lot better than that now. I was not aware that eczema can present on the feet although there is certainly a hand variant of this. Past medical history includes recent non-ST elevation MI, obstructive sleep apnea, heart failure with preserved ejection fraction, hyperlipidemia, hypertension, COPD, plan weight loss the patient is trying to lose weight Electronic Signature(s) Signed: 06/07/2020 6:00:01 PM By: Baltazar Najjar MD Entered By: Baltazar Najjar on 06/06/2020 16:23:12 -------------------------------------------------------------------------------- Physical Exam Details Patient Name: Date of Service: [name], Zak L. 06/06/2020 2:45 PM Medical Record Number: [id number] Patient Account Number: [id number] Date of Birth/Sex: Treating RN: 1966-09-08 (54 y.o. Male) Zandra Abts Primary Care Provider: Jacquiline Doe Other Clinician: Referring Provider: Treating Provider/Extender: Loel Dubonnet in Treatment: 0 Constitutional Sitting or standing Blood Pressure is within target range for patient.. Pulse regular and within target range for patient.Marland Kitchen Respirations regular, non-labored and within target range.. Temperature is normal and within the target range for the patient.Marland Kitchen Appears in no distress. Cardiovascular Pedal pulses are palpable. Integumentary (Hair, Skin) Dry cracked areas on bilateral plantar feet with some loss of epithelialization. He has mycotic toenails in the bilateral first toes. There is no evidence of a staph infection here that I can  see.. Electronic Signature(s) Signed: 06/07/2020 6:00:01 PM By: Baltazar Najjar MD Entered By: Baltazar Najjar on 06/06/2020 16:24:20 -------------------------------------------------------------------------------- Physician Orders Details Patient Name: Date of Service: [facility]  N, Pamela L. 06/06/2020 2:45 PM Medical Record Number: [id number] Patient Account Number: [id number] Date of Birth/Sex: Treating RN: 05-10-1966 (54 y.o. Male) Zandra AbtsLynch, Shatara Primary Care Provider: Jacquiline DoeParker, Caleb Other Clinician: Referring Provider: Treating Provider/Extender: Loel Dubonnetobson, Ivoree Felmlee Parker, Caleb Weeks in Treatment: 0 Verbal / Phone Orders: No Diagnosis Coding [facility] - Consult only - continue to follow with Dermatology Electronic Signature(s) Signed: 06/06/2020 4:12:59 PM By: Zandra AbtsLynch, Shatara RN, BSN Signed: 06/07/2020 6:00:01 PM By: Baltazar Najjarobson, Aldahir Litaker MD Entered By: Zandra AbtsLynch, Shatara on 06/06/2020 15:36:29 -------------------------------------------------------------------------------- Problem List Details Patient Name: Date of Service: [name], Keylen L. 06/06/2020 2:45 PM Medical Record Number: [id number] Patient Account Number: [id number] Date of Birth/Sex: Treating RN: 05-10-1966 (54 y.o. Male) Zandra AbtsLynch, Shatara Primary Care Provider: Jacquiline DoeParker, Caleb Other Clinician: Referring Provider: Treating Provider/Extender: Loel Dubonnetobson, Caeleb Batalla Parker, Caleb Weeks in Treatment: 0 Active Problems ICD-10 Encounter Code Description Active Date MDM Diagnosis L20.89 Other atopic dermatitis 06/06/2020 No Yes L85.3 Xerosis cutis 06/06/2020 No Yes Inactive Problems Resolved Problems Electronic Signature(s) Signed: 06/07/2020 6:00:01 PM By: Baltazar Najjarobson, Yaneisy Wenz MD Entered By: Baltazar Najjarobson, Jakhai Fant on 06/06/2020 16:14:36 -------------------------------------------------------------------------------- Progress Note Details Patient Name: Date of Service: [name], Samaj L. 06/06/2020 2:45  PM Medical Record Number: [id number] Patient Account Number: [id number] Date of Birth/Sex: Treating RN: 05-10-1966 (54 y.o. Male) Zandra AbtsLynch, Shatara Primary Care Provider: Jacquiline DoeParker, Caleb Other Clinician: Referring Provider: Treating Provider/Extender: Loel Dubonnetobson, Larayah Clute Parker, Caleb Weeks in Treatment: 0 Subjective Chief Complaint Information obtained from Patient 06/06/20; patient is here for review of dry cracked skin on his plantar feet. History of Present Illness (HPI) ADMISSION 06/06/20 This is a 54 year old man who 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 Apparently this was quite severe and the patient was able to show me pictures of his palms which were dry cracked and irritated at one point. He is currently on methotrexate. Apparently the recent increase in methotrexate has resulted in improvement in his palms. He also points out that he has dry cracked skin on his bilateral plantar feet. He says he is spent $1000 on different types of creams to apply to this as well. He says that Dr. Doreen BeamWhitworth did a culture of a lesion on his feet that grew staph and he is recently completed Keflex. He is also on oral Lamisil apparently had of the feeling that this may be tinea. He says this is occasionally pruritic and irritating. He wonders if we have any ideas about this. Most of my thoughts are based on the really impressive pictures the patient was also able to show me from late 2020. There there was again inflamed dry eyes cracked skin. There was no obvious blisters although the patient describes these. His feet look a lot better than that now. I was not aware that eczema can present on the feet although there is certainly a hand variant of this. Past medical history includes recent non-ST elevation MI, obstructive sleep apnea, heart failure with preserved ejection fraction, hyperlipidemia, hypertension, COPD, plan weight loss the patient is  trying to lose weight Patient History Information obtained from Patient. Allergies No Known Allergies Family History Lung Disease - Maternal Grandparents, No family history of Cancer, Diabetes, Heart Disease, Hereditary Spherocytosis, Hypertension, Kidney Disease, Seizures, Stroke, Thyroid Problems, Tuberculosis. Social History Never smoker, Marital Status - Divorced, Alcohol Use - Rarely, Drug Use - No History, Caffeine Use - Daily. Medical History Eyes Denies history of Cataracts, Glaucoma, Optic Neuritis Ear/Nose/Mouth/Throat Denies history  of Chronic sinus problems/congestion, Middle ear problems Hematologic/Lymphatic Denies history of Anemia, Hemophilia, Human Immunodeficiency Virus, Lymphedema, Sickle Cell Disease Respiratory Patient has history of Chronic Obstructive Pulmonary Disease (COPD) Denies history of Aspiration, Asthma, Pneumothorax, Sleep Apnea, Tuberculosis Cardiovascular Patient has history of Congestive Heart Failure, Coronary Artery Disease, Hypertension, Myocardial Infarction Denies history of Angina, Arrhythmia, Deep Vein Thrombosis, Hypotension, Peripheral Arterial Disease, Peripheral Venous Disease, Phlebitis, Vasculitis Gastrointestinal Denies history of Cirrhosis , Colitis, Crohnoos, Hepatitis A, Hepatitis B, Hepatitis C Endocrine Denies history of Type II Diabetes Genitourinary Denies history of End Stage Renal Disease Immunological Denies history of Lupus Erythematosus, Raynaudoos, Scleroderma Integumentary (Skin) Denies history of History of Burn Musculoskeletal Denies history of Gout, Rheumatoid Arthritis, Osteoarthritis, Osteomyelitis Neurologic Denies history of Dementia, Neuropathy, Quadriplegia, Paraplegia, Seizure Disorder Oncologic Denies history of Received Chemotherapy, Received Radiation Psychiatric Denies history of Anorexia/bulimia, Confinement Anxiety Review of Systems (ROS) Constitutional Symptoms (General Health) Denies  complaints or symptoms of Fatigue, Fever, Chills, Marked Weight Change. Eyes Denies complaints or symptoms of Dry Eyes, Vision Changes, Glasses / Contacts. Ear/Nose/Mouth/Throat Denies complaints or symptoms of Chronic sinus problems or rhinitis. Respiratory Denies complaints or symptoms of Chronic or frequent coughs, Shortness of Breath. Cardiovascular Denies complaints or symptoms of Chest pain. Gastrointestinal Denies complaints or symptoms of Frequent diarrhea, Nausea, Vomiting. Endocrine Denies complaints or symptoms of Heat/cold intolerance. Genitourinary Denies complaints or symptoms of Frequent urination. Integumentary (Skin) Complains or has symptoms of Wounds. Musculoskeletal Denies complaints or symptoms of Muscle Pain, Muscle Weakness. Neurologic Denies complaints or symptoms of Numbness/parasthesias. Psychiatric Denies complaints or symptoms of Claustrophobia, Suicidal. Objective Constitutional Sitting or standing Blood Pressure is within target range for patient.. Pulse regular and within target range for patient.Marland Kitchen Respirations regular, non-labored and within target range.. Temperature is normal and within the target range for the patient.Marland Kitchen Appears in no distress. Vitals Time Taken: 2:54 PM, Height: 69 in, Source: Stated, Weight: 330 lbs, Source: Stated, BMI: 48.7, Temperature: 98.2 F, Pulse: 73 bpm, Respiratory Rate: 18 breaths/min, Blood Pressure: 125/80 mmHg. Cardiovascular Pedal pulses are palpable. Integumentary (Hair, Skin) Dry cracked areas on bilateral plantar feet with some loss of epithelialization. He has mycotic toenails in the bilateral first toes. There is no evidence of a staph infection here that I can see.. Wound #1 status is Open. Original cause of wound was Blister. The wound is located on the Right Metatarsal head first. The wound measures 4.5cm length x 2.5cm width x 0.1cm depth; 8.836cm^2 area and 0.884cm^3 volume. There is Fat Layer  (Subcutaneous Tissue) Exposed exposed. There is no tunneling or undermining noted. There is a small amount of serosanguineous drainage noted. The wound margin is flat and intact. There is large (67-100%) red granulation within the wound bed. There is no necrotic tissue within the wound bed. Assessment Active Problems ICD-10 Other atopic dermatitis Xerosis cutis Plan Discharge From [facility] Services: Discharge from Wound Care Center - Consult only - continue to follow with Dermatology 1. I told the patient I thought he had Xerotic type skin on his bilateral lower extremities although the pictures he showed me from 2020 looked a lot more dry scaly and inflamed. I had some thought about whether he could have palmar plantar psoriasis although he tells me that Dr. Johna Roles thinks that this is dyshidrotic eczema. Indeed the current treatment with methotrexate seems to have helped his hands and I suppose you could make an argument that is also helped his feet. He has also had a recent course of Keflex I see  no evidence of infection here and I am not certain that this looks like a tinea infection either. 2. I told the patient also that if he had presented to me I would have sent him to dermatology. For now I simply suggested skin lubrication. I see no evidence of infection or inflammation in the soles of his feet Electronic Signature(s) Signed: 06/07/2020 6:00:01 PM By: Baltazar Najjar MD Entered By: Baltazar Najjar on 06/06/2020 16:27:59 -------------------------------------------------------------------------------- HxROS Details Patient Name: Date of Service: [name], Azeem L. 06/06/2020 2:45 PM Medical Record Number: [id number] Patient Account Number: [id number] Date of Birth/Sex: Treating RN: 05/27/1966 (54 y.o. Male) Yevonne Pax Primary Care Provider: Jacquiline Doe Other Clinician: Referring Provider: Treating Provider/Extender: Loel Dubonnet in Treatment: 0 Information  Obtained From Patient Constitutional Symptoms (General Health) Complaints and Symptoms: Negative for: Fatigue; Fever; Chills; Marked Weight Change Eyes Complaints and Symptoms: Negative for: Dry Eyes; Vision Changes; Glasses / Contacts Medical History: Negative for: Cataracts; Glaucoma; Optic Neuritis Ear/Nose/Mouth/Throat Complaints and Symptoms: Negative for: Chronic sinus problems or rhinitis Medical History: Negative for: Chronic sinus problems/congestion; Middle ear problems Respiratory Complaints and Symptoms: Negative for: Chronic or frequent coughs; Shortness of Breath Medical History: Positive for: Chronic Obstructive Pulmonary Disease (COPD) Negative for: Aspiration; Asthma; Pneumothorax; Sleep Apnea; Tuberculosis Cardiovascular Complaints and Symptoms: Negative for: Chest pain Medical History: Positive for: Congestive Heart Failure; Coronary Artery Disease; Hypertension; Myocardial Infarction Negative for: Angina; Arrhythmia; Deep Vein Thrombosis; Hypotension; Peripheral Arterial Disease; Peripheral Venous Disease; Phlebitis; Vasculitis Gastrointestinal Complaints and Symptoms: Negative for: Frequent diarrhea; Nausea; Vomiting Medical History: Negative for: Cirrhosis ; Colitis; Crohns; Hepatitis A; Hepatitis B; Hepatitis C Endocrine Complaints and Symptoms: Negative for: Heat/cold intolerance Medical History: Negative for: Type II Diabetes Genitourinary Complaints and Symptoms: Negative for: Frequent urination Medical History: Negative for: End Stage Renal Disease Integumentary (Skin) Complaints and Symptoms: Positive for: Wounds Medical History: Negative for: History of Burn Musculoskeletal Complaints and Symptoms: Negative for: Muscle Pain; Muscle Weakness Medical History: Negative for: Gout; Rheumatoid Arthritis; Osteoarthritis; Osteomyelitis Neurologic Complaints and Symptoms: Negative for: Numbness/parasthesias Medical History: Negative for:  Dementia; Neuropathy; Quadriplegia; Paraplegia; Seizure Disorder Psychiatric Complaints and Symptoms: Negative for: Claustrophobia; Suicidal Medical History: Negative for: Anorexia/bulimia; Confinement Anxiety Hematologic/Lymphatic Medical History: Negative for: Anemia; Hemophilia; Human Immunodeficiency Virus; Lymphedema; Sickle Cell Disease Immunological Medical History: Negative for: Lupus Erythematosus; Raynauds; Scleroderma Oncologic Medical History: Negative for: Received Chemotherapy; Received Radiation Immunizations Pneumococcal Vaccine: Received Pneumococcal Vaccination: No Implantable Devices None Family and Social History Cancer: No; Diabetes: No; Heart Disease: No; Hereditary Spherocytosis: No; Hypertension: No; Kidney Disease: No; Lung Disease: Yes - Maternal Grandparents; Seizures: No; Stroke: No; Thyroid Problems: No; Tuberculosis: No; Never smoker; Marital Status - Divorced; Alcohol Use: Rarely; Drug Use: No History; Caffeine Use: Daily; Financial Concerns: No; Food, Clothing or Shelter Needs: No; Support System Lacking: No; Transportation Concerns: No Psychologist, prison and probation services) Signed: 06/07/2020 6:00:01 PM By: Baltazar Najjar MD Signed: 06/10/2020 5:42:58 PM By: Yevonne Pax RN Entered By: Yevonne Pax on 06/06/2020 15:04:58 -------------------------------------------------------------------------------- SuperBill Details Patient Name: Date of Service: [name], Arius L. 06/06/2020 Medical Record Number: [id number] Patient Account Number: [id number] Date of Birth/Sex: Treating RN: 1966-03-27 (54 y.o. Male) Zandra Abts Primary Care Provider: Jacquiline Doe Other Clinician: Referring Provider: Treating Provider/Extender: Loel Dubonnet in Treatment: 0 Diagnosis Coding ICD-10 Codes Code Description L20.89 Other atopic dermatitis L85.3 Xerosis cutis Facility Procedures CPT4 Code: [id number] Description: 99214 - WOUND CARE VISIT-LEV 4 EST  PT Modifier: Quantity: 1 Physician Procedures :  CPT4 Code Description Modifier [id number] - WC PHYS LEVEL 2 - NEW PT ICD-10 Diagnosis Description L20.89 Other atopic dermatitis L85.3 Xerosis cutis Quantity: 1 Electronic Signature(s) Signed: 06/07/2020 6:00:01 PM By: Baltazar Najjar MD Previous Signature: 06/06/2020 4:12:59 PM Version By: Zandra Abts RN, BSN Entered By: Baltazar Najjar on 06/06/2020 47:82:95

## 2020-06-10 NOTE — Progress Notes (Signed)
Ehmann, Lyriq L. ([id number]) Visit Report for 06/06/2020 Allergy List Details Patient Name: Date of Service: [name], [name] 06/06/2020 2:45 PM Medical Record Number: [id number] Patient Account Number: [id number] Date of Birth/Sex: Treating RN: [birth year] (54 y.o. Male) Michael [name] Primary Care Michael [name]: Michael [name] Other Clinician: Referring Michael [name]: Treating Michael [name]/Extender: Michael [name], Michael [name] in Treatment: 0 Allergies Active Allergies No Known Allergies Allergy Notes Electronic Signature(s) Signed: 06/10/2020 5:42:58 PM By: Michael Pax RN Entered By: Michael [name] on 06/06/2020 14:59:01 -------------------------------------------------------------------------------- Arrival Information Details Patient Name: Date of Service: [name], [name] 06/06/2020 2:45 PM Medical Record Number: [id number] Patient Account Number: [id number] Date of Birth/Sex: Treating RN: June 03, 1966 (54 y.o. Male) Michael [name] Primary Care Amore Ackman: Michael [name] Other Clinician: Referring Tyleigh Mahn: Treating Elana Jian/Extender: Loel Dubonnet in Treatment: 0 Visit Information Patient Arrived: Ambulatory Arrival Time: 14:45 Accompanied By: self Transfer Assistance: None Patient Identification Verified: Yes Secondary Verification Process Completed: Yes Patient Requires Transmission-Based Precautions: No Patient Has Alerts: No Electronic Signature(s) Signed: 06/10/2020 5:42:58 PM By: Michael Pax RN Entered By: Michael [name] on 06/06/2020 14:54:50 -------------------------------------------------------------------------------- Clinic Level of Care Assessment Details Patient Name: Date of Service: [facility] HO [name], [name] 06/06/2020 2:45 PM Medical Record Number: [id number] Patient Account Number: [id number] Date of Birth/Sex: Treating RN: 1966/03/28 (54 y.o. Male) Michael [name] Primary Care Michael [name]: Michael [name] Other Clinician: Referring Kristan Brummitt: Treating  Michael [name]/Extender: Loel Dubonnet in Treatment: 0 Clinic Level of Care Assessment Items TOOL 2 Quantity Score X- 1 0 Use when only an EandM is performed on the INITIAL visit ASSESSMENTS - Nursing Assessment / Reassessment X- 1 20 General Physical Exam (combine w/ comprehensive assessment (listed just below) when performed on new pt. evals) X- 1 25 Comprehensive Assessment (HX, ROS, Risk Assessments, Wounds Hx, etc.) ASSESSMENTS - Wound and Skin A ssessment / Reassessment X - Simple Wound Assessment / Reassessment - one wound 1 5 []  - 0 Complex Wound Assessment / Reassessment - multiple wounds []  - 0 Dermatologic / Skin Assessment (not related to wound area) ASSESSMENTS - Ostomy and/or Continence Assessment and Care []  - 0 Incontinence Assessment and Management []  - 0 Ostomy Care Assessment and Management (repouching, etc.) PROCESS - Coordination of Care X - Simple Patient / Family Education for ongoing care 1 15 []  - 0 Complex (extensive) Patient / Family Education for ongoing care X- 1 10 Staff obtains , Records, T Results / Process Orders est []  - 0 Staff telephones HHA, Nursing Homes / Clarify orders / etc []  - 0 Routine Transfer to another Facility (non-emergent condition) []  - 0 Routine Hospital Admission (non-emergent condition) X- 1 15 New Admissions / / Ordering NPWT Apligraf, etc. , []  - 0 Emergency Hospital Admission (emergent condition) X- 1 10 Simple Discharge Coordination []  - 0 Complex (extensive) Discharge Coordination PROCESS - Special Needs []  - 0 Pediatric / Minor Patient Management []  - 0 Isolation Patient Management []  - 0 Hearing / Language / Visual special needs []  - 0 Assessment of Community assistance (transportation, D/C planning, etc.) []  - 0 Additional assistance / Altered mentation []  - 0 Support Surface(s) Assessment (bed, cushion, seat, etc.) INTERVENTIONS - Wound Cleansing /  Measurement X- 1 5 Wound Imaging (photographs - any number of wounds) []  - 0 Wound Tracing (instead of photographs) X- 1 5 Simple Wound Measurement - one wound []  - 0 Complex Wound Measurement - multiple wounds X- 1 5 Simple Wound Cleansing - one wound []  -  0 Complex Wound Cleansing - multiple wounds INTERVENTIONS - Wound Dressings []  - 0 Small Wound Dressing one or multiple wounds []  - 0 Medium Wound Dressing one or multiple wounds []  - 0 Large Wound Dressing one or multiple wounds []  - 0 Application of Medications - injection INTERVENTIONS - Miscellaneous []  - 0 External ear exam []  - 0 Specimen Collection (cultures, biopsies, blood, body fluids, etc.) []  - 0 Specimen(s) / Culture(s) sent or taken to Lab for analysis []  - 0 Patient Transfer (multiple staff / Lift / Similar devices) []  - 0 Simple Staple / Suture removal (25 or less) []  - 0 Complex Staple / Suture removal (26 or more) []  - 0 Hypo / Hyperglycemic Management (close monitor of Blood Glucose) X- 1 15 Ankle / Brachial Index (ABI) - do not check if billed separately Has the patient been seen at the hospital within the last three years: Yes Total Score: 130 Level Of Care: New/Established - Level 4 Electronic Signature(s) Signed: 06/06/2020 4:12:59 PM By: RN, BSN Entered By: on 06/06/2020 15:37:15 -------------------------------------------------------------------------------- Lower Extremity Assessment Details Patient Name: Date of Service: [name], [name] 06/06/2020 2:45 PM Medical Record Number: Patient Account Number: Date of Birth/Sex: Treating RN: 03-21-1966 (54 y.o. Male) Primary Care Maiko Salais: Other Clinician: Referring Milas Schappell: Treating Reilyn Nelson/Extender: 06/08/2020 in Treatment: 0 Edema Assessment Assessed: [Left: No] [Right: No] [Left: Edema] [Right: :] Calf Left: Right: Point  of Measurement: 42 cm From Medial Instep cm 45 cm Ankle Left: Right: Point of Measurement: 10 cm From Medial Instep cm 26 cm Vascular Assessment Blood Pressure: Brachial: [Right:125] Ankle: [Right:Dorsalis Pedis: 130 1.04] Electronic Signature(s) Signed: 06/10/2020 5:42:58 PM By: Michael Abts RN Entered By: 06/08/2020 on 06/06/2020 15:15:01 -------------------------------------------------------------------------------- Multi Wound Chart Details Patient Name: Date of Service: [name], [name] 06/06/2020 2:45 PM Medical Record Number: [id number] Patient Account Number: 02/06/1966 Date of Birth/Sex: Treating RN: 1966-09-21 (54 y.o. Male) Michael [name] Primary Care Charlisa Cham: Loel Dubonnet Other Clinician: Referring Reniya Mcclees: Treating Lakendria Nicastro/Extender: 06/12/2020 in Treatment: 0 Vital Signs Height(in): 69 Pulse(bpm): 73 Weight(lbs): 330 Blood Pressure(mmHg): 125/80 Body Mass Index(BMI): 49 Temperature(F): 98.2 Respiratory Rate(breaths/min): 18 Photos: [1:No Photos Right Metatarsal head first] [[name]/A:[name]/A [name]/A] Wound Location: [1:Blister] [[name]/A:[name]/A] Wounding Event: [1:Fungal] [[name]/A:[name]/A] Primary Etiology: [1:Chronic Obstructive Pulmonary] [[name]/A:[name]/A] Comorbid History: [1:Disease (COPD), Congestive Heart Failure, Coronary Artery Disease, Hypertension, Myocardial Infarction 03/19/2020] [[name]/A:[name]/A] Date Acquired: [1:0] [[name]/A:[name]/A] [name] of Treatment: [1:Open] [[name]/A:[name]/A] Wound Status: [1:Yes] [[name]/A:[name]/A] Clustered Wound: [1:4.5x2.5x0.1] [[name]/A:[name]/A] Measurements L x W x D (cm) [1:8.836] [[name]/A:[name]/A] A (cm) : rea [1:0.884] [[name]/A:[name]/A] Volume (cm) : [1:Full Thickness Without Exposed] [[name]/A:[name]/A] Classification: [1:Support Structures Small] [[name]/A:[name]/A] Exudate Amount: [1:Serosanguineous] [[name]/A:[name]/A] Exudate Type: [1:red, brown] [[name]/A:[name]/A] Exudate Color: [1:Flat and Intact] [[name]/A:[name]/A] Wound Margin: [1:Large (67-100%)] [[name]/A:[name]/A] Granulation Amount: [1:Red] [[name]/A:[name]/A] Granulation  Quality: [1:None Present (0%)] [[name]/A:[name]/A] Necrotic Amount: [1:Fat Layer (Subcutaneous Tissue)] [[name]/A:[name]/A] Exposed Structures: [1:Exposed: Yes Fascia: No Tendon: No Muscle: No Joint: No Bone: No None] [[name]/A:[name]/A] Treatment Notes Electronic Signature(s) Signed: 06/07/2020 6:00:01 PM By: 06/08/2020 MD Signed: 06/09/2020 5:02:14 PM By: [id number] RN, BSN Entered By: [id number] on 06/06/2020 16:14:46 -------------------------------------------------------------------------------- Pain Assessment Details Patient Name: Date of Service: [name], [name] 06/06/2020 2:45 PM Medical Record Number: Michael [name] Patient Account Number: Michael [name] Date of Birth/Sex: Treating RN: September 28, 1966 (54 y.o. Male) 06/09/2020 Primary Care Maher Shon: Baltazar Najjar Other Clinician: Referring Gayl Ivanoff: Treating Annalynn Centanni/Extender: 06/11/2020  Michael [name] [name] in Treatment: 0 Active Problems Location of Pain Severity and Description of Pain Patient Has Paino No Site Locations Pain Management and Medication Current Pain Management: Electronic Signature(s) Signed: 06/10/2020 5:42:58 PM By: Michael Pax RN Entered By: Michael [name] on 06/06/2020 15:16:58 -------------------------------------------------------------------------------- Patient/Caregiver Education Details Patient Name: Date of Service: [name], [name] 7/19/2021andnbsp2:45 PM Medical Record Number: [id number] Patient Account Number: [id number] Date of Birth/Gender: Treating RN: Feb 15, 1966 (54 y.o. Male) Michael [name] Primary Care Physician: Michael [name] Other Clinician: Referring Physician: Treating Physician/Extender: Loel Dubonnet in Treatment: 0 Education Assessment Education Provided To: Patient Education Topics Provided Wound/Skin Impairment: Methods: Explain/Verbal Responses: State content correctly Electronic Signature(s) Signed: 06/06/2020 4:12:59 PM By: Michael Abts RN, BSN Entered  By: Michael [name] on 06/06/2020 15:36:39 -------------------------------------------------------------------------------- Wound Assessment Details Patient Name: Date of Service: [name], [name] 06/06/2020 2:45 PM Medical Record Number: [id number] Patient Account Number: [id number] Date of Birth/Sex: Treating RN: 1966-06-21 (54 y.o. Male) Michael [name] Primary Care Elbert Spickler: Michael [name] Other Clinician: Referring Nyala Kirchner: Treating Matti Killingsworth/Extender: Loel Dubonnet in Treatment: 0 Wound Status Wound Number: 1 Primary Fungal Etiology: Wound Location: Right Metatarsal head first Wound Open Wounding Event: Blister Status: Date Acquired: 03/19/2020 Comorbid Chronic Obstructive Pulmonary Disease (COPD), Congestive Heart [name] Of Treatment: 0 History: Failure, Coronary Artery Disease, Hypertension, Myocardial Clustered Wound: Yes Infarction Wound Measurements Length: (cm) 4.5 Width: (cm) 2.5 Depth: (cm) 0.1 Area: (cm) 8.836 Volume: (cm) 0.884 % Reduction in Area: % Reduction in Volume: Epithelialization: None Tunneling: No Undermining: No Wound Description Classification: Full Thickness Without Exposed Support Structures Wound Margin: Flat and Intact Exudate Amount: Small Exudate Type: Serosanguineous Exudate Color: red, brown Foul Odor After Cleansing: No Slough/Fibrino No Wound Bed Granulation Amount: Large (67-100%) Exposed Structure Granulation Quality: Red Fascia Exposed: No Necrotic Amount: None Present (0%) Fat Layer (Subcutaneous Tissue) Exposed: Yes Tendon Exposed: No Muscle Exposed: No Joint Exposed: No Bone Exposed: No Electronic Signature(s) Signed: 06/10/2020 5:42:58 PM By: Michael Pax RN Entered By: Michael [name] on 06/06/2020 15:16:45 -------------------------------------------------------------------------------- Vitals Details Patient Name: Date of Service: [name], [name] 06/06/2020 2:45 PM Medical Record Number:  [id number] Patient Account Number: [id number] Date of Birth/Sex: Treating RN: 1966/11/04 (54 y.o. Male) Michael [name] Primary Care Jaquelyn Sakamoto: Michael [name] Other Clinician: Referring Jerre Diguglielmo: Treating Jamarkis Branam/Extender: Loel Dubonnet in Treatment: 0 Vital Signs Time Taken: 14:54 Temperature (F): 98.2 Height (in): 69 Pulse (bpm): 73 Source: Stated Respiratory Rate (breaths/min): 18 Weight (lbs): 330 Blood Pressure (mmHg): 125/80 Source: Stated Reference Range: 80 - 120 mg / dl Body Mass Index (BMI): 48.7 Electronic Signature(s) Signed: 06/10/2020 5:42:58 PM By: Michael Pax RN Entered By: Michael [name] on 06/06/2020 14:58:51

## 2020-06-15 ENCOUNTER — Encounter (HOSPITAL_BASED_OUTPATIENT_CLINIC_OR_DEPARTMENT_OTHER): Payer: BC Managed Care – PPO | Admitting: Physician Assistant

## 2020-06-16 ENCOUNTER — Telehealth (HOSPITAL_COMMUNITY): Payer: Self-pay | Admitting: Student-PharmD

## 2020-06-27 ENCOUNTER — Telehealth (HOSPITAL_COMMUNITY): Payer: Self-pay

## 2020-06-27 ENCOUNTER — Other Ambulatory Visit: Payer: Self-pay

## 2020-06-27 ENCOUNTER — Encounter (HOSPITAL_COMMUNITY)
Admission: RE | Admit: 2020-06-27 | Discharge: 2020-06-27 | Disposition: A | Discharge status: Home / Self Care | Payer: BC Managed Care – PPO | Source: Ambulatory Visit | Attending: Cardiology | Admitting: Cardiology

## 2020-06-27 DIAGNOSIS — Z955 Presence of coronary angioplasty implant and graft: Secondary | ICD-10-CM | POA: Insufficient documentation

## 2020-06-27 DIAGNOSIS — I214 Non-ST elevation (NSTEMI) myocardial infarction: Secondary | ICD-10-CM | POA: Insufficient documentation

## 2020-06-27 NOTE — Telephone Encounter (Signed)
Cardiac Rehab Telephone Note:  Successful telephone encounter to Michael [name] to confirm Cardiac Rehab orientation appointment for 06/30/20 at 9:00. Unfortunately Michael [name] states today is his first day back to work and can't talk at this time. Request call back today after 3:00 pm. Will attempt to follow up with patient at that time.    Emryn Flanery E. Suzie Portela RN, BSN [place] [name]. [facility]  Cardiac and Pulmonary Rehabilitation Phone: [phone] Fax: [phone]

## 2020-06-27 NOTE — Progress Notes (Signed)
Cardiac Rehab Telephone Note:  Successful telephone encounter to [name] to confirm Cardiac Rehab orientation appointment for 06/30/20 at 9:00. Nursing assessment completed. Patient questions answered. Instructions for appointment provided. Patient screening for Covid-19 negative.  Chalsey Leeth E. Suzie Portela RN, BSN [place]. [facility]  Cardiac and Pulmonary Rehabilitation Phone: [phone] Fax: [phone]

## 2020-06-27 NOTE — Telephone Encounter (Signed)
Cardiac Rehab - Pharmacy Resident Documentation   Patient unable to be reached after three call attempts. Please complete allergy verification and medication review during patient's cardiac rehab appointment.    Pervis Hocking, PharmD PGY1 Pharmacy Resident 06/27/2020 11:06 AM  Please check [email or web address] for unit-specific pharmacy phone numbers.

## 2020-06-30 ENCOUNTER — Other Ambulatory Visit: Payer: Self-pay

## 2020-06-30 ENCOUNTER — Ambulatory Visit: Payer: BC Managed Care – PPO | Admitting: Neurology

## 2020-06-30 ENCOUNTER — Encounter (HOSPITAL_COMMUNITY)
Admission: RE | Admit: 2020-06-30 | Discharge: 2020-06-30 | Disposition: A | Discharge status: Home / Self Care | Payer: BC Managed Care – PPO | Source: Ambulatory Visit | Attending: Cardiology | Admitting: Cardiology

## 2020-06-30 ENCOUNTER — Encounter (HOSPITAL_COMMUNITY): Payer: Self-pay

## 2020-06-30 ENCOUNTER — Encounter: Payer: Self-pay | Admitting: Neurology

## 2020-06-30 VITALS — BP 104/68 | HR 66 | Ht 68.75 in | Wt 329.8 lb

## 2020-06-30 VITALS — BP 115/66 | HR 62 | Ht 69.0 in | Wt 330.0 lb

## 2020-06-30 DIAGNOSIS — G4730 Sleep apnea, unspecified: Secondary | ICD-10-CM | POA: Diagnosis not present

## 2020-06-30 DIAGNOSIS — I5030 Unspecified diastolic (congestive) heart failure: Secondary | ICD-10-CM | POA: Diagnosis not present

## 2020-06-30 DIAGNOSIS — E662 Morbid (severe) obesity with alveolar hypoventilation: Secondary | ICD-10-CM | POA: Diagnosis not present

## 2020-06-30 DIAGNOSIS — I214 Non-ST elevation (NSTEMI) myocardial infarction: Secondary | ICD-10-CM

## 2020-06-30 DIAGNOSIS — I2510 Atherosclerotic heart disease of native coronary artery without angina pectoris: Secondary | ICD-10-CM | POA: Diagnosis not present

## 2020-06-30 DIAGNOSIS — G4731 Primary central sleep apnea: Secondary | ICD-10-CM

## 2020-06-30 DIAGNOSIS — Z9989 Dependence on other enabling machines and devices: Secondary | ICD-10-CM

## 2020-06-30 DIAGNOSIS — Z955 Presence of coronary angioplasty implant and graft: Secondary | ICD-10-CM

## 2020-06-30 NOTE — Patient Instructions (Addendum)
Obesity Hypoventilation Syndrome  Obesity hypoventilation syndrome (OHS) means that you are not breathing well enough to get air in and out of your lungs efficiently (ventilation). This causes a low oxygen level and a high carbon dioxide level in your blood (hypoventilation). Having too much total body fat (obesity) is a significant risk factor for developing OHS. OHS makes it harder for your heart to pump oxygen-rich blood to your body. It can cause sleep disturbances and make you feel sleepy during the day. Over time, OHS can increase your risk for:  Heart disease.  High blood pressure (hypertension).  Reduced ability to absorb sugar from the bloodstream (insulin resistance).  Heart failure. Over time, OHS weakens your heart and can lead to heart failure. What are the causes? The exact cause of OHS is not known. Possible causes include:  Pressure on the lungs from excess body weight.  Obesity-related changes in how much air the lungs can hold (lung capacity) and how much they can expand (lung compliance).  Failure of the brain to regulate oxygen and carbon dioxide levels properly.  Chemicals (hormones) produced by excess fat cells interfering with breathing regulation.  A breathing condition in which breathing pauses or becomes shallow during sleep (sleep apnea). This condition can eventually cause the body to ventilate poorly and to hold onto carbon dioxide during the day. What increases the risk? You may have a greater risk for OHS if you:  Have a BMI of 30 or higher. BMI is an estimate of body fat that is calculated from height and weight. For adults, a BMI of 30 or higher is considered obese.  Are 40?53 years old.  Carry most of your excess weight around your waist.  Experience moderate symptoms of sleep apnea. What are the signs or symptoms? The most common symptoms of OHS are:  Daytime sleepiness.  Lack of energy.  Shortness of breath.  Morning headaches.  Sleep  apnea.  Trouble concentrating.  Irritability, mood swings, or depression.  Swollen veins in the neck.  Swelling of the legs. How is this diagnosed? Your health care provider may suspect OHS if you are obese and have poor breathing during the day and at night. Your health care provider will also do a physical exam. You may have tests to:  Measure your BMI.  Measure your blood oxygen level with a sensor placed on your finger (pulse oximetry).  Measure blood oxygen and carbon dioxide in a blood sample.  Measure the amount of red blood cells in a blood sample. OHS causes the number of red blood cells you have to increase (polycythemia).  Check your breathing ability (pulmonary function testing).  Check your breathing ability, breathing patterns, and oxygen level while you sleep (sleep study). You may also have a chest X-ray to rule out other breathing problems. You may have an electrocardiogram (ECG) and or echocardiogram to check for signs of heart failure. How is this treated? Weight loss is the most important part of treatment for OHS, and it may be the only treatment that you need. Other treatments may include:  Using a device to open your airway while you sleep, such as a continuous positive airway pressure (CPAP) machine that delivers oxygen to your airway through a mask.  Surgery (gastric bypass surgery) to lower your BMI. This may be needed if: ? You are very obese. ? Other treatments have not worked for you. ? Your OHS is very severe and is causing organ damage, such as heart failure. Follow these   instructions at home:  Medicines  Take over-the-counter and prescription medicines only as told by your health care provider.  Ask your health care provider what medicines are safe for you. You may be told to avoid medicines that can impair breathing and make OHS worse, such as sedatives and narcotics. Sleeping habits  If you are prescribed a CPAP machine, make sure you  understand and use the machine as directed.  Try to get 8 hours of sleep every night.  Go to bed at the same time every night, and get up at the same time every day. General instructions  Work with your health care provider to make a diet and exercise plan that helps you reach and maintain a healthy weight.  Eat a healthy diet.  Avoid smoking.  Exercise regularly as told by your health care provider.  During the evening, do not drink caffeine and do not eat heavy meals.  Keep all follow-up visits as told by your health care provider. This is important. Contact a health care provider if:  You experience new or worsening shortness of breath.  You have chest pain.  You have an irregular heartbeat (palpitations).  You have dizziness.  You faint.  You develop a cough.  You have a fever.  You have chest pain when you breathe (pleurisy). This information is not intended to replace advice given to you by your health care provider. Make sure you discuss any questions you have with your health care provider. Document Revised: 02/27/2019 Document Reviewed: 04/16/2016 Elsevier Patient Education  2020 [employer]  

## 2020-06-30 NOTE — Progress Notes (Signed)
SLEEP MEDICINE CLINIC    Provider:  Melvyn Novas, MD  Primary Care Physician:  Ardith Dark, MD [address] [place] [address]     Referring Provider: Dr Yates Decamp, MD         Chief Complaint according to patient   Patient presents with:    . New Patient (Initial Visit)     pt has not been seen here since 2015. He suffered a Myocardial infarction 5 weeks ago- Dr Jacinto Halim. he states that CPAP machine in the last month he is having dry mouth. he has been purchasing supplies from [facility]. last SS was 03/2013      HISTORY OF PRESENT ILLNESS:  Michael [name] is a 54 y.o. year old White or Caucasian male patient seen here as a re-referral on 06/30/2020 from Dr Jacinto Halim, MD - his cardiologist . He had last been seen here in May 2014.  Chief concern according to patient :   Michael [name] has a CPAP machine and uses with supplies from online services his last sleep study was through our office in May 2014 and he has used the same machine for the last 7 years.  At this time he complains about a dry mouth after using his CPAP machine also his CPAP machine would be considered obsolete by now.    I have the pleasure of seeing Michael [name] today, a right -handed Caucasian male has a past medical history of (HFpEF) heart failure with preserved ejection fraction (HCC), CAD (coronary artery disease), Hyperlipidemia LDL goal <70 (08/28/2018), Obesity hypoventilation syndrome (HCC), Primary hypertension (08/28/2018), Sleep apnea, Sleep apnea with use of continuous positive airway pressure (CPAP) (08/27/2013), and Viral meningitis, at age 40.  He suffered a Myocardial infarction 5 weeks ago- Dr Jacinto Halim. He lost both parents to COVID in January 2021.   The patient had the sleep study in the year 2014 study was performed on 27 Mar 2013 upon referral by Sigmund Hazel, MD.  The patient had endorsed at that time the Epworth Sleepiness Scale at 18 out of 24 points his BMI was 55 his neck circumference 20 inches  and he was diagnosed with very severe sleep apnea.  His AHI was 121.8 he slept all night in a nonsupine position and he did not reach REM sleep.  He did retain CO2.  He was 61 minutes of the total sleep time below 89% oxygen saturation was a nadir at 76% saturation.  CPAP was initiated at 5 and titrated to 10 it was then changed to BiPAP for there was a marginal improvement.  Patient was asked to start BiPAP with a ramp of 19/613 cmH2O at the time he used a [employer] full facemask and medium size he still uses this model.  I was also able to review the patient's compliance download he has used the machine 25 out of 30 days this is a BiPAP easy breeze machine so-called variable.  His inspiratory pressure is 19 cmH2O expiratory pressure 13 cmH2O and he reaches a residual AHI of only 1.1.  This is an absolute great result he does have quite a bit of air leakage he does not have a significant number of central apneas.  What I cannot tell is how high or low his oxygen level may be.  Based on this I would like him to continue with similar settings but his machine again is 53 years old and will no longer be software supported.  My goal would  be to do a split-night polysomnography.    Sleep relevant medical history: lost 80 pounds with diet and exercise.  he stopped using CPAP but as he regained weight  He started again , about 4-5 month ago.   Family medical /sleep history: No other family member on CPAP with OSA, insomnia, sleep walkers.    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  ETOH use ; rare ,Caffeine intake in form of Coffee( 2 a day ) Soda( /) Tea (/)  energy drinks. Regular exercise in form of walking . Marland Kitchen   Hobbies :  Woodworking .  Fixing and restoration.     Sleep habits are as follows: The patient's dinner time is between  7-8PM.  The patient goes to bed at 11 PM and  continues to sleep for 5.5  hours, wakes rarely for bathroom breaks, The preferred sleep position is left side , with the support of 3 pillows.  Dreams are reportedly rare.  5- 6   AM is the usual rise time. The patient wakes up spontaneously.  He reports not feeling refreshed or restored in AM, with symptoms such as dry mouth , some morning headaches , and residual fatigue. Naps are taken infrequently.    Review of Systems: Out of a complete 14 system review, the patient complains of only the following symptoms, and all other reviewed systems are negative.:  Fatigue, sleepiness , snoring,  When not on CPAP- new is a dry mouth. fragmented sleep, short sleep time.    How likely are you to doze in the following situations: 0 = not likely, 1 = slight chance, 2 = moderate chance, 3 = high chance   Sitting and Reading? Watching Television? Sitting inactive in a public place (theater or meeting)? As a passenger in a car for an hour without a break? Lying down in the afternoon when circumstances permit? Sitting and talking to someone? Sitting quietly after lunch without alcohol? In a car, while stopped for a few minutes in traffic?   Total = 10/ 24 points   FSS endorsed at 48/ 63 points.   Social History   Socioeconomic History  . Marital status: Divorced    Spouse name: Not on file  . Number of children: 1  . Years of education: 51  . Highest education level: Not on file  Occupational History    Employer: [employer]  Tobacco Use  . Smoking status: Never Smoker  . Smokeless tobacco: Never Used  Substance and Sexual Activity  . Alcohol use: Yes    Comment: rarely  . Drug use: No  . Sexual activity: Not on file  Other Topics Concern  . Not on file  Social History Narrative   Patient is single and lives alone.   Patient is working full-time.   Patient has a college education.   Patient is right-handed.   Patient drinks 3-4 cups of coffee daily.   Social Determinants of Health    Financial Resource Strain:   . Difficulty of Paying Living Expenses:   Food Insecurity:   . Worried About Programme researcher, broadcasting/film/video in the Last Year:   . Barista in the Last Year:   Transportation Needs:   . Freight forwarder (Medical):   Marland Kitchen Lack of Transportation (Non-Medical):   Physical Activity:   . Days of Exercise  per Week:   . Minutes of Exercise per Session:   Stress:   . Feeling of Stress :   Social Connections:   . Frequency of Communication with Friends and Family:   . Frequency of Social Gatherings with Friends and Family:   . Attends Religious Services:   . Active Member of Clubs or Organizations:   . Attends BankerClub or Organization Meetings:   Marland Kitchen. Marital Status:     Family History  Problem Relation Age of Onset  . Alzheimer's disease Mother     Past Medical History:  Diagnosis Date  . (HFpEF) heart failure with preserved ejection fraction (HCC)    a. 05/2020 Echo: EF 60%, no rwma, Gr2 DD. Triv MR.  Marland Kitchen. CAD (coronary artery disease)    a. 04/2020 NSTEMI/PCI: LM nl, LAD nl, LCX nl, RCA 3719m, 85d (3.6x26 Resolute Onyx DES), RPDA nl, RPAV nl, EF 50-55%.  Marland Kitchen. Hyperlipidemia LDL goal <70 08/28/2018  . Obesity hypoventilation syndrome (HCC)   . Primary hypertension 08/28/2018  . Sleep apnea   . Sleep apnea with use of continuous positive airway pressure (CPAP) 08/27/2013  . Viral meningitis     Past Surgical History:  Procedure Laterality Date  . CARDIAC CATHETERIZATION    . CORONARY STENT INTERVENTION N/A 05/20/2020   Procedure: CORONARY STENT INTERVENTION;  Surgeon: Yvonne KendallEnd, Christopher, MD;  Location: [facility];  Service: Cardiovascular;  Laterality: N/A;  . LEFT HEART CATH AND CORONARY ANGIOGRAPHY N/A 05/20/2020   Procedure: LEFT HEART CATH AND CORONARY ANGIOGRAPHY;  Surgeon: Yvonne KendallEnd, Christopher, MD;  Location: [facility];  Service: Cardiovascular;  Laterality: N/A;     Current Outpatient Medications on File Prior to Visit  Medication Sig Dispense  Refill  . amLODipine-valsartan (EXFORGE) 10-320 MG tablet Take 1 tablet by mouth daily. 30 tablet 2  . aspirin EC 81 MG EC tablet Take 1 tablet (81 mg total) by mouth daily. Swallow whole. 30 tablet 6  . atorvastatin (LIPITOR) 80 MG tablet Take 1 tablet (80 mg total) by mouth at bedtime. 30 tablet 6  . chlorthalidone (HYGROTON) 25 MG tablet Take 1 tablet (25 mg total) by mouth daily. 30 tablet 2  . folic acid (FOLVITE) 1 MG tablet Take 1 mg by mouth daily.    . furosemide (LASIX) 20 MG tablet Take 1 tablet (20 mg total) by mouth daily as needed for fluid (Leg swelling). 30 tablet 6  . methotrexate (RHEUMATREX) 2.5 MG tablet Take 20 mg by mouth once a week. Tuesday    . metoprolol tartrate (LOPRESSOR) 25 MG tablet Take 1 tablet (25 mg total) by mouth 2 (two) times daily. 60 tablet 6  . Multiple Vitamin (MULTIVITAMIN) capsule Take 1 capsule by mouth daily.    . nitroGLYCERIN (NITROSTAT) 0.4 MG SL tablet Place 1 tablet (0.4 mg total) under the tongue every 5 (five) minutes x 3 doses as needed for chest pain. 25 tablet 3  . prasugrel (EFFIENT) 10 MG TABS tablet Take 1 tablet (10 mg total) by mouth daily. 30 tablet 6  . terbinafine (LAMISIL) 250 MG tablet Take 1 tablet (250 mg total) by mouth daily. 90 tablet 0   No current facility-administered medications on file prior to visit.    No Known Allergies  Physical exam:  Today's Vitals   06/30/20 1504  BP: 115/66  Pulse: 62  Weight: (!) 330 lb (149.7 kg)  Height: 5\' 9"  (1.753 m)   Body mass index is 48.73 kg/m.   Wt Readings from  Last 3 Encounters:  06/30/20 (!) 330 lb (149.7 kg)  06/30/20 (!) 329 lb 12.9 oz (149.6 kg)  05/26/20 (!) 339 lb (153.8 kg)     Ht Readings from Last 3 Encounters:  06/30/20 5\' 9"  (1.753 m)  06/30/20 5' 8.75" (1.746 m)  05/26/20 5\' 8"  (1.727 m)      General: The patient is awake, alert and appears not in acute distress. The patient is well groomed. Head: Normocephalic, atraumatic. Neck is supple.  Mallampati 3 plus ,  neck circumference: 20 inches . Nasal airflow patent.  Retrognathia is seen.  Dental status: intact  Cardiovascular:  Regular rate and cardiac rhythm by pulse,  without distended neck veins. Respiratory: Lungs are clear to auscultation.  Skin:  Without evidence of ankle edema, or rash. Trunk: The patient's posture is erect.   Neurologic exam : The patient is awake and alert, oriented to place and time.   Memory subjective described as intact.  Attention span & concentration ability appears normal.  Speech is fluent,  without  dysarthria, dysphonia or aphasia.  Mood and affect are appropriate.   Cranial nerves: no loss of smell or taste reported  Pupils are equal and briskly reactive to light. Funduscopic exam deferred.   Extraocular movements in vertical and horizontal planes were intact and without nystagmus. No Diplopia. Visual fields by finger perimetry are intact. Hearing was intact to soft voice and finger rubbing.    Facial sensation intact to fine touch.  Facial motor strength is symmetric and tongue in  midline.  Neck ROM : rotation, tilt and flexion extension were normal for age and shoulder shrug was symmetrical.    Motor exam:  Symmetric bulk, tone and ROM.   Normal tone without cog wheeling, symmetric grip strength .   Sensory:  Fine touch, pinprick and vibration were tested  and  normal.  Proprioception tested in the upper extremities was normal.   Coordination: Rapid alternating movements in the fingers/hands were of normal speed.  The Finger-to-nose maneuver was intact without evidence of ataxia, dysmetria or tremor.   Gait and station: Patient could rise unassisted from a seated position, walked without assistive device.  Stance is of normal width/ base and the patient turned with 3 steps.  Toe and heel walk were deferred.  Deep tendon reflexes: in the  upper and lower extremities are symmetric and intact.  Babinski response was deferred .        After spending a total time of  45  minutes face to face and additional time for physical and neurologic examination, review of laboratory studies,  personal review of imaging studies, reports and results of other testing and review of referral information / records as far as provided in visit, I have established the following assessments:  This patient has had excellent resolution of his severest apnea on VPAP. I need to order a new machine as this model is no longer software supported.   1)   superobesity - o VPAP - a BIPAP type/  2)   Hypoventilation with cyclic breathing.  3)   CAD with recent  Myocardial infarction.     My Plan is to proceed with:  1) SPLIT night study , goal is to rewrite for VPAP 19/ 13 cm water.  2) Biotin mouth wash.  3) heated hose- needed   I would like to thank 07/27/20, MD and , Md [address] [place],  [address] [place] for allowing me to meet with  and to take care of this pleasant patient.    CC: I will share my notes with PCP , RV in 2-3 month .   Electronically signed by: Melvyn Novas, MD 06/30/2020 3:15 PM  [facility] Board certified by The [employer] of Sleep Medicine and Diplomate of the [employer] of Sleep Medicine. Board certified In Neurology through the ABPN, Fellow of the [employer] of Neu[employer]. Medical Director of [facility].

## 2020-06-30 NOTE — Progress Notes (Signed)
Cardiac Individual Treatment Plan  Patient Details  Name: Michael [name] MRN: [id number] Date of Birth: 1966/10/09 Referring Provider:     CARDIAC REHAB PHASE II ORIENTATION from 06/30/2020 in [facility] CARDIAC REHAB  Referring Provider Yates Decamp, MD      Initial Encounter Date:    CARDIAC REHAB PHASE II ORIENTATION from 06/30/2020 in [facility] CARDIAC REHAB  Date 06/30/20      Visit Diagnosis: NSTEMI (non-ST elevated myocardial infarction) (HCC) 05/19/20  S/P DES RCA 05/20/20  Patient's Home Medications on Admission:  Current Outpatient Medications:  .  amLODipine-valsartan (EXFORGE) 10-320 MG tablet, Take 1 tablet by mouth daily., Disp: 30 tablet, Rfl: 2 .  aspirin EC 81 MG EC tablet, Take 1 tablet (81 mg total) by mouth daily. Swallow whole., Disp: 30 tablet, Rfl: 6 .  atorvastatin (LIPITOR) 80 MG tablet, Take 1 tablet (80 mg total) by mouth at bedtime., Disp: 30 tablet, Rfl: 6 .  chlorthalidone (HYGROTON) 25 MG tablet, Take 1 tablet (25 mg total) by mouth daily., Disp: 30 tablet, Rfl: 2 .  folic acid (FOLVITE) 1 MG tablet, Take 1 mg by mouth daily., Disp: , Rfl:  .  furosemide (LASIX) 20 MG tablet, Take 1 tablet (20 mg total) by mouth daily as needed for fluid (Leg swelling)., Disp: 30 tablet, Rfl: 6 .  methotrexate (RHEUMATREX) 2.5 MG tablet, Take 20 mg by mouth once a week. Tuesday, Disp: , Rfl:  .  metoprolol tartrate (LOPRESSOR) 25 MG tablet, Take 1 tablet (25 mg total) by mouth 2 (two) times daily., Disp: 60 tablet, Rfl: 6 .  Multiple Vitamin (MULTIVITAMIN) capsule, Take 1 capsule by mouth daily., Disp: , Rfl:  .  nitroGLYCERIN (NITROSTAT) 0.4 MG SL tablet, Place 1 tablet (0.4 mg total) under the tongue every 5 (five) minutes x 3 doses as needed for chest pain., Disp: 25 tablet, Rfl: 3 .  prasugrel (EFFIENT) 10 MG TABS tablet, Take 1 tablet (10 mg total) by mouth daily., Disp: 30 tablet, Rfl: 6 .  terbinafine (LAMISIL) 250 MG tablet, Take 1  tablet (250 mg total) by mouth daily., Disp: 90 tablet, Rfl: 0  Past Medical History: Past Medical History:  Diagnosis Date  . (HFpEF) heart failure with preserved ejection fraction (HCC)    a. 05/2020 Echo: EF 60%, no rwma, Gr2 DD. Triv MR.  Marland Kitchen CAD (coronary artery disease)    a. 04/2020 NSTEMI/PCI: LM nl, LAD nl, LCX nl, RCA 57m, 85d (3.6x26 Resolute Onyx DES), RPDA nl, RPAV nl, EF 50-55%.  Marland Kitchen Hyperlipidemia LDL goal <70 08/28/2018  . Obesity hypoventilation syndrome (HCC)   . Primary hypertension 08/28/2018  . Sleep apnea   . Sleep apnea with use of continuous positive airway pressure (CPAP) 08/27/2013  . Viral meningitis     Tobacco Use: Social History   Tobacco Use  Smoking Status Never Smoker  Smokeless Tobacco Never Used    Labs: Recent Review Advice worker    Labs for ITP Cardiac and Pulmonary Rehab Latest Ref Rng & Units 05/19/2020 05/20/2020   Cholestrol 0 - 200 mg/dL - 694(W)   LDLCALC 0 - 99 mg/dL - 546(E)   HDL >70 mg/dL - 35(K)   Trlycerides <093 mg/dL - 818(E)   Hemoglobin X9B 4.8 - 5.6 % 5.3 -      Capillary Blood Glucose: No results found for: GLUCAP   Exercise Target Goals: Exercise Program Goal: Individual exercise prescription set using results from initial 6 min walk test and  THRR while considering  patient's activity barriers and safety.   Exercise Prescription Goal: Starting with aerobic activity 30 plus minutes a day, 3 days per week for initial exercise prescription. Provide home exercise prescription and guidelines that participant acknowledges understanding prior to discharge.  Activity Barriers & Risk Stratification:  Activity Barriers & Cardiac Risk Stratification - 06/30/20 1225      Activity Barriers & Cardiac Risk Stratification   Activity Barriers Back Problems;Other (comment)    Comments Pt has Dyshidrotic Eczema. This condition can cuase blisteing on the feet which can make walking uncomfotable/painful.    Cardiac Risk Stratification  High           6 Minute Walk:  6 Minute Walk    Row Name 06/30/20 0947         6 Minute Walk   Phase Initial     Distance 1721 feet     Walk Time 6 minutes     # of Rest Breaks 0     MPH 3.26     METS 3.31     RPE 12     Perceived Dyspnea  1     VO2 Peak 11.58     Symptoms Yes (comment)     Comments Mild SOB: RPD = 1     Resting HR 66 bpm     Resting BP 104/68     Resting Oxygen Saturation  95 %     Exercise Oxygen Saturation  during 6 min walk 97 %     Max Ex. HR 98 bpm     Max Ex. BP 138/64     2 Minute Post BP 130/60            Oxygen Initial Assessment:   Oxygen Re-Evaluation:   Oxygen Discharge (Final Oxygen Re-Evaluation):   Initial Exercise Prescription:  Initial Exercise Prescription - 06/30/20 1200      Date of Initial Exercise RX and Referring Provider   Date 06/30/20    Referring Provider Yates DecampGanji, Jay, MD    Expected Discharge Date 09/02/20      T5 Nustep   Level 3    SPM 75    Minutes 15    METs 2.5      Track   Laps 12    Minutes 15    METs 3.09      Prescription Details   Frequency (times per week) 3    Duration Progress to 30 minutes of continuous aerobic without signs/symptoms of physical distress      Intensity   THRR 40-80% of Max Heartrate 67-134    Ratings of Perceived Exertion 11-13    Perceived Dyspnea 0-4      Progression   Progression Continue progressive overload as per policy without signs/symptoms or physical distress.      Resistance Training   Training Prescription Yes    Weight 5 lbs    Reps 10-15           Perform Capillary Blood Glucose checks as needed.  Exercise Prescription Changes:   Exercise Comments:   Exercise Goals and Review:   Exercise Goals    Row Name 06/30/20 1231             Exercise Goals   Increase Physical Activity Yes       Intervention Provide advice, education, support and counseling about physical activity/exercise needs.;Develop an individualized exercise  prescription for aerobic and resistive training based on initial evaluation findings, risk stratification, comorbidities and participant's  personal goals.       Expected Outcomes Short Term: Attend rehab on a regular basis to increase amount of physical activity.;Long Term: Add in home exercise to make exercise part of routine and to increase amount of physical activity.;Long Term: Exercising regularly at least 3-5 days a week.       Increase Strength and Stamina Yes       Intervention Provide advice, education, support and counseling about physical activity/exercise needs.;Develop an individualized exercise prescription for aerobic and resistive training based on initial evaluation findings, risk stratification, comorbidities and participant's personal goals.       Expected Outcomes Short Term: Increase workloads from initial exercise prescription for resistance, speed, and METs.;Short Term: Perform resistance training exercises routinely during rehab and add in resistance training at home;Long Term: Improve cardiorespiratory fitness, muscular endurance and strength as measured by increased METs and functional capacity ( )       Able to understand and use rate of perceived exertion (RPE) scale Yes       Intervention Provide education and explanation on how to use RPE scale       Expected Outcomes Short Term: Able to use RPE daily in rehab to express subjective intensity level;Long Term:  Able to use RPE to guide intensity level when exercising independently       Knowledge and understanding of Target Heart Rate Range (THRR) Yes       Intervention Provide education and explanation of THRR including how the numbers were predicted and where they are located for reference       Expected Outcomes Short Term: Able to state/look up THRR;Long Term: Able to use THRR to govern intensity when exercising independently;Short Term: Able to use daily as guideline for intensity in rehab       Able to check pulse  independently Yes       Intervention Provide education and demonstration on how to check pulse in carotid and radial arteries.;Review the importance of being able to check your own pulse for safety during independent exercise       Expected Outcomes Short Term: Able to explain why pulse checking is important during independent exercise;Long Term: Able to check pulse independently and accurately       Understanding of Exercise Prescription Yes       Intervention Provide education, explanation, and written materials on patient's individual exercise prescription       Expected Outcomes Short Term: Able to explain program exercise prescription;Long Term: Able to explain home exercise prescription to exercise independently              Exercise Goals Re-Evaluation :    Discharge Exercise Prescription (Final Exercise Prescription Changes):   Nutrition:  Target Goals: Understanding of nutrition guidelines, daily intake of sodium 1500mg , cholesterol 200mg , calories 30% from fat and 7% or less from saturated fats, daily to have 5 or more servings of fruits and vegetables.  Biometrics:  Pre Biometrics - 06/30/20 0900      Pre Biometrics   Waist Circumference 62 inches    Hip Circumference 61 inches    Waist to Hip Ratio 1.02 %    Triceps Skinfold 9 mm    % Body Fat 43.6 %    Grip Strength 58.5 kg    Flexibility 9.5 in    Single Leg Stand 18.93 seconds            Nutrition Therapy Plan and Nutrition Goals:   Nutrition Assessments:   Nutrition Goals Re-Evaluation:  Nutrition Goals Discharge (Final Nutrition Goals Re-Evaluation):   Psychosocial: Target Goals: Acknowledge presence or absence of significant depression and/or stress, maximize coping skills, provide positive support system. Participant is able to verbalize types and ability to use techniques and skills needed for reducing stress and depression.  Initial Review & Psychosocial Screening:  Initial Psych Review &  Screening - 06/30/20 1409      Initial Review   Current issues with None Identified      Family Dynamics   Good Support System? Yes   Cinsere has his girlfriend for support.     Barriers   Psychosocial barriers to participate in program There are no identifiable barriers or psychosocial needs.      Screening Interventions   Interventions Encouraged to exercise;Provide feedback about the scores to participant    Expected Outcomes Long Term Goal: Stressors or current issues are controlled or eliminated.;Short Term goal: Identification and review with participant of any Quality of Life or Depression concerns found by scoring the questionnaire.;Long Term goal: The participant improves quality of Life and PHQ9 Scores as seen by post scores and/or verbalization of changes           Quality of Life Scores:  Quality of Life - 06/30/20 1219      Quality of Life   Select Quality of Life      Quality of Life Scores   Health/Function Pre 19.07 %    Socioeconomic Pre 21.94 %    Psych/Spiritual Pre 20.93 %    Family Pre 27.6 %    GLOBAL Pre 21.31 %          Scores of 19 and below usually indicate a poorer quality of life in these areas.  A difference of  2-3 points is a clinically meaningful difference.  A difference of 2-3 points in the total score of the Quality of Life Index has been associated with significant improvement in overall quality of life, self-image, physical symptoms, and general health in studies assessing change in quality of life.  PHQ-9: Recent Review Flowsheet Data    Depression screen [facility] 2/9 06/30/2020 08/31/2019 08/28/2018   Decreased Interest 0 1 0   Down, Depressed, Hopeless 0 0 0   PHQ - 2 Score 0 1 0   Altered sleeping - 3 -   Tired, decreased energy - 3 -   Change in appetite - 1 -   Feeling bad or failure about yourself  - 0 -   Trouble concentrating - 2 -   Moving slowly or fidgety/restless - 0 -   Suicidal thoughts - 0 -   PHQ-9 Score - 10 -    Difficult doing work/chores - Extremely dIfficult -     Interpretation of Total Score  Total Score Depression Severity:  1-4 = Minimal depression, 5-9 = Mild depression, 10-14 = Moderate depression, 15-19 = Moderately severe depression, 20-27 = Severe depression   Psychosocial Evaluation and Intervention:   Psychosocial Re-Evaluation:   Psychosocial Discharge (Final Psychosocial Re-Evaluation):   Vocational Rehabilitation: Provide vocational rehab assistance to qualifying candidates.   Vocational Rehab Evaluation & Intervention:  Vocational Rehab - 06/30/20 1410      Initial Vocational Rehab Evaluation & Intervention   Assessment shows need for Vocational Rehabilitation No   Devron works full time and does not need vocational rehab at this time.          Education: Education Goals: Education classes will be provided on a weekly basis, covering required topics. Participant  will state understanding/return demonstration of topics presented.  Learning Barriers/Preferences:  Learning Barriers/Preferences - 06/30/20 1220      Learning Barriers/Preferences   Learning Barriers None    Learning Preferences Skilled Demonstration;Individual Instruction;Group Instruction           Education Topics: Hypertension, Hypertension Reduction -Define heart disease and high blood pressure. Discus how high blood pressure affects the body and ways to reduce high blood pressure.   Exercise and Your Heart -Discuss why it is important to exercise, the FITT principles of exercise, normal and abnormal responses to exercise, and how to exercise safely.   Angina -Discuss definition of angina, causes of angina, treatment of angina, and how to decrease risk of having angina.   Cardiac Medications -Review what the following cardiac medications are used for, how they affect the body, and side effects that may occur when taking the medications.  Medications include Aspirin, Beta blockers, calcium  channel blockers, ACE Inhibitors, angiotensin receptor blockers, diuretics, digoxin, and antihyperlipidemics.   Congestive Heart Failure -Discuss the definition of CHF, how to live with CHF, the signs and symptoms of CHF, and how keep track of weight and sodium intake.   Heart Disease and Intimacy -Discus the effect sexual activity has on the heart, how changes occur during intimacy as we age, and safety during sexual activity.   Smoking Cessation / COPD -Discuss different methods to quit smoking, the health benefits of quitting smoking, and the definition of COPD.   Nutrition I: Fats -Discuss the types of cholesterol, what cholesterol does to the heart, and how cholesterol levels can be controlled.   Nutrition II: Labels -Discuss the different components of food labels and how to read food label   Heart Parts/Heart Disease and PAD -Discuss the anatomy of the heart, the pathway of blood circulation through the heart, and these are affected by heart disease.   Stress I: Signs and Symptoms -Discuss the causes of stress, how stress may lead to anxiety and depression, and ways to limit stress.   Stress II: Relaxation -Discuss different types of relaxation techniques to limit stress.   Warning Signs of Stroke / TIA -Discuss definition of a stroke, what the signs and symptoms are of a stroke, and how to identify when someone is having stroke.   Knowledge Questionnaire Score:  Knowledge Questionnaire Score - 06/30/20 1221      Knowledge Questionnaire Score   Pre Score 23/24           Core Components/Risk Factors/Patient Goals at Admission:  Personal Goals and Risk Factors at Admission - 06/30/20 1414      Core Components/Risk Factors/Patient Goals on Admission    Weight Management Yes;Obesity;Weight Loss    Intervention Weight Management: Develop a combined nutrition and exercise program designed to reach desired caloric intake, while maintaining appropriate intake of  nutrient and fiber, sodium and fats, and appropriate energy expenditure required for the weight goal.;Weight Management: Provide education and appropriate resources to help participant work on and attain dietary goals.;Weight Management/Obesity: Establish reasonable short term and long term weight goals.;Obesity: Provide education and appropriate resources to help participant work on and attain dietary goals.    Admit Weight 329 lb 12.9 oz (149.6 kg)    Goal Weight: Long Term 225 lb (102.1 kg)    Expected Outcomes Short Term: Continue to assess and modify interventions until short term weight is achieved;Long Term: Adherence to nutrition and physical activity/exercise program aimed toward attainment of established weight goal;Weight Maintenance: Understanding of the  daily nutrition guidelines, which includes 25-35% calories from fat, 7% or less cal from saturated fats, less than  cholesterol, less than 1.5gm of sodium, & 5 or more servings of fruits and vegetables daily;Weight Loss: Understanding of general recommendations for a balanced deficit meal plan, which promotes 1-2 lb weight loss per week and includes a negative energy balance of [phone] kcal/d;Understanding recommendations for meals to include 15-35% energy as protein, 25-35% energy from fat, 35-60% energy from carbohydrates, less than  of dietary cholesterol, 20-35 gm of total fiber daily;Understanding of distribution of calorie intake throughout the day with the consumption of 4-5 meals/snacks    Hypertension Yes    Intervention Provide education on lifestyle modifcations including regular physical activity/exercise, weight management, moderate sodium restriction and increased consumption of fresh fruit, vegetables, and low fat dairy, alcohol moderation, and smoking cessation.;Monitor prescription use compliance.    Expected Outcomes Short Term: Continued assessment and intervention until BP is < 140/54mm HG in hypertensive  participants. < 130/4mm HG in hypertensive participants with diabetes, heart failure or chronic kidney disease.;Long Term: Maintenance of blood pressure at goal levels.    Lipids Yes    Intervention Provide education and support for participant on nutrition & aerobic/resistive exercise along with prescribed medications to achieve LDL 70mg , HDL >40mg .    Expected Outcomes Short Term: Participant states understanding of desired cholesterol values and is compliant with medications prescribed. Participant is following exercise prescription and nutrition guidelines.;Long Term: Cholesterol controlled with medications as prescribed, with individualized exercise RX and with personalized nutrition plan. Value goals: LDL < , HDL > 40 mg.    Stress Yes    Intervention Offer individual and/or small group education and counseling on adjustment to heart disease, stress management and health-related lifestyle change. Teach and support self-help strategies.;Refer participants experiencing significant psychosocial distress to appropriate mental health specialists for further evaluation and treatment. When possible, include family members and significant others in education/counseling sessions.    Expected Outcomes Short Term: Participant demonstrates changes in health-related behavior, relaxation and other stress management skills, ability to obtain effective social support, and compliance with psychotropic medications if prescribed.;Long Term: Emotional wellbeing is indicated by absence of clinically significant psychosocial distress or social isolation.           Core Components/Risk Factors/Patient Goals Review:    Core Components/Risk Factors/Patient Goals at Discharge (Final Review):    ITP Comments:  ITP Comments    Row Name 06/30/20 1403           ITP Comments Dr Armanda Magic MD, Medical Director              Comments: Minerva Areola attended orientation on 06/30/2020 to review rules and guidelines  for program.  Completed 6 minute walk test, Intitial ITP, and exercise prescription.  VSS. Telemetry-Sinus Rhythm. . Safety measures and social distancing in place per CDC guidelines.Prior to Nana's walk test the patient reported that he feels light headed when he changes positions or bends over. Orthostatic blood pressures are as follows. Lying BP 104/50, heart rate 60. Sitting blood pressure 104/60, heart rate 63. Rilen reported feeling a little light headed when changing positions. Standing blood pressure 104/62, heart rate 67. No symptoms reported during the walk test. Will forward to Dr Jacinto Halim.Gladstone Lighter, RN,BSN 06/30/2020 2:32 PM

## 2020-06-30 NOTE — Progress Notes (Signed)
Cardiac Rehab Medication Review by a Nurse  Does the patient  feel that his/her medications are working for him/her?  yes  Has the patient been experiencing any side effects to the medications prescribed?  yes  Does the patient measure his/her own blood pressure or blood glucose at home?  yes   Does the patient have any problems obtaining medications due to transportation or finances?   no  Understanding of regimen: good Understanding of indications: good Potential of compliance: good    Nurse  comments: Patient is complaint with his medications and taking as prescribed. Michael [name] reports that he feel lightheaded when he changes positions. Orthostatic's checked. Blood pressures noted in the low 100's. Will forward to Dr Jacinto Halim for review.Gladstone Lighter, RN,BSN 06/30/2020 2:20 PM    Michael [name] 06/30/2020 2:16 PM

## 2020-07-04 ENCOUNTER — Ambulatory Visit: Payer: BC Managed Care – PPO | Admitting: Cardiology

## 2020-07-04 ENCOUNTER — Encounter: Payer: Self-pay | Admitting: Cardiology

## 2020-07-04 ENCOUNTER — Other Ambulatory Visit: Payer: Self-pay

## 2020-07-04 VITALS — BP 97/65 | HR 60 | Resp 15 | Ht 69.0 in | Wt 330.0 lb

## 2020-07-04 DIAGNOSIS — I10 Essential (primary) hypertension: Secondary | ICD-10-CM

## 2020-07-04 DIAGNOSIS — E7800 Pure hypercholesterolemia, unspecified: Secondary | ICD-10-CM

## 2020-07-04 DIAGNOSIS — N522 Drug-induced erectile dysfunction: Secondary | ICD-10-CM

## 2020-07-04 DIAGNOSIS — G4733 Obstructive sleep apnea (adult) (pediatric): Secondary | ICD-10-CM

## 2020-07-04 DIAGNOSIS — I25110 Atherosclerotic heart disease of native coronary artery with unstable angina pectoris: Secondary | ICD-10-CM

## 2020-07-04 MED ORDER — VALSARTAN 160 MG PO TABS: 160.0000 mg | ORAL_TABLET | Freq: Every day | ORAL | 3 refills | Status: DC

## 2020-07-04 NOTE — Progress Notes (Signed)
Primary Physician/Referring:  Ardith Dark, MD  Patient ID: Michael [name], male    DOB: [birth year], 54 y.o.   MRN: [id number]  Chief Complaint  Patient presents with   Follow-up    4 week   Coronary Artery Disease   Hypertension   HPI:    Michael [name]  is a 54 y.o. Caucasian male with hypertension, hyperlipidemia, obesity and obstructive sleep apnea on CPAP, presented to the emergency room on 05/19/2020 with central chest tightness with radiation to the left shoulder and jaw.  He was positive for non-ST elevation myocardial infarction, underwent cardiac catheterization the following day and successful angioplasty to the right coronary artery.    On his last office visit due to uncontrolled hypertension, I discontinued losartan and switched him to Exforge and also added chlorthalidone.  Since then he has noticed episodes of dizziness and low blood pressure.  He has not had any recurrence of angina, he has been compliant with CPAP.  Past Medical History:  Diagnosis Date   (HFpEF) heart failure with preserved ejection fraction (HCC)    a. 05/2020 Echo: EF 60%, no rwma, Gr2 DD. Triv MR.   CAD (coronary artery disease)    a. 04/2020 NSTEMI/PCI: LM nl, LAD nl, LCX nl, RCA 62m, 85d (3.6x26 Resolute Onyx DES), RPDA nl, RPAV nl, EF 50-55%.   Hyperlipidemia LDL goal <70 08/28/2018   Obesity hypoventilation syndrome (HCC)    Primary hypertension 08/28/2018   Sleep apnea    Sleep apnea with use of continuous positive airway pressure (CPAP) 08/27/2013   Viral meningitis    Past Surgical History:  Procedure Laterality Date   CARDIAC CATHETERIZATION     CORONARY STENT INTERVENTION N/A 05/20/2020   Procedure: CORONARY STENT INTERVENTION;  Surgeon: Yvonne Kendall, MD;  Location: [facility];  Service: Cardiovascular;  Laterality: N/A;   LEFT HEART CATH AND CORONARY ANGIOGRAPHY N/A 05/20/2020   Procedure: LEFT HEART CATH AND CORONARY ANGIOGRAPHY;  Surgeon: Yvonne Kendall,  MD;  Location: [facility];  Service: Cardiovascular;  Laterality: N/A;   Family History  Problem Relation Age of Onset   Alzheimer's disease Mother     Social History   Tobacco Use   Smoking status: Never Smoker   Smokeless tobacco: Never Used  Substance Use Topics   Alcohol use: Yes    Comment: rarely   Marital Status: Divorced  ROS  Review of Systems  Constitutional: Positive for malaise/fatigue.  Cardiovascular: Positive for dyspnea on exertion and leg swelling. Negative for chest pain.  Gastrointestinal: Negative for melena.   Objective  Blood pressure 97/65, pulse 60, resp. rate 15, height 5\' 9"  (1.753 m), weight (!) 330 lb (149.7 kg), SpO2 98 %.  Vitals with BMI 07/04/2020 06/30/2020 06/30/2020  Height 5\' 9"  5\' 9"  5' 8.75"  Weight 330 lbs 330 lbs 329 lbs 13 oz  BMI 48.71 48.71 49.07  Systolic 97 115 104  Diastolic 65 66 68  Pulse 60 62 66     Physical Exam Constitutional:      Comments:   He is well-built and morbidly obese in no acute distress.  Cardiovascular:     Rate and Rhythm: Normal rate and regular rhythm.     Pulses: Intact distal pulses.     Heart sounds: Normal heart sounds. No murmur heard.  No gallop.      Comments: No leg edema, no JVD. Pulmonary:     Effort: Pulmonary effort is normal.     Breath  sounds: Normal breath sounds.  Abdominal:     General: Bowel sounds are normal.     Palpations: Abdomen is soft.    Laboratory examination:   Recent Labs    05/19/20 1639 05/21/20 0701  NA 140 138  K 3.8 3.8  CL 106 106  CO2 24 22  GLUCOSE 109* 103*  BUN 14 9  CREATININE 0.93 0.94  CALCIUM 9.5 9.0  GFRNONAA >60 >60  GFRAA >60 >60   CrCl cannot be calculated (Patient's most recent lab result is older than the maximum 21 days allowed.).  CMP Latest Ref Rng & Units 05/21/2020 05/19/2020 04/08/2020  Glucose 70 - 99 mg/dL 361(W) 431(V) -  BUN 6 - 20 mg/dL 9 14 -  Creatinine 4.00 - 1.24 mg/dL 8.67 6.19 -  Sodium 509 - 145 mmol/L 138  140 -  Potassium 3.5 - 5.1 mmol/L 3.8 3.8 -  Chloride 98 - 111 mmol/L 106 106 -  CO2 22 - 32 mmol/L 22 24 -  Calcium 8.9 - 10.3 mg/dL 9.0 9.5 -  Total Protein 6.0 - 8.5 g/dL - 6.6 7.4  Total Bilirubin 0.0 - 1.2 mg/dL - 0.5 0.5  Alkaline Phos 48 - 121 IU/L - 68 71  AST 0 - 40 IU/L - 28 26  ALT 0 - 44 IU/L - 36 31   CBC Latest Ref Rng & Units 05/21/2020 05/19/2020  WBC 4.0 - 10.5 K/uL 8.9 8.1  Hemoglobin 13.0 - 17.0 g/dL 32.6 71.2  Hematocrit 39 - 52 % 44.4 47.5  Platelets 150 - 400 K/uL 175 188    Lipid Panel Lipid Panel Recent Labs    05/20/20 0150  CHOL 229*  TRIG 155*  LDLCALC 163*  VLDL 31  HDL 35*  CHOLHDL 6.5    HEMOGLOBIN A1C Lab Results  Component Value Date   HGBA1C 5.3 05/19/2020   MPG 105.41 05/19/2020   TSH Recent Labs    05/20/20 0159  TSH 4.002   Medications and allergies  No Known Allergies   Current Outpatient Medications  Medication Instructions   aspirin 81 mg, Oral, Daily, Swallow whole.   atorvastatin (LIPITOR) 80 mg, Oral, Daily at bedtime   chlorthalidone (HYGROTON) 25 mg, Oral, Daily   folic acid (FOLVITE) 1 mg, Oral, Daily   furosemide (LASIX) 20 mg, Oral, Daily PRN   methotrexate (RHEUMATREX) 20 mg, Oral, Weekly, Tuesday   metoprolol tartrate (LOPRESSOR) 25 mg, Oral, 2 times daily   Multiple Vitamin (MULTIVITAMIN) capsule 1 capsule, Oral, Daily   nitroGLYCERIN (NITROSTAT) 0.4 mg, Sublingual, Every 5 min x3 PRN   prasugrel (EFFIENT) 10 mg, Oral, Daily   terbinafine (LAMISIL) 250 mg, Oral, Daily   valsartan (DIOVAN) 160 mg, Oral, Daily    Radiology:   No results found.  Cardiac Studies:   Coronary angiogram 05/20/2020 1. Severe single-vessel coronary artery disease with sequential 40-50% mid and 80-90% distal RCA stenoses.  The distal RCA lesion appears ulcerated and is likely the culprit lesion for the patient's NSTEMI. 2. No angiographically significant coronary artery disease involving the left coronary  artery. 3. Low normal left ventricular systolic function with basal and mid inferior hypokinesis.  Left ventricular filling pressure is moderately elevated (LVEDP ~30 mmHg). 4. Successful PCI to the distal RCA using Resolute Onyx 3.5 x 26 mm drug-eluting stent (postdilated to 4.1 mm) with 0% residual stenosis and TIMI-3 flow.  Echocardiogram 05/21/2020:  1. Left ventricular ejection fraction, by estimation, is 60%. The left  ventricle has normal function. The left  ventricle has no regional wall  motion abnormalities. There is moderate left ventricular hypertrophy. Left  ventricular diastolic parameters are consistent with Grade II diastolic dysfunction (pseudonormalization).  2. Right ventricular systolic function is normal. The right ventricular  size is normal. Tricuspid regurgitation signal is inadequate for assessing  PA pressure.  3. The mitral valve is grossly normal. Trivial mitral valve  regurgitation. No evidence of mitral stenosis.  4. The aortic valve is tricuspid. Aortic valve regurgitation is not visualized. No aortic stenosis is present.  5. The inferior vena cava is dilated in size with >50% respiratory  variability, suggesting right atrial pressure of 8 mmHg.  EKG  EKG 05/26/2020: Normal sinus rhythm at the rate of 64 bpm, normal axis, anteroseptal infarct old.  Nonspecific T abnormality.  Borderline criteria for LVH.     EKG 05/21/2020: Normal sinus rhythm at rate of 67 bpm, normal axis, TWI, inferior and lateral ischemia.  Assessment     ICD-10-CM   1. Coronary artery disease involving native coronary artery of native heart with unstable angina pectoris (HCC)  I25.110   2. Primary hypertension  I10 valsartan (DIOVAN) 160 MG tablet  3. Hypercholesteremia  E78.00   4. Obstructive sleep apnea on CPAP  G47.33    Z99.89   5. Drug-induced erectile dysfunction  N52.2 Testosterone,Free and Total     Medications Discontinued During This Encounter  Medication Reason    amLODipine-valsartan (EXFORGE) 10-320 MG tablet Dose change     Recommendations:   Michael [name]  is a 54 y.o. Caucasian male with hypertension, hyperlipidemia, obesity and obstructive sleep apnea on CPAP, presented to the emergency room on 05/19/2020 with central chest tightness with radiation to the left shoulder and jaw.  He was positive for non-ST elevation myocardial infarction, underwent cardiac catheterization the following day and successful angioplasty to the right coronary artery.   His blood pressure is now well controlled with the changes I made on his last office visit however he has recorded very low blood pressure and he is symptomatic with dizziness.  I'll discontinue Exforge 160/5 mg and switch him to plain valsartan 160 mg daily.  He'll continue to monitor his blood pressure closely.  He wants to supplement his salt to a potassium salt.  Will check CMP a week after he starts the supplement.  Still advised him to be careful with excessive use of potassium salts.  He needs lipid profile testing and also CBC as he is on statins and also dual antiplatelet therapy.  Will be aggressive with his LDL monitoring.  He has had erectile dysfunction since being on the medications.  I'll check his total and free testosterone levels and forwarded to his PCP as well.  With regard to sleep apnea, he has now established with Dr. Kandyce Rud.  I'll see him back in 6 months for follow-up.   Michael Decamp, MD, [facility] 07/04/2020, 12:24 PM Office: [phone]

## 2020-07-06 ENCOUNTER — Ambulatory Visit: Payer: BC Managed Care – PPO | Admitting: Sports Medicine

## 2020-07-06 ENCOUNTER — Encounter: Payer: Self-pay | Admitting: Sports Medicine

## 2020-07-06 ENCOUNTER — Other Ambulatory Visit: Payer: Self-pay

## 2020-07-06 DIAGNOSIS — Z79899 Other long term (current) drug therapy: Secondary | ICD-10-CM

## 2020-07-06 DIAGNOSIS — Z872 Personal history of diseases of the skin and subcutaneous tissue: Secondary | ICD-10-CM | POA: Diagnosis not present

## 2020-07-06 DIAGNOSIS — B351 Tinea unguium: Secondary | ICD-10-CM

## 2020-07-06 NOTE — Progress Notes (Signed)
Subjective: Michael [name] is a 54 y.o. male patient seen today in office for follow up evaluation of nail fungus on Lamisil. Patient reports that things are doing better his left big toenail is slowly growing back denies any redness warmth drainage or any other acute symptoms at this time.  After last visit patient had issue with heart and had to have a heart catheterization because of heart attack.  Patient reports that he is doing much better and is followed closely by cardiology.   Patient Active Problem List   Diagnosis Date Noted  . CAD (coronary artery disease) 05/21/2020  . (HFpEF) heart failure with preserved ejection fraction (HCC) 05/21/2020  . NSTEMI (non-ST elevated myocardial infarction) (HCC) 05/20/2020  . Dyshidrosis 08/31/2019  . Insomnia 08/31/2019  . Hyperlipidemia LDL goal <70 08/28/2018  . Erectile dysfunction 08/28/2018  . Primary hypertension 08/28/2018  . OSA on CPAP 02/24/2014  . Obesity, morbid (HCC) 02/24/2014  . Sleep apnea with use of continuous positive airway pressure (CPAP) 08/27/2013  . Nocturia 08/27/2013  . Obesity hypoventilation syndrome (HCC)     Current Outpatient Medications on File Prior to Visit  Medication Sig Dispense Refill  . aspirin EC 81 MG EC tablet Take 1 tablet (81 mg total) by mouth daily. Swallow whole. 30 tablet 6  . atorvastatin (LIPITOR) 80 MG tablet Take 1 tablet (80 mg total) by mouth at bedtime. 30 tablet 6  . chlorthalidone (HYGROTON) 25 MG tablet Take 1 tablet (25 mg total) by mouth daily. 30 tablet 2  . folic acid (FOLVITE) 1 MG tablet Take 1 mg by mouth daily.    . furosemide (LASIX) 20 MG tablet Take 1 tablet (20 mg total) by mouth daily as needed for fluid (Leg swelling). 30 tablet 6  . methotrexate (RHEUMATREX) 2.5 MG tablet Take 20 mg by mouth once a week. Tuesday    . metoprolol tartrate (LOPRESSOR) 25 MG tablet Take 1 tablet (25 mg total) by mouth 2 (two) times daily. 60 tablet 6  . Multiple Vitamin (MULTIVITAMIN)  capsule Take 1 capsule by mouth daily.    . nitroGLYCERIN (NITROSTAT) 0.4 MG SL tablet Place 1 tablet (0.4 mg total) under the tongue every 5 (five) minutes x 3 doses as needed for chest pain. 25 tablet 3  . prasugrel (EFFIENT) 10 MG TABS tablet Take 1 tablet (10 mg total) by mouth daily. 30 tablet 6  . terbinafine (LAMISIL) 250 MG tablet Take 1 tablet (250 mg total) by mouth daily. 90 tablet 0  . valsartan (DIOVAN) 160 MG tablet Take 1 tablet (160 mg total) by mouth daily. 90 tablet 3   No current facility-administered medications on file prior to visit.    No Known Allergies  Objective: Physical Exam  General: Well developed, nourished, no acute distress, awake, alert and oriented x 3  Vascular: Dorsalis pedis artery 1/4 bilateral, Posterior tibial artery 1/4 bilateral, skin temperature warm to warm proximal to distal bilateral lower extremities, mild varicosities, pedal hair present bilateral.  Neurological: Gross sensation present via light touch bilateral.   Dermatological: Skin is warm, dry, and supple bilateral, nails are tender, short thick, with regrowth of the left hallux nail that is slightly thickened but very minimal subungual debris noted, no webspace macerations present bilateral, blood blister noted to plantar right hallux with no signs of infection, no open lesions present bilateral, no callus/corns/hyperkeratotic tissue present bilateral.  Dry scaly skin history of eczema plantar surfaces of both feet.  No signs of infection bilateral.  Musculoskeletal: Asymptomatic pes planus boney deformities noted bilateral. Muscular strength within normal limits without painon range of motion. No pain with calf compression bilateral.  Assessment and Plan:  Problem List Items Addressed This Visit    None    Visit Diagnoses    Encounter for long-term (current) use of high-risk medication    -  Primary   Nail fungus       History of eczema          -Examined patient -And no  additional charge mechanically debrided nails x10 using a sterile nail nipper without incident -Cont with Lamisil until completed estimated end date of 07/16/2020 -Advised good hygiene habits and educated patient on proper foot care to prevent re-infection like before -Advised patient if he wants to start laser would recommend waiting 1 month after completing Lamisil starting late September or early October for the first treatment for his nail fungus -Patient to return to [facility] office for laser when ready or sooner if symptoms worsen.  Asencion Islam, DPM

## 2020-07-11 ENCOUNTER — Other Ambulatory Visit: Payer: Self-pay

## 2020-07-11 ENCOUNTER — Encounter (HOSPITAL_COMMUNITY)
Admission: RE | Admit: 2020-07-11 | Discharge: 2020-07-11 | Disposition: A | Discharge status: Home / Self Care | Payer: BC Managed Care – PPO | Source: Ambulatory Visit | Attending: Cardiology | Admitting: Cardiology

## 2020-07-11 DIAGNOSIS — I214 Non-ST elevation (NSTEMI) myocardial infarction: Secondary | ICD-10-CM

## 2020-07-11 DIAGNOSIS — Z955 Presence of coronary angioplasty implant and graft: Secondary | ICD-10-CM | POA: Diagnosis not present

## 2020-07-11 NOTE — Progress Notes (Signed)
Daily Session Note  Patient Details  Name: Michael [name] MRN: [id number] Date of Birth: 28-Jun-1966 Referring Provider:     CARDIAC REHAB PHASE II ORIENTATION from 06/30/2020 in [place]  Referring Provider Adrian Prows, MD      Encounter Date: 07/11/2020  Check In:  Session Check In - 07/11/20 0907      Check-In   Supervising physician immediately available to respond to emergencies [facility] immediately available    Physician(s) Dr. Sherral Hammers    Location [facility]    Staff Present Lesly Rubenstein, MS, EP-C, CCRP;Tyara Nevels, MS,ACSM CEP, Exercise Physiologist;Kassity Woodson Rollene Rotunda, RN, Deland Pretty, MS, ACSM CEP, Exercise Physiologist    Virtual Visit No    Medication changes reported     Yes    Comments Exforge discontinued. Started Valsartan 189m daily.    Fall or balance concerns reported    No    Tobacco Cessation No Change    Current number of cigarettes/nicotine per day     0    Warm-up and Cool-down Performed on first and last piece of equipment    Resistance Training Performed Yes    VAD Patient? No    PAD/SET Patient? No      Pain Assessment   Currently in Pain? No/denies    Pain Score 0-No pain    Multiple Pain Sites No           Capillary Blood Glucose: No results found for this or any previous visit (from the past 24 hour(s)).   Exercise Prescription Changes - 07/11/20 1000      Response to Exercise   Blood Pressure (Admit) 110/70    Blood Pressure (Exercise) 116/58    Blood Pressure (Exit) 100/62    Heart Rate (Admit) 68 bpm    Heart Rate (Exercise) 94 bpm    Heart Rate (Exit) 62 bpm    Rating of Perceived Exertion (Exercise) 11    Perceived Dyspnea (Exercise) 0    Symptoms None    Comments Pt's first day of exercise    Duration Progress to 30 minutes of  aerobic without signs/symptoms of physical distress    Intensity THRR unchanged      Progression   Progression Continue to progress  workloads to maintain intensity without signs/symptoms of physical distress.    Average METs 2.5      Resistance Training   Training Prescription Yes    Weight 5 lbs    Reps 10-15    Time 10 Minutes      Interval Training   Interval Training No      T5 Nustep   Level 3    SPM 70    Minutes 15    METs 2      Track   Laps 18    Minutes 15    METs 4           Social History   Tobacco Use  Smoking Status Never Smoker  Smokeless Tobacco Never Used    Goals Met:  Exercise tolerated well Personal goals reviewed No report of cardiac concerns or symptoms Strength training completed today  Goals Unmet:  Not Applicable  Comments: Pt started cardiac rehab today.  Pt tolerated light exercise without difficulty. VSS, telemetry-, asymptomatic.  Medication list reconciled. Pt denies barriers to medicaiton compliance.  PSYCHOSOCIAL ASSESSMENT:  PHQ-0. Pt exhibits positive coping skills, hopeful outlook with supportive family. No psychosocial needs identified at this time, no psychosocial interventions  necessary. Pt oriented to exercise equipment and routine. Understanding verbalized.   Dr. Fransico Him is Medical Director for Cardiac Rehab at [facility].

## 2020-07-13 ENCOUNTER — Other Ambulatory Visit: Payer: Self-pay

## 2020-07-13 ENCOUNTER — Encounter (HOSPITAL_COMMUNITY)
Admission: RE | Admit: 2020-07-13 | Discharge: 2020-07-13 | Disposition: A | Discharge status: Home / Self Care | Payer: BC Managed Care – PPO | Source: Ambulatory Visit | Attending: Cardiology | Admitting: Cardiology

## 2020-07-13 VITALS — Wt 330.0 lb

## 2020-07-13 DIAGNOSIS — I214 Non-ST elevation (NSTEMI) myocardial infarction: Secondary | ICD-10-CM

## 2020-07-13 DIAGNOSIS — Z955 Presence of coronary angioplasty implant and graft: Secondary | ICD-10-CM | POA: Diagnosis not present

## 2020-07-13 NOTE — Progress Notes (Signed)
Michael [name] 54 y.o. male Nutrition Note   Visit Diagnosis: NSTEMI (non-ST elevated myocardial infarction) (HCC) 05/19/20  S/P DES RCA 05/20/20  Past Medical History:  Diagnosis Date  . (HFpEF) heart failure with preserved ejection fraction (HCC)    a. 05/2020 Echo: EF 60%, no rwma, Gr2 DD. Triv MR.  Marland Kitchen CAD (coronary artery disease)    a. 04/2020 NSTEMI/PCI: LM nl, LAD nl, LCX nl, RCA 66m, 85d (3.6x26 Resolute Onyx DES), RPDA nl, RPAV nl, EF 50-55%.  Marland Kitchen Hyperlipidemia LDL goal <70 08/28/2018  . Obesity hypoventilation syndrome (HCC)   . Primary hypertension 08/28/2018  . Sleep apnea   . Sleep apnea with use of continuous positive airway pressure (CPAP) 08/27/2013  . Viral meningitis      Medications reviewed.   Current Outpatient Medications:  .  aspirin EC 81 MG EC tablet, Take 1 tablet (81 mg total) by mouth daily. Swallow whole., Disp: 30 tablet, Rfl: 6 .  atorvastatin (LIPITOR) 80 MG tablet, Take 1 tablet (80 mg total) by mouth at bedtime., Disp: 30 tablet, Rfl: 6 .  chlorthalidone (HYGROTON) 25 MG tablet, Take 1 tablet (25 mg total) by mouth daily., Disp: 30 tablet, Rfl: 2 .  folic acid (FOLVITE) 1 MG tablet, Take 1 mg by mouth daily., Disp: , Rfl:  .  furosemide (LASIX) 20 MG tablet, Take 1 tablet (20 mg total) by mouth daily as needed for fluid (Leg swelling)., Disp: 30 tablet, Rfl: 6 .  methotrexate (RHEUMATREX) 2.5 MG tablet, Take 20 mg by mouth once a week. Tuesday, Disp: , Rfl:  .  metoprolol tartrate (LOPRESSOR) 25 MG tablet, Take 1 tablet (25 mg total) by mouth 2 (two) times daily., Disp: 60 tablet, Rfl: 6 .  Multiple Vitamin (MULTIVITAMIN) capsule, Take 1 capsule by mouth daily., Disp: , Rfl:  .  nitroGLYCERIN (NITROSTAT) 0.4 MG SL tablet, Place 1 tablet (0.4 mg total) under the tongue every 5 (five) minutes x 3 doses as needed for chest pain., Disp: 25 tablet, Rfl: 3 .  prasugrel (EFFIENT) 10 MG TABS tablet, Take 1 tablet (10 mg total) by mouth daily., Disp: 30 tablet,  Rfl: 6 .  terbinafine (LAMISIL) 250 MG tablet, Take 1 tablet (250 mg total) by mouth daily., Disp: 90 tablet, Rfl: 0 .  valsartan (DIOVAN) 160 MG tablet, Take 1 tablet (160 mg total) by mouth daily., Disp: 90 tablet, Rfl: 3   Ht Readings from Last 1 Encounters:  07/04/20 5\' 9"  (1.753 m)     Wt Readings from Last 3 Encounters:  07/04/20 (!) 330 lb (149.7 kg)  06/30/20 (!) 329 lb 12.9 oz (149.6 kg)  06/30/20 (!) 330 lb (149.7 kg)     There is no height or weight on file to calculate BMI.   Social History   Tobacco Use  Smoking Status Never Smoker  Smokeless Tobacco Never Used     Lab Results  Component Value Date   CHOL 229 (H) 05/20/2020   Lab Results  Component Value Date   HDL 35 (L) 05/20/2020   Lab Results  Component Value Date   LDLCALC 163 (H) 05/20/2020   Lab Results  Component Value Date   TRIG 155 (H) 05/20/2020     Lab Results  Component Value Date   HGBA1C 5.3 05/19/2020     CBG (last 3)  No results for input(s): GLUCAP in the last 72 hours.   Nutrition Note  Spoke with pt. Nutrition Plan and Nutrition Survey goals reviewed with pt.  Pt is following a Heart Healthy diet.  He started making changes after MI.  He spoke of weight loss and a goal weight of 220 lbs. Weight cycling present since 1994. He loses 100 lbs by staying on a very low calorie, strict diet and exercising. He is interested in learning how to create sustainable healthy habits.  Paying attention to sodium but confused at what a heart healthy diet includes.  Sleep habits - wears CPAP, sleeps 5 hrs per night, has been working on this for a while Stress - works multiple jobs, >>40 hours per week, high stress from jobs Pt expressed understanding of the information reviewed.   Nutrition Diagnosis Food-and nutrition-related knowledge deficit related to lack of exposure to information as related to diagnosis of: ? CVD ?  ? Obese  III = 40+ related to excessive energy intake as evidenced  by a BMI 48.73  Nutrition Intervention ? Pt's individual nutrition plan reviewed with pt. ? Benefits of adopting Heart Healthy diet discussed when Medficts reviewed.   ? Continue client-centered nutrition education by RD, as part of interdisciplinary care.  Goal(s) ? Pt to identify food quantities necessary to achieve weight loss of 6-24 lb at graduation from cardiac rehab.  ? Pt to build a healthy plate including vegetables, fruits, whole grains, and low-fat dairy products in a heart healthy meal plan.  Plan:    Will provide client-centered nutrition education as part of interdisciplinary care  Monitor and evaluate progress toward nutrition goal with team.   Andrey Campanile, MS, RDN, LDN

## 2020-07-15 ENCOUNTER — Encounter (HOSPITAL_COMMUNITY)
Admission: RE | Admit: 2020-07-15 | Discharge: 2020-07-15 | Disposition: A | Discharge status: Home / Self Care | Payer: BC Managed Care – PPO | Source: Ambulatory Visit | Attending: Cardiology | Admitting: Cardiology

## 2020-07-15 ENCOUNTER — Other Ambulatory Visit: Payer: Self-pay

## 2020-07-15 DIAGNOSIS — I214 Non-ST elevation (NSTEMI) myocardial infarction: Secondary | ICD-10-CM | POA: Diagnosis not present

## 2020-07-15 DIAGNOSIS — Z955 Presence of coronary angioplasty implant and graft: Secondary | ICD-10-CM | POA: Diagnosis not present

## 2020-07-15 NOTE — Progress Notes (Signed)
Nutrition Note - follow up   Spoke with patient today about lipid management. Reviewed lipid panel. Discussed diet changes for out of range values. Reviewed heart healthy diet. Daily recommendation of 28-40 g fiber, <16 g saturated fat, 0 g trans fat, and 1500-2000 mg sodium. Recommended eating pattern of lean protein at every meal or snack, 2 servings fatty fish per week, 3-5 servings non starchy vegetables per day, 2-3 servings fruit per day, 1 oz nuts per day. Provided handouts and recipes. Collaborated with pt for goal setting. Pt verbalized understanding.    Nutrition Diagnosis  Food-and nutrition-related knowledge deficit related to lack of exposure to information as related to diagnosis of: ? CVD ?   Obese  III = 40+ related to excessive energy intake as evidenced by a BMI 48.73  Nutrition Intervention   Pt's individual nutrition plan reviewed with pt.  Benefits of adopting Heart Healthy diet discussed when Medficts reviewed.                  Continue client-centered nutrition education by RD, as part of interdisciplinary care.  Goal(s)  Pt to identify food quantities necessary to achieve weight loss of 6-24 lb at graduation from cardiac rehab.   Pt to build a healthy plate including vegetables, fruits, whole grains, and low-fat dairy products in a heart healthy meal plan.  Plan:   Will provide client-centered nutrition education as part of interdisciplinary care  Monitor and evaluate progress toward nutrition goal with team.   Andrey Campanile, MS, RDN, LDN

## 2020-07-18 ENCOUNTER — Encounter (HOSPITAL_COMMUNITY)
Admission: RE | Admit: 2020-07-18 | Discharge: 2020-07-18 | Disposition: A | Discharge status: Home / Self Care | Payer: BC Managed Care – PPO | Source: Ambulatory Visit | Attending: Cardiology | Admitting: Cardiology

## 2020-07-18 ENCOUNTER — Other Ambulatory Visit: Payer: Self-pay

## 2020-07-18 DIAGNOSIS — Z955 Presence of coronary angioplasty implant and graft: Secondary | ICD-10-CM | POA: Diagnosis not present

## 2020-07-18 DIAGNOSIS — I214 Non-ST elevation (NSTEMI) myocardial infarction: Secondary | ICD-10-CM | POA: Diagnosis not present

## 2020-07-20 ENCOUNTER — Encounter (HOSPITAL_COMMUNITY)
Admission: RE | Admit: 2020-07-20 | Discharge: 2020-07-20 | Disposition: A | Discharge status: Home / Self Care | Payer: BC Managed Care – PPO | Source: Ambulatory Visit | Attending: Cardiology | Admitting: Cardiology

## 2020-07-20 ENCOUNTER — Other Ambulatory Visit: Payer: Self-pay

## 2020-07-20 DIAGNOSIS — I214 Non-ST elevation (NSTEMI) myocardial infarction: Secondary | ICD-10-CM | POA: Insufficient documentation

## 2020-07-20 DIAGNOSIS — Z955 Presence of coronary angioplasty implant and graft: Secondary | ICD-10-CM | POA: Diagnosis not present

## 2020-07-22 ENCOUNTER — Other Ambulatory Visit: Payer: Self-pay

## 2020-07-22 ENCOUNTER — Encounter (HOSPITAL_COMMUNITY)
Admission: RE | Admit: 2020-07-22 | Discharge: 2020-07-22 | Disposition: A | Discharge status: Home / Self Care | Payer: BC Managed Care – PPO | Source: Ambulatory Visit | Attending: Cardiology | Admitting: Cardiology

## 2020-07-22 VITALS — Wt 330.0 lb

## 2020-07-22 DIAGNOSIS — Z955 Presence of coronary angioplasty implant and graft: Secondary | ICD-10-CM

## 2020-07-22 DIAGNOSIS — I214 Non-ST elevation (NSTEMI) myocardial infarction: Secondary | ICD-10-CM | POA: Diagnosis not present

## 2020-07-27 ENCOUNTER — Other Ambulatory Visit: Payer: Self-pay

## 2020-07-27 ENCOUNTER — Encounter (HOSPITAL_COMMUNITY)
Admission: RE | Admit: 2020-07-27 | Discharge: 2020-07-27 | Disposition: A | Discharge status: Home / Self Care | Payer: BC Managed Care – PPO | Source: Ambulatory Visit | Attending: Cardiology | Admitting: Cardiology

## 2020-07-27 DIAGNOSIS — I214 Non-ST elevation (NSTEMI) myocardial infarction: Secondary | ICD-10-CM | POA: Diagnosis not present

## 2020-07-27 DIAGNOSIS — Z955 Presence of coronary angioplasty implant and graft: Secondary | ICD-10-CM | POA: Diagnosis not present

## 2020-07-27 NOTE — Progress Notes (Signed)
Cardiac Individual Treatment Plan  Patient Details  Name: Michael [name] MRN: [id number] Date of Birth: 03-12-1966 Referring Provider:     CARDIAC REHAB PHASE II ORIENTATION from 06/30/2020 in [facility] CARDIAC REHAB  Referring Provider Yates Decamp, MD      Initial Encounter Date:    CARDIAC REHAB PHASE II ORIENTATION from 06/30/2020 in [facility] CARDIAC REHAB  Date 06/30/20      Visit Diagnosis: NSTEMI (non-ST elevated myocardial infarction) (HCC) 05/19/20  S/P DES RCA 05/20/20  Patient's Home Medications on Admission:  Current Outpatient Medications:  .  aspirin EC 81 MG EC tablet, Take 1 tablet (81 mg total) by mouth daily. Swallow whole., Disp: 30 tablet, Rfl: 6 .  atorvastatin (LIPITOR) 80 MG tablet, Take 1 tablet (80 mg total) by mouth at bedtime., Disp: 30 tablet, Rfl: 6 .  chlorthalidone (HYGROTON) 25 MG tablet, Take 1 tablet (25 mg total) by mouth daily., Disp: 30 tablet, Rfl: 2 .  folic acid (FOLVITE) 1 MG tablet, Take 1 mg by mouth daily., Disp: , Rfl:  .  furosemide (LASIX) 20 MG tablet, Take 1 tablet (20 mg total) by mouth daily as needed for fluid (Leg swelling)., Disp: 30 tablet, Rfl: 6 .  methotrexate (RHEUMATREX) 2.5 MG tablet, Take 20 mg by mouth once a week. Tuesday, Disp: , Rfl:  .  metoprolol tartrate (LOPRESSOR) 25 MG tablet, Take 1 tablet (25 mg total) by mouth 2 (two) times daily., Disp: 60 tablet, Rfl: 6 .  Multiple Vitamin (MULTIVITAMIN) capsule, Take 1 capsule by mouth daily., Disp: , Rfl:  .  nitroGLYCERIN (NITROSTAT) 0.4 MG SL tablet, Place 1 tablet (0.4 mg total) under the tongue every 5 (five) minutes x 3 doses as needed for chest pain., Disp: 25 tablet, Rfl: 3 .  prasugrel (EFFIENT) 10 MG TABS tablet, Take 1 tablet (10 mg total) by mouth daily., Disp: 30 tablet, Rfl: 6 .  terbinafine (LAMISIL) 250 MG tablet, Take 1 tablet (250 mg total) by mouth daily., Disp: 90 tablet, Rfl: 0 .  valsartan (DIOVAN) 160 MG tablet, Take 1  tablet (160 mg total) by mouth daily., Disp: 90 tablet, Rfl: 3  Past Medical History: Past Medical History:  Diagnosis Date  . (HFpEF) heart failure with preserved ejection fraction (HCC)    a. 05/2020 Echo: EF 60%, no rwma, Gr2 DD. Triv MR.  Marland Kitchen CAD (coronary artery disease)    a. 04/2020 NSTEMI/PCI: LM nl, LAD nl, LCX nl, RCA 61m, 85d (3.6x26 Resolute Onyx DES), RPDA nl, RPAV nl, EF 50-55%.  Marland Kitchen Hyperlipidemia LDL goal <70 08/28/2018  . Obesity hypoventilation syndrome (HCC)   . Primary hypertension 08/28/2018  . Sleep apnea   . Sleep apnea with use of continuous positive airway pressure (CPAP) 08/27/2013  . Viral meningitis     Tobacco Use: Social History   Tobacco Use  Smoking Status Never Smoker  Smokeless Tobacco Never Used    Labs: Recent Review Advice worker    Labs for ITP Cardiac and Pulmonary Rehab Latest Ref Rng & Units 05/19/2020 05/20/2020   Cholestrol 0 - 200 mg/dL - 794(I)   LDLCALC 0 - 99 mg/dL - 016(P)   HDL >53 mg/dL - 74(M)   Trlycerides <270 mg/dL - 786(L)   Hemoglobin J4G 4.8 - 5.6 % 5.3 -      Capillary Blood Glucose: No results found for: GLUCAP   Exercise Target Goals: Exercise Program Goal: Individual exercise prescription set using results from initial 6 min  walk test and THRR while considering  patient's activity barriers and safety.   Exercise Prescription Goal: Starting with aerobic activity 30 plus minutes a day, 3 days per week for initial exercise prescription. Provide home exercise prescription and guidelines that participant acknowledges understanding prior to discharge.  Activity Barriers & Risk Stratification:  Activity Barriers & Cardiac Risk Stratification - 06/30/20 1225      Activity Barriers & Cardiac Risk Stratification   Activity Barriers Back Problems;Other (comment)    Comments Pt has Dyshidrotic Eczema. This condition can cuase blisteing on the feet which can make walking uncomfotable/painful.    Cardiac Risk Stratification  High           6 Minute Walk:  6 Minute Walk    Row Name 06/30/20 0947         6 Minute Walk   Phase Initial     Distance 1721 feet     Walk Time 6 minutes     # of Rest Breaks 0     MPH 3.26     METS 3.31     RPE 12     Perceived Dyspnea  1     VO2 Peak 11.58     Symptoms Yes (comment)     Comments Mild SOB: RPD = 1     Resting HR 66 bpm     Resting BP 104/68     Resting Oxygen Saturation  95 %     Exercise Oxygen Saturation  during 6 min walk 97 %     Max Ex. HR 98 bpm     Max Ex. BP 138/64     2 Minute Post BP 130/60            Oxygen Initial Assessment:   Oxygen Re-Evaluation:   Oxygen Discharge (Final Oxygen Re-Evaluation):   Initial Exercise Prescription:  Initial Exercise Prescription - 06/30/20 1200      Date of Initial Exercise RX and Referring Provider   Date 06/30/20    Referring Provider Yates Decamp, MD    Expected Discharge Date 09/02/20      T5 Nustep   Level 3    SPM 75    Minutes 15    METs 2.5      Track   Laps 12    Minutes 15    METs 3.09      Prescription Details   Frequency (times per week) 3    Duration Progress to 30 minutes of continuous aerobic without signs/symptoms of physical distress      Intensity   THRR 40-80% of Max Heartrate 67-134    Ratings of Perceived Exertion 11-13    Perceived Dyspnea 0-4      Progression   Progression Continue progressive overload as per policy without signs/symptoms or physical distress.      Resistance Training   Training Prescription Yes    Weight 5 lbs    Reps 10-15           Perform Capillary Blood Glucose checks as needed.  Exercise Prescription Changes:   Exercise Prescription Changes    Row Name 07/11/20 1000 07/20/20 1510 07/27/20 1000         Response to Exercise   Blood Pressure (Admit) 110/70 102/62 130/80     Blood Pressure (Exercise) 116/58 130/80 118/80     Blood Pressure (Exit) 100/62 112/60 112/64     Heart Rate (Admit) 68 bpm 71 bpm 67 bpm      Heart Rate (  Exercise) 94 bpm 99 bpm 86 bpm     Heart Rate (Exit) 62 bpm 69 bpm 63 bpm     Rating of Perceived Exertion (Exercise) 11 11 11      Perceived Dyspnea (Exercise) 0 0 0     Symptoms None None None     Comments Pt's first day of exercise None Reviewed Home Exercise     Duration Progress to 30 minutes of  aerobic without signs/symptoms of physical distress Progress to 30 minutes of  aerobic without signs/symptoms of physical distress Continue with 30 min of aerobic exercise without signs/symptoms of physical distress.     Intensity THRR unchanged THRR unchanged THRR unchanged       Progression   Progression Continue to progress workloads to maintain intensity without signs/symptoms of physical distress. Continue to progress workloads to maintain intensity without signs/symptoms of physical distress. Continue to progress workloads to maintain intensity without signs/symptoms of physical distress.     Average METs 2.5 2.3 2.6       Resistance Training   Training Prescription Yes No No     Weight 5 lbs -- --     Reps 10-15 -- --     Time 10 Minutes -- --       Interval Training   Interval Training No No --       T5 Nustep   Level 3 3 3      SPM 70 80 85     Minutes 15 30 30      METs 2 2.3 2.6       Track   Laps 18 -- --     Minutes 15 -- --     METs 4 -- --       Home Exercise Plan   Plans to continue exercise at -- -- Home (comment)  Stationary Bike     Frequency -- -- Add 2 additional days to program exercise sessions.     Initial Home Exercises Provided -- -- 07/27/20            Exercise Comments:   Exercise Comments    Row Name 07/11/20 1024 07/27/20 1015         Exercise Comments Pt's first day of exercise. Pt oriented to exercise equipment. Pt responded well to workloads. Will continue to monitor and progress as tolerated. Reviewed Home Exercise Plan and METs with pt. Pt has stationary bike that he will be using for exercise at home. Pt is very active,  explained the difference between physcial activity and exercise. Will continue to monitor pt's progress.             Exercise Goals and Review:   Exercise Goals    Row Name 06/30/20 1231             Exercise Goals   Increase Physical Activity Yes       Intervention Provide advice, education, support and counseling about physical activity/exercise needs.;Develop an individualized exercise prescription for aerobic and resistive training based on initial evaluation findings, risk stratification, comorbidities and participant's personal goals.       Expected Outcomes Short Term: Attend rehab on a regular basis to increase amount of physical activity.;Long Term: Add in home exercise to make exercise part of routine and to increase amount of physical activity.;Long Term: Exercising regularly at least 3-5 days a week.       Increase Strength and Stamina Yes       Intervention Provide advice, education, support and counseling  about physical activity/exercise needs.;Develop an individualized exercise prescription for aerobic and resistive training based on initial evaluation findings, risk stratification, comorbidities and participant's personal goals.       Expected Outcomes Short Term: Increase workloads from initial exercise prescription for resistance, speed, and METs.;Short Term: Perform resistance training exercises routinely during rehab and add in resistance training at home;Long Term: Improve cardiorespiratory fitness, muscular endurance and strength as measured by increased METs and functional capacity ( )       Able to understand and use rate of perceived exertion (RPE) scale Yes       Intervention Provide education and explanation on how to use RPE scale       Expected Outcomes Short Term: Able to use RPE daily in rehab to express subjective intensity level;Long Term:  Able to use RPE to guide intensity level when exercising independently       Knowledge and understanding of Target  Heart Rate Range (THRR) Yes       Intervention Provide education and explanation of THRR including how the numbers were predicted and where they are located for reference       Expected Outcomes Short Term: Able to state/look up THRR;Long Term: Able to use THRR to govern intensity when exercising independently;Short Term: Able to use daily as guideline for intensity in rehab       Able to check pulse independently Yes       Intervention Provide education and demonstration on how to check pulse in carotid and radial arteries.;Review the importance of being able to check your own pulse for safety during independent exercise       Expected Outcomes Short Term: Able to explain why pulse checking is important during independent exercise;Long Term: Able to check pulse independently and accurately       Understanding of Exercise Prescription Yes       Intervention Provide education, explanation, and written materials on patient's individual exercise prescription       Expected Outcomes Short Term: Able to explain program exercise prescription;Long Term: Able to explain home exercise prescription to exercise independently              Exercise Goals Re-Evaluation :  Exercise Goals Re-Evaluation    Row Name 07/11/20 1025 07/27/20 1017           Exercise Goal Re-Evaluation   Exercise Goals Review Able to understand and use rate of perceived exertion (RPE) scale;Knowledge and understanding of Target Heart Rate Range (THRR);Understanding of Exercise Prescription Increase Physical Activity;Increase Strength and Stamina;Able to understand and use rate of perceived exertion (RPE) scale;Knowledge and understanding of Target Heart Rate Range (THRR);Able to check pulse independently;Understanding of Exercise Prescription      Comments Pt's first day of exercise. Pt responded well. Pt able to exercise 30 minutes with minimal difficulty. Reviewed METs with pt and Home Exercise Plan. Reviewed THRR, RPE Scale,  weather conditions, endpoints of exercise, NTG use, warmup, and cool down.      Expected Outcomes Pt will continue to increase strength and stamina. Pt will exercise 2-3 days at home 30-70minutes, using stationary bike and walking.              Discharge Exercise Prescription (Final Exercise Prescription Changes):  Exercise Prescription Changes - 07/27/20 1000      Response to Exercise   Blood Pressure (Admit) 130/80    Blood Pressure (Exercise) 118/80    Blood Pressure (Exit) 112/64    Heart Rate (Admit) 67 bpm  Heart Rate (Exercise) 86 bpm    Heart Rate (Exit) 63 bpm    Rating of Perceived Exertion (Exercise) 11    Perceived Dyspnea (Exercise) 0    Symptoms None    Comments Reviewed Home Exercise    Duration Continue with 30 min of aerobic exercise without signs/symptoms of physical distress.    Intensity THRR unchanged      Progression   Progression Continue to progress workloads to maintain intensity without signs/symptoms of physical distress.    Average METs 2.6      Resistance Training   Training Prescription No      T5 Nustep   Level 3    SPM 85    Minutes 30    METs 2.6      Home Exercise Plan   Plans to continue exercise at Home (comment)   Stationary Bike   Frequency Add 2 additional days to program exercise sessions.    Initial Home Exercises Provided 07/27/20           Nutrition:  Target Goals: Understanding of nutrition guidelines, daily intake of sodium 1500mg , cholesterol 200mg , calories 30% from fat and 7% or less from saturated fats, daily to have 5 or more servings of fruits and vegetables.  Biometrics:  Pre Biometrics - 06/30/20 0900      Pre Biometrics   Waist Circumference 62 inches    Hip Circumference 61 inches    Waist to Hip Ratio 1.02 %    Triceps Skinfold 9 mm    % Body Fat 43.6 %    Grip Strength 58.5 kg    Flexibility 9.5 in    Single Leg Stand 18.93 seconds            Nutrition Therapy Plan and Nutrition Goals:   Nutrition Therapy & Goals - 07/13/20 1025      Nutrition Therapy   Diet Heart Healthy    Drug/Food Interactions Statins/Certain Fruits      Personal Nutrition Goals   Nutrition Goal Pt to identify food quantities necessary to achieve weight loss of 6-24 lb at graduation from cardiac rehab.    Personal Goal #2 Pt to build a healthy plate including vegetables, fruits, whole grains, and low-fat dairy products in a heart healthy meal plan.      Intervention Plan   Intervention Nutrition handout(s) given to patient.;Prescribe, educate and counsel regarding individualized specific dietary modifications aiming towards targeted core components such as weight, hypertension, lipid management, diabetes, heart failure and other comorbidities.    Expected Outcomes Long Term Goal: Adherence to prescribed nutrition plan.;Short Term Goal: A plan has been developed with personal nutrition goals set during dietitian appointment.           Nutrition Assessments:  Nutrition Assessments - 07/13/20 1026      MEDFICTS Scores   Pre Score 15           Nutrition Goals Re-Evaluation:  Nutrition Goals Re-Evaluation    Row Name 07/13/20 1026 07/26/20 0954           Goals   Current Weight 330 lb (149.7 kg) 330 lb (149.7 kg)      Nutrition Goal Pt to identify food quantities necessary to achieve weight loss of 6-24 lb at graduation from cardiac rehab. --      Comment -- Reviewed heart healthy diet        Personal Goal #2 Re-Evaluation   Personal Goal #2 Pt to build a healthy plate including vegetables, fruits, whole  grains, and low-fat dairy products in a heart healthy meal plan. --             Nutrition Goals Discharge (Final Nutrition Goals Re-Evaluation):  Nutrition Goals Re-Evaluation - 07/26/20 0954      Goals   Current Weight 330 lb (149.7 kg)    Comment Reviewed heart healthy diet           Psychosocial: Target Goals: Acknowledge presence or absence of significant depression and/or  stress, maximize coping skills, provide positive support system. Participant is able to verbalize types and ability to use techniques and skills needed for reducing stress and depression.  Initial Review & Psychosocial Screening:  Initial Psych Review & Screening - 06/30/20 1409      Initial Review   Current issues with None Identified      Family Dynamics   Good Support System? Yes   Loranzo has his girlfriend for support.     Barriers   Psychosocial barriers to participate in program There are no identifiable barriers or psychosocial needs.      Screening Interventions   Interventions Encouraged to exercise;Provide feedback about the scores to participant    Expected Outcomes Long Term Goal: Stressors or current issues are controlled or eliminated.;Short Term goal: Identification and review with participant of any Quality of Life or Depression concerns found by scoring the questionnaire.;Long Term goal: The participant improves quality of Life and PHQ9 Scores as seen by post scores and/or verbalization of changes           Quality of Life Scores:  Quality of Life - 06/30/20 1219      Quality of Life   Select Quality of Life      Quality of Life Scores   Health/Function Pre 19.07 %    Socioeconomic Pre 21.94 %    Psych/Spiritual Pre 20.93 %    Family Pre 27.6 %    GLOBAL Pre 21.31 %          Scores of 19 and below usually indicate a poorer quality of life in these areas.  A difference of  2-3 points is a clinically meaningful difference.  A difference of 2-3 points in the total score of the Quality of Life Index has been associated with significant improvement in overall quality of life, self-image, physical symptoms, and general health in studies assessing change in quality of life.  PHQ-9: Recent Review Flowsheet Data    Depression screen [facility] 2/9 06/30/2020 08/31/2019 08/28/2018   Decreased Interest 0 1 0   Down, Depressed, Hopeless 0 0 0   PHQ - 2 Score 0 1 0   Altered  sleeping - 3 -   Tired, decreased energy - 3 -   Change in appetite - 1 -   Feeling bad or failure about yourself  - 0 -   Trouble concentrating - 2 -   Moving slowly or fidgety/restless - 0 -   Suicidal thoughts - 0 -   PHQ-9 Score - 10 -   Difficult doing work/chores - Extremely dIfficult -     Interpretation of Total Score  Total Score Depression Severity:  1-4 = Minimal depression, 5-9 = Mild depression, 10-14 = Moderate depression, 15-19 = Moderately severe depression, 20-27 = Severe depression   Psychosocial Evaluation and Intervention:  Psychosocial Evaluation - 07/11/20 1300      Psychosocial Evaluation & Interventions   Interventions Encouraged to exercise with the program and follow exercise prescription    Comments Mr. Livesey  presented to his first cardiac rehab exercise session with a positive attitude and outlook. He is eager to modify his risk factors and progress towards his personal goals. He has a good support system and enjoys doing home improvements, woodworking, and canoeing. No psychosocial barriers to participation in CR identified. No interventions needed.    Expected Outcomes Patient will continue to have a positive attitude and outlook.    Continue Psychosocial Services  No Follow up required           Psychosocial Re-Evaluation:  Psychosocial Re-Evaluation    Row Name 07/26/20 1607             Psychosocial Re-Evaluation   Current issues with None Identified       Interventions Encouraged to attend Cardiac Rehabilitation for the exercise       Continue Psychosocial Services  No Follow up required              Psychosocial Discharge (Final Psychosocial Re-Evaluation):  Psychosocial Re-Evaluation - 07/26/20 1607      Psychosocial Re-Evaluation   Current issues with None Identified    Interventions Encouraged to attend Cardiac Rehabilitation for the exercise    Continue Psychosocial Services  No Follow up required           Vocational  Rehabilitation: Provide vocational rehab assistance to qualifying candidates.   Vocational Rehab Evaluation & Intervention:  Vocational Rehab - 06/30/20 1410      Initial Vocational Rehab Evaluation & Intervention   Assessment shows need for Vocational Rehabilitation No   Trestin works full time and does not need vocational rehab at this time.          Education: Education Goals: Education classes will be provided on a weekly basis, covering required topics. Participant will state understanding/return demonstration of topics presented.  Learning Barriers/Preferences:  Learning Barriers/Preferences - 06/30/20 1220      Learning Barriers/Preferences   Learning Barriers None    Learning Preferences Skilled Demonstration;Individual Instruction;Group Instruction           Education Topics: Hypertension, Hypertension Reduction -Define heart disease and high blood pressure. Discus how high blood pressure affects the body and ways to reduce high blood pressure.   Exercise and Your Heart -Discuss why it is important to exercise, the FITT principles of exercise, normal and abnormal responses to exercise, and how to exercise safely.   Angina -Discuss definition of angina, causes of angina, treatment of angina, and how to decrease risk of having angina.   Cardiac Medications -Review what the following cardiac medications are used for, how they affect the body, and side effects that may occur when taking the medications.  Medications include Aspirin, Beta blockers, calcium channel blockers, ACE Inhibitors, angiotensin receptor blockers, diuretics, digoxin, and antihyperlipidemics.   Congestive Heart Failure -Discuss the definition of CHF, how to live with CHF, the signs and symptoms of CHF, and how keep track of weight and sodium intake.   Heart Disease and Intimacy -Discus the effect sexual activity has on the heart, how changes occur during intimacy as we age, and safety during  sexual activity.   Smoking Cessation / COPD -Discuss different methods to quit smoking, the health benefits of quitting smoking, and the definition of COPD.   Nutrition I: Fats -Discuss the types of cholesterol, what cholesterol does to the heart, and how cholesterol levels can be controlled.   Nutrition II: Labels -Discuss the different components of food labels and how to read food label  Heart Parts/Heart Disease and PAD -Discuss the anatomy of the heart, the pathway of blood circulation through the heart, and these are affected by heart disease.   Stress I: Signs and Symptoms -Discuss the causes of stress, how stress may lead to anxiety and depression, and ways to limit stress.   Stress II: Relaxation -Discuss different types of relaxation techniques to limit stress.   Warning Signs of Stroke / TIA -Discuss definition of a stroke, what the signs and symptoms are of a stroke, and how to identify when someone is having stroke.   Knowledge Questionnaire Score:  Knowledge Questionnaire Score - 06/30/20 1221      Knowledge Questionnaire Score   Pre Score 23/24           Core Components/Risk Factors/Patient Goals at Admission:  Personal Goals and Risk Factors at Admission - 06/30/20 1414      Core Components/Risk Factors/Patient Goals on Admission    Weight Management Yes;Obesity;Weight Loss    Intervention Weight Management: Develop a combined nutrition and exercise program designed to reach desired caloric intake, while maintaining appropriate intake of nutrient and fiber, sodium and fats, and appropriate energy expenditure required for the weight goal.;Weight Management: Provide education and appropriate resources to help participant work on and attain dietary goals.;Weight Management/Obesity: Establish reasonable short term and long term weight goals.;Obesity: Provide education and appropriate resources to help participant work on and attain dietary goals.    Admit  Weight 329 lb 12.9 oz (149.6 kg)    Goal Weight: Long Term 225 lb (102.1 kg)    Expected Outcomes Short Term: Continue to assess and modify interventions until short term weight is achieved;Long Term: Adherence to nutrition and physical activity/exercise program aimed toward attainment of established weight goal;Weight Maintenance: Understanding of the daily nutrition guidelines, which includes 25-35% calories from fat, 7% or less cal from saturated fats, less than 200mg  cholesterol, less than 1.5gm of sodium, & 5 or more servings of fruits and vegetables daily;Weight Loss: Understanding of general recommendations for a balanced deficit meal plan, which promotes 1-2 lb weight loss per week and includes a negative energy balance of [phone] kcal/d;Understanding recommendations for meals to include 15-35% energy as protein, 25-35% energy from fat, 35-60% energy from carbohydrates, less than 200mg  of dietary cholesterol, 20-35 gm of total fiber daily;Understanding of distribution of calorie intake throughout the day with the consumption of 4-5 meals/snacks    Hypertension Yes    Intervention Provide education on lifestyle modifcations including regular physical activity/exercise, weight management, moderate sodium restriction and increased consumption of fresh fruit, vegetables, and low fat dairy, alcohol moderation, and smoking cessation.;Monitor prescription use compliance.    Expected Outcomes Short Term: Continued assessment and intervention until BP is < 140/70mm HG in hypertensive participants. < 130/12mm HG in hypertensive participants with diabetes, heart failure or chronic kidney disease.;Long Term: Maintenance of blood pressure at goal levels.    Lipids Yes    Intervention Provide education and support for participant on nutrition & aerobic/resistive exercise along with prescribed medications to achieve LDL 70mg , HDL >40mg .    Expected Outcomes Short Term: Participant states understanding of desired  cholesterol values and is compliant with medications prescribed. Participant is following exercise prescription and nutrition guidelines.;Long Term: Cholesterol controlled with medications as prescribed, with individualized exercise RX and with personalized nutrition plan. Value goals: LDL < 70mg , HDL > 40 mg.    Stress Yes    Intervention Offer individual and/or small group education and counseling on adjustment to heart  disease, stress management and health-related lifestyle change. Teach and support self-help strategies.;Refer participants experiencing significant psychosocial distress to appropriate mental health specialists for further evaluation and treatment. When possible, include family members and significant others in education/counseling sessions.    Expected Outcomes Short Term: Participant demonstrates changes in health-related behavior, relaxation and other stress management skills, ability to obtain effective social support, and compliance with psychotropic medications if prescribed.;Long Term: Emotional wellbeing is indicated by absence of clinically significant psychosocial distress or social isolation.           Core Components/Risk Factors/Patient Goals Review:   Goals and Risk Factor Review    Row Name 07/11/20 1307 07/26/20 1607           Core Components/Risk Factors/Patient Goals Review   Personal Goals Review Weight Management/Obesity;Lipids;Stress;Hypertension Weight Management/Obesity;Lipids;Stress;Hypertension      Review Mr. Monteforte has multiple CAD risk factors. He is eager to participate in CR for risk factor modifications. He denies barriers to participation in CR or self health management. His goals are to loose weight, better his health, and increase his energy level. Shelton has been doing well with exercise. Jerimyah has been able to move his work loads up accordingly      Expected Outcomes Patient will continue to participate in CR for risk factor modifications.  Patient will continue to participate in CR for risk factor modifications.             Core Components/Risk Factors/Patient Goals at Discharge (Final Review):   Goals and Risk Factor Review - 07/26/20 1607      Core Components/Risk Factors/Patient Goals Review   Personal Goals Review Weight Management/Obesity;Lipids;Stress;Hypertension    Review Bernerd has been doing well with exercise. Huie has been able to move his work loads up accordingly    Expected Outcomes Patient will continue to participate in CR for risk factor modifications.           ITP Comments:  ITP Comments    Row Name 06/30/20 1403 07/11/20 1248 07/26/20 1605       ITP Comments Dr Armanda Magic MD, Medical Director Mr. Holloway completed his first cardiac rehab exercise session today and tolerated very well. He worked to an RPE of 11 while walking the track and on the NuStep. Average mets 2-3. VSS. Denied complaints. Personal goals are to begin a weight loss program, better his health, and increase his energy. His weight loss goal is to loose 105 lbs. 30 Day ITP Review. Patient with good attendance and participation in phase 2 cardiac rehab            Comments: See ITP comments.Gladstone Lighter, RN,BSN 07/28/2020 3:15 PM

## 2020-07-27 NOTE — Progress Notes (Signed)
I have reviewed a Home Exercise Prescription with [name] . Michael [name] is currently exercising at home. The patient was advised to continue to use his stationary bike or  walk 2-3 days a week for 30-45 minutes.  [name] and I discussed how to progress their exercise prescription. The patient stated that they understand the exercise prescription. We reviewed exercise guidelines, target heart rate during exercise, RPE Scale, weather conditions, NTG use, endpoints for exercise, warmup and cool down. Patient is encouraged to come to me with any questions. I will continue to follow up with the patient to assist them with progression and safety.    York Cerise MS, CEP CSCS 07/27/2020 10:10 AM

## 2020-07-29 ENCOUNTER — Other Ambulatory Visit: Payer: Self-pay

## 2020-07-29 ENCOUNTER — Encounter (HOSPITAL_COMMUNITY)
Admission: RE | Admit: 2020-07-29 | Discharge: 2020-07-29 | Disposition: A | Discharge status: Home / Self Care | Payer: BC Managed Care – PPO | Source: Ambulatory Visit | Attending: Cardiology | Admitting: Cardiology

## 2020-07-29 DIAGNOSIS — Z955 Presence of coronary angioplasty implant and graft: Secondary | ICD-10-CM | POA: Diagnosis not present

## 2020-07-29 DIAGNOSIS — I214 Non-ST elevation (NSTEMI) myocardial infarction: Secondary | ICD-10-CM

## 2020-07-29 NOTE — Progress Notes (Signed)
Nutrition Note - Follow Up  Reviewed pt progress. Reading labels - reviewed again for clarification. Answered his questions about stress, food labels, goals for a heart healthy diet. He continues to work towards making heart healthy changes. Reviewed sodium recommendation of 1500-2000 mg/day.  Pt verbalizes understanding.  Nutrition Diagnosis  Food-and nutrition-related knowledge deficit related to lack of exposure to information as related to diagnosis of: ?CVD ?  ObeseIII = 40+related to excessive energy intake as evidenced by a BMI 48.73  Nutrition Intervention   Pt's individual nutrition plan reviewed with pt.  Benefits of adopting Heart Healthy diet discussed when Med[employer]s reviewed.  Continue client-centered nutrition education by RD, as part of interdisciplinary care.  Goal(s)  Pt to identify food quantities necessary to achieve weight loss of 6-24 lb at graduation from cardiac rehab.   Pt to build a healthy plate including vegetables, fruits, whole grains, and low-fat dairy products in a heart healthy meal plan.  Plan:  Will provide client-centered nutrition education as part of interdisciplinary care  Monitor and evaluate progress toward nutrition goal with team.  Andrey Campanile, MS, RDN, LDN

## 2020-08-01 ENCOUNTER — Encounter (HOSPITAL_COMMUNITY): Payer: BC Managed Care – PPO

## 2020-08-01 ENCOUNTER — Telehealth (HOSPITAL_COMMUNITY): Payer: Self-pay | Admitting: Family Medicine

## 2020-08-01 DIAGNOSIS — L301 Dyshidrosis [pompholyx]: Secondary | ICD-10-CM | POA: Diagnosis not present

## 2020-08-01 DIAGNOSIS — Z79899 Other long term (current) drug therapy: Secondary | ICD-10-CM | POA: Diagnosis not present

## 2020-08-03 ENCOUNTER — Other Ambulatory Visit: Payer: Self-pay

## 2020-08-03 ENCOUNTER — Encounter (HOSPITAL_COMMUNITY)
Admission: RE | Admit: 2020-08-03 | Discharge: 2020-08-03 | Disposition: A | Discharge status: Home / Self Care | Payer: BC Managed Care – PPO | Source: Ambulatory Visit | Attending: Cardiology | Admitting: Cardiology

## 2020-08-03 DIAGNOSIS — I214 Non-ST elevation (NSTEMI) myocardial infarction: Secondary | ICD-10-CM | POA: Diagnosis not present

## 2020-08-03 DIAGNOSIS — Z955 Presence of coronary angioplasty implant and graft: Secondary | ICD-10-CM

## 2020-08-05 ENCOUNTER — Other Ambulatory Visit: Payer: Self-pay

## 2020-08-05 ENCOUNTER — Encounter (HOSPITAL_COMMUNITY)
Admission: RE | Admit: 2020-08-05 | Discharge: 2020-08-05 | Disposition: A | Discharge status: Home / Self Care | Payer: BC Managed Care – PPO | Source: Ambulatory Visit | Attending: Cardiology | Admitting: Cardiology

## 2020-08-05 DIAGNOSIS — N522 Drug-induced erectile dysfunction: Secondary | ICD-10-CM | POA: Diagnosis not present

## 2020-08-05 DIAGNOSIS — I214 Non-ST elevation (NSTEMI) myocardial infarction: Secondary | ICD-10-CM

## 2020-08-05 DIAGNOSIS — Z955 Presence of coronary angioplasty implant and graft: Secondary | ICD-10-CM | POA: Diagnosis not present

## 2020-08-07 ENCOUNTER — Ambulatory Visit (INDEPENDENT_AMBULATORY_CARE_PROVIDER_SITE_OTHER): Payer: BC Managed Care – PPO | Admitting: Neurology

## 2020-08-07 ENCOUNTER — Other Ambulatory Visit: Payer: Self-pay | Admitting: Cardiology

## 2020-08-07 DIAGNOSIS — G4733 Obstructive sleep apnea (adult) (pediatric): Secondary | ICD-10-CM

## 2020-08-07 DIAGNOSIS — I5030 Unspecified diastolic (congestive) heart failure: Secondary | ICD-10-CM

## 2020-08-07 DIAGNOSIS — I2510 Atherosclerotic heart disease of native coronary artery without angina pectoris: Secondary | ICD-10-CM

## 2020-08-07 DIAGNOSIS — G4731 Primary central sleep apnea: Secondary | ICD-10-CM

## 2020-08-07 DIAGNOSIS — G4730 Sleep apnea, unspecified: Secondary | ICD-10-CM

## 2020-08-07 DIAGNOSIS — E662 Morbid (severe) obesity with alveolar hypoventilation: Secondary | ICD-10-CM

## 2020-08-08 ENCOUNTER — Encounter (HOSPITAL_COMMUNITY)
Admission: RE | Admit: 2020-08-08 | Discharge: 2020-08-08 | Disposition: A | Discharge status: Home / Self Care | Payer: BC Managed Care – PPO | Source: Ambulatory Visit | Attending: Cardiology | Admitting: Cardiology

## 2020-08-08 ENCOUNTER — Other Ambulatory Visit: Payer: Self-pay

## 2020-08-08 DIAGNOSIS — Z955 Presence of coronary angioplasty implant and graft: Secondary | ICD-10-CM | POA: Diagnosis not present

## 2020-08-08 DIAGNOSIS — I214 Non-ST elevation (NSTEMI) myocardial infarction: Secondary | ICD-10-CM

## 2020-08-10 ENCOUNTER — Other Ambulatory Visit: Payer: Self-pay

## 2020-08-10 ENCOUNTER — Encounter (HOSPITAL_COMMUNITY)
Admission: RE | Admit: 2020-08-10 | Discharge: 2020-08-10 | Disposition: A | Discharge status: Home / Self Care | Payer: BC Managed Care – PPO | Source: Ambulatory Visit | Attending: Cardiology | Admitting: Cardiology

## 2020-08-10 DIAGNOSIS — Z955 Presence of coronary angioplasty implant and graft: Secondary | ICD-10-CM

## 2020-08-10 DIAGNOSIS — I214 Non-ST elevation (NSTEMI) myocardial infarction: Secondary | ICD-10-CM

## 2020-08-11 LAB — TESTOSTERONE,FREE AND TOTAL
Testosterone, Free: 6.7 pg/mL — ABNORMAL LOW (ref 7.2–24.0)
Testosterone: 189 ng/dL — ABNORMAL LOW (ref 264–916)

## 2020-08-11 NOTE — Progress Notes (Signed)
Dear Michael [name], Looks live very low levels. Can you address this?  JG

## 2020-08-12 ENCOUNTER — Other Ambulatory Visit: Payer: Self-pay

## 2020-08-12 ENCOUNTER — Encounter (HOSPITAL_COMMUNITY)
Admission: RE | Admit: 2020-08-12 | Discharge: 2020-08-12 | Disposition: A | Discharge status: Home / Self Care | Payer: BC Managed Care – PPO | Source: Ambulatory Visit | Attending: Cardiology | Admitting: Cardiology

## 2020-08-12 DIAGNOSIS — Z955 Presence of coronary angioplasty implant and graft: Secondary | ICD-10-CM | POA: Diagnosis not present

## 2020-08-12 DIAGNOSIS — I214 Non-ST elevation (NSTEMI) myocardial infarction: Secondary | ICD-10-CM | POA: Diagnosis not present

## 2020-08-15 ENCOUNTER — Other Ambulatory Visit: Payer: Self-pay

## 2020-08-15 ENCOUNTER — Encounter (HOSPITAL_COMMUNITY)
Admission: RE | Admit: 2020-08-15 | Discharge: 2020-08-15 | Disposition: A | Discharge status: Home / Self Care | Payer: BC Managed Care – PPO | Source: Ambulatory Visit | Attending: Cardiology | Admitting: Cardiology

## 2020-08-15 DIAGNOSIS — Z955 Presence of coronary angioplasty implant and graft: Secondary | ICD-10-CM

## 2020-08-15 DIAGNOSIS — I214 Non-ST elevation (NSTEMI) myocardial infarction: Secondary | ICD-10-CM | POA: Diagnosis not present

## 2020-08-15 DIAGNOSIS — Z9989 Dependence on other enabling machines and devices: Secondary | ICD-10-CM | POA: Insufficient documentation

## 2020-08-15 NOTE — Addendum Note (Signed)
Addended by: Melvyn Novas on: 08/15/2020 12:12 PM   Modules accepted: Orders

## 2020-08-15 NOTE — Procedures (Signed)
Sleep Study Report   Patient Information     First Name: Harve Spradley. Last Name: Caedmon Louque: [id number]  Birth Date: 2066-08-17 Age: 54 Gender: Male  Referring Provider: Ardith Dark, MD BMI: 49.0 (W=330 lbs, H=5' 9'')  Neck Circ.:  20 '' Epworth:  10/24   Sleep Study Information    Study Date: 08/07/20 S/H/A Version: [id number] / [id number] / 79  History:    MARKEESE BOYAJIAN is a 54 y.o. year old White or Caucasian male patient and seen upon re-referral on 06/30/2020 from Dr Jacinto Halim, MD - his cardiologist. He had last been seen here in May 2014. Chief concern :  Mr. Raffield has a BiPAP machine which he uses with supplies from online services- his last sleep study was through our office in May 2014, and he has used the same machine for the last 7 years.  At this time he complains about a dry mouth after using his BiPAP machine -also his BiPAP machine would be considered obsolete by now. KENAZ OLAFSON today is a right -handed Caucasian male has a medical history of myocardial infarction in 05-2020.(HFpEF) heart failure with preserved ejection fraction (HCC), CAD (coronary artery disease), Hyperlipidemia LDL goal <70 (08/28/2018), Obesity hypoventilation syndrome (HCC), Primary hypertension (08/28/2018), Sleep apnea with use of continuous positive airway pressure (CPAP) (08/27/2013), and Viral meningitis, at age 54.  Summary & Diagnosis:    This HST confirms the presence of severe sleep apnea at AHI of 38.9/h and with strong REM sleep dependence at AHI of 56.4/h.  Further noted were hypoxemia of 40 minutes and many, brief desaturations of oxygen. The patient's heart rate trended towards bradycardia.     Recommendations:     This constellation can only be addressed with Positive Airway Pressure therapy, either CPAP or BiPAP. Based on his need for BiPAP in the past, I would like to offer BiPAP therapy again, but I am required to try CPAP first. This will be done in an auto CPAP device trial of 30-90 days.  Settings will be 5 -16 cm water, 2 cm EPR and heated humidification a with a mask to be fitted to patient's comfort. If central apneas arise, please inform us and we will change to an attended BiPAP titration next.   Interpreting Physician: Melvyn Novas, MD            Sleep Summary  Oxygen Saturation Statistics   Start Study Time: End Study Time: Total Recording Time:          11:40:33 PM 6:26:49 AM   6 h, 46 min  Total Sleep Time % REM of Sleep Time:  5 h, 49 min  30.3    Mean: 92 Minimum: 76 Maximum: 98  Mean of Desaturations Nadirs (%):   88  Oxygen Desaturation. %:  4-9 10-20 >20 Total  Events Number Total   105  19 84.7 15.3  0 0.0  124 100.0  Oxygen Saturation: <90 <=88 <85 <80 <70  Duration (minutes): Sleep % 39.9 11.2 31.0 11.1 8.9 3.2 2.0 0.6 0.0 0.0     Respiratory Indices      Total Events REM NREM All Night  pRDI: pAHI 3%: ODI 4%: pAHIc 3%: % CSR: pAHI 4%:  247  225  124  2 0.0 153 57.6 56.4 34.8 0.6 36.2 31.2 15.6 0.3 42.7 38.9 21.4 0.4 26.4       Pulse Rate Statistics during Sleep (BPM)      Mean: 62 Minimum: 44 Maximum: 95  Indices are calculated using technically valid sleep time of 5 h, 47 min.                                                            pAHI=38.9                                                                       Mild              Moderate                    Severe                                                 5              15                    30   Body Position Statistics  Position Supine Prone Right Left Non-Supine  Sleep (min) 345.8 0.0 0.0 0.0 0.0  Sleep % 98.9 0.0 0.0 0.0 0.0  pRDI 42.7 N/A N/A N/A N/A  pAHI 3% 38.8 N/A N/A N/A N/A  ODI 4% 21.3 N/A N/A N/A N/A      Supine    Snoring Statistics Snoring Level (dB) >40 >50 >60 >70 >80 >Threshold (45)  Sleep (min) 264.3 117.9 4.4 0.0 0.0 163.2  Sleep % 75.6 33.7 1.3 0.0 0.0 46.7    Mean: 47 dB Sleep Stages Chart

## 2020-08-15 NOTE — Progress Notes (Signed)
Summary & Diagnosis:   This HST confirms the presence of severe sleep apnea at AHI of  38.9/h and with strong REM sleep dependence at AHI of 56.4/h.  Further noted were hypoxemia of 40 minutes and many, brief  desaturations of oxygen. The patient's heart rate trended towards  bradycardia.     Recommendations:    This constellation can only be addressed with Positive Airway  Pressure therapy, either CPAP or BiPAP.  Based on his need for BiPAP in the past, I would like to offer  BiPAP therapy again, but I am required to try CPAP first. This  will be done in an auto CPAP device trial of 30-90 days. Settings  will be 5 -16 cm water, 2 cm EPR and heated humidification a with  a mask to be fitted to patient's comfort. If central apneas  arise, please inform us and we will change to an attended BiPAP  titration next.   Interpreting Physician: Melvyn Novas, MD

## 2020-08-16 ENCOUNTER — Telehealth (INDEPENDENT_AMBULATORY_CARE_PROVIDER_SITE_OTHER): Payer: BC Managed Care – PPO | Admitting: Family Medicine

## 2020-08-16 ENCOUNTER — Telehealth: Payer: Self-pay | Admitting: Neurology

## 2020-08-16 ENCOUNTER — Encounter: Payer: Self-pay | Admitting: Family Medicine

## 2020-08-16 VITALS — BP 126/78 | Ht 69.0 in | Wt 327.0 lb

## 2020-08-16 DIAGNOSIS — R7989 Other specified abnormal findings of blood chemistry: Secondary | ICD-10-CM | POA: Diagnosis not present

## 2020-08-16 DIAGNOSIS — Z23 Encounter for immunization: Secondary | ICD-10-CM | POA: Diagnosis not present

## 2020-08-16 DIAGNOSIS — I25110 Atherosclerotic heart disease of native coronary artery with unstable angina pectoris: Secondary | ICD-10-CM | POA: Diagnosis not present

## 2020-08-16 NOTE — Telephone Encounter (Signed)
I called pt. I advised pt that Dr. Vickey Huger reviewed their sleep study results and found that pt severe sleep apnea. Dr. Vickey Huger recommends that pt starts auto CPAP.Pt currently is a BiPAP user but explained that per insurance will have to show he failed auto CPAP first if  I reviewed PAP compliance expectations with the pt. Pt is agreeable to starting a CPAP. I advised pt that an order will be sent to a DME, [facility] and [facility] will call the pt within about one week after they file with the pt's insurance. [facility]) will show the pt how to use the machine, fit for masks, and troubleshoot the CPAP if needed. A follow up appt was made for insurance purposes with Dr. Vickey Huger on Jan 3,2022 at 8:30 am. Pt verbalized understanding to arrive 15 minutes early and bring their CPAP. A letter with all of this information in it will be mailed to the pt as a reminder. I verified with the pt that the address we have on file is correct. Pt verbalized understanding of results. Pt had no questions at this time but was encouraged to call back if questions arise. I have sent the order to [facility]) and have received confirmation that they have received the order.

## 2020-08-16 NOTE — Assessment & Plan Note (Signed)
Doing better.  Sees cardiology.  On aspirin and statin

## 2020-08-16 NOTE — Telephone Encounter (Signed)
Pt returned phone call.  

## 2020-08-16 NOTE — Telephone Encounter (Signed)
Called the patient to discuss SSR. Pt answered but on a conference call with PCP. Pt states he will call back.

## 2020-08-16 NOTE — Progress Notes (Signed)
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 Swallow whole., Disp: 30 tablet, Rfl: 6   atorvastatin (LIPITOR) 80 MG tablet, Take 1 tablet (80 mg total) by mouth at bedtime., Disp: 30 tablet, Rfl: 6   chlorthalidone (HYGROTON) 25 MG tablet, TAKE 1 TABLET BY MOUTH EVERY DAY, Disp: 90 tablet, Rfl: 3   furosemide (LASIX) 20 MG tablet, Take 1 tablet (20 mg total) by mouth daily as needed for fluid (Leg swelling)., Disp: 30 tablet, Rfl: 6   methotrexate (RHEUMATREX) 2.5 MG tablet, Take 20 mg by mouth once a week. Tuesday, Disp: , Rfl:    metoprolol tartrate (LOPRESSOR) 25 MG tablet, Take 1 tablet (25 mg total) by mouth 2 (two) times daily., Disp: 60 tablet, Rfl: 6   Multiple Vitamin (MULTIVITAMIN) capsule, Take 1 capsule by mouth daily., Disp: , Rfl:    nitroGLYCERIN (NITROSTAT) 0.4 MG SL tablet, Place 1 tablet (0.4 mg total) under the tongue every 5 (five) minutes x 3 doses as needed for chest pain., Disp: 25 tablet, Rfl: 3   prasugrel (EFFIENT) 10 MG TABS tablet, Take 1 tablet (10 mg total) by mouth daily., Disp: 30 tablet, Rfl: 6   valsartan (DIOVAN) 160 MG tablet, Take 1 tablet (160 mg total) by mouth daily. (Patient not taking: Reported on 08/16/2020), Disp: 90 tablet, Rfl: 3  Past Medical History: Past Medical History:  Diagnosis Date   (HFpEF) heart failure  with preserved ejection fraction (HCC)    a. 05/2020 Echo: EF 60%, no rwma, Gr2 DD. Triv MR.   CAD (coronary artery disease)    a. 04/2020 NSTEMI/PCI: LM nl, LAD nl, LCX nl, RCA 59m, 85d (3.6x26 Resolute Onyx DES), RPDA nl, RPAV nl, EF 50-55%.   Hyperlipidemia LDL goal <70 08/28/2018   Obesity hypoventilation syndrome (HCC)    Primary hypertension 08/28/2018   Sleep apnea    Sleep apnea with use of continuous positive airway pressure (CPAP) 08/27/2013   Viral meningitis     Tobacco Use: Social History   Tobacco Use  Smoking Status Never Smoker  Smokeless Tobacco Never Used    Labs: Recent Review Advice worker    Labs for ITP Cardiac and Pulmonary Rehab Latest Ref Rng & Units 05/19/2020 05/20/2020   Cholestrol 0 - 200 mg/dL - 664(Q)   LDLCALC 0 - 99 mg/dL - 034(V)   HDL >42 mg/dL - 59(D)   Trlycerides <638 mg/dL - 756(E)   Hemoglobin P3I 4.8 - 5.6 % 5.3 -      Capillary Blood Glucose: No results found for: GLUCAP   Exercise Target Goals: Exercise Program Goal: Individual exercise prescription set using results from initial 6 min walk test and THRR while considering  patients activity barriers and safety.   Exercise Prescription Goal: Starting with aerobic activity 30 plus minutes a day, 3 days per week for initial exercise prescription. Provide  home exercise prescription and guidelines that participant acknowledges understanding prior to discharge.  Activity Barriers & Risk Stratification:  Activity Barriers & Cardiac Risk Stratification - 06/30/20 1225      Activity Barriers & Cardiac Risk Stratification   Activity Barriers Back Problems;Other (comment)    Comments Pt has Dyshidrotic Eczema. This condition can cuase blisteing on the feet which can make walking uncomfotable/painful.    Cardiac Risk Stratification High           6 Minute Walk:  6 Minute Walk    Row Name 06/30/20 0947         6 Minute Walk   Phase Initial     Distance 1721 feet     Walk  Time 6 minutes     # of Rest Breaks 0     MPH 3.26     METS 3.31     RPE 12     Perceived Dyspnea  1     VO2 Peak 11.58     Symptoms Yes (comment)     Comments Mild SOB: RPD = 1     Resting HR 66 bpm     Resting BP 104/68     Resting Oxygen Saturation  95 %     Exercise Oxygen Saturation  during 6 min walk 97 %     Max Ex. HR 98 bpm     Max Ex. BP 138/64     2 Minute Post BP 130/60            Oxygen Initial Assessment:   Oxygen Re-Evaluation:   Oxygen Discharge (Final Oxygen Re-Evaluation):   Initial Exercise Prescription:  Initial Exercise Prescription - 06/30/20 1200      Date of Initial Exercise RX and Referring Provider   Date 06/30/20    Referring Provider Yates Decamp, MD    Expected Discharge Date 09/02/20      T5 Nustep   Level 3    SPM 75    Minutes 15    METs 2.5      Track   Laps 12    Minutes 15    METs 3.09      Prescription Details   Frequency (times per week) 3    Duration Progress to 30 minutes of continuous aerobic without signs/symptoms of physical distress      Intensity   THRR 40-80% of Max Heartrate 67-134    Ratings of Perceived Exertion 11-13    Perceived Dyspnea 0-4      Progression   Progression Continue progressive overload as per policy without signs/symptoms or physical distress.      Resistance Training   Training Prescription Yes    Weight 5 lbs    Reps 10-15           Perform Capillary Blood Glucose checks as needed.  Exercise Prescription Changes:   Exercise Prescription Changes    Row Name 07/11/20 1000 07/20/20 1510 07/27/20 1000 08/08/20 0711 08/17/20 0703     Response to Exercise   Blood Pressure (Admit) 110/70 102/62 130/80 136/72 130/86   Blood Pressure (Exercise) 116/58 130/80 118/80 130/74 124/72   Blood Pressure (Exit) 100/62 112/60 112/64 112/60 124/78   Heart Rate (Admit) 68 bpm 71 bpm 67 bpm 62 bpm 66 bpm   Heart Rate (Exercise) 94 bpm 99 bpm 86 bpm 90 bpm 102 bpm   Heart Rate (Exit) 62 bpm 69  bpm 63 bpm 66 bpm 70 bpm   Rating of Perceived Exertion (Exercise)  11 11 11 12 11    Perceived Dyspnea (Exercise) 0 0 0 0 0   Symptoms None None None None None   Comments Pt's first day of exercise None Reviewed Home Exercise None None   Duration Progress to 30 minutes of  aerobic without signs/symptoms of physical distress Progress to 30 minutes of  aerobic without signs/symptoms of physical distress Continue with 30 min of aerobic exercise without signs/symptoms of physical distress. Continue with 30 min of aerobic exercise without signs/symptoms of physical distress. Continue with 30 min of aerobic exercise without signs/symptoms of physical distress.   Intensity THRR unchanged THRR unchanged THRR unchanged THRR unchanged THRR unchanged     Progression   Progression Continue to progress workloads to maintain intensity without signs/symptoms of physical distress. Continue to progress workloads to maintain intensity without signs/symptoms of physical distress. Continue to progress workloads to maintain intensity without signs/symptoms of physical distress. Continue to progress workloads to maintain intensity without signs/symptoms of physical distress. Continue to progress workloads to maintain intensity without signs/symptoms of physical distress.   Average METs 2.5 2.3 2.6 3.1 3.1     Resistance Training   Training Prescription Yes No No Yes No   Weight 5 lbs -- -- 5lbs --   Reps 10-15 -- -- 10-15 --   Time 10 Minutes -- -- 10 Minutes --     Interval Training   Interval Training No No -- No No     T5 Nustep   Level 3 3 3 3 4    SPM 70 80 85 90 90   Minutes 15 30 30 15 15    METs 2 2.3 2.6 2.8 2.6     Track   Laps 18 -- -- 20 22   Minutes 15 -- -- 15 15   METs 4 -- -- 3.32 3.55     Home Exercise Plan   Plans to continue exercise at -- -- Home (comment)  Stationary Bike Home (comment)  Stationary Bike Home (comment)  Stationary Bike   Frequency -- -- Add 2 additional days to program  exercise sessions. Add 2 additional days to program exercise sessions. Add 2 additional days to program exercise sessions.   Initial Home Exercises Provided -- -- 07/27/20 07/27/20 07/27/20          Exercise Comments:   Exercise Comments    Row Name 07/11/20 1024 07/27/20 1015 08/18/20 0704       Exercise Comments Pt's first day of exercise. Pt oriented to exercise equipment. Pt responded well to workloads. Will continue to monitor and progress as tolerated. Reviewed Home Exercise Plan and METs with pt. Pt has stationary bike that he will be using for exercise at home. Pt is very active, explained the difference between physcial activity and exercise. Will continue to monitor pt's progress. Pt is responding well to exercise prescription and tolerating workload increases well. Pt continues to put forth good effort with exercise. Will follow up with pt regarding plan for cardiac rehab graduation.            Exercise Goals and Review:   Exercise Goals    Row Name 06/30/20 1231             Exercise Goals   Increase Physical Activity Yes       Intervention Provide advice, education, support and counseling about physical activity/exercise needs.;Develop an individualized exercise prescription for aerobic and resistive training based on initial evaluation findings, risk stratification, comorbidities and participant's personal goals.  Expected Outcomes Short Term: Attend rehab on a regular basis to increase amount of physical activity.;Long Term: Add in home exercise to make exercise part of routine and to increase amount of physical activity.;Long Term: Exercising regularly at least 3-5 days a week.       Increase Strength and Stamina Yes       Intervention Provide advice, education, support and counseling about physical activity/exercise needs.;Develop an individualized exercise prescription for aerobic and resistive training based on initial evaluation findings, risk stratification,  comorbidities and participant's personal goals.       Expected Outcomes Short Term: Increase workloads from initial exercise prescription for resistance, speed, and METs.;Short Term: Perform resistance training exercises routinely during rehab and add in resistance training at home;Long Term: Improve cardiorespiratory fitness, muscular endurance and strength as measured by increased METs and functional capacity ( )       Able to understand and use rate of perceived exertion (RPE) scale Yes       Intervention Provide education and explanation on how to use RPE scale       Expected Outcomes Short Term: Able to use RPE daily in rehab to express subjective intensity level;Long Term:  Able to use RPE to guide intensity level when exercising independently       Knowledge and understanding of Target Heart Rate Range (THRR) Yes       Intervention Provide education and explanation of THRR including how the numbers were predicted and where they are located for reference       Expected Outcomes Short Term: Able to state/look up THRR;Long Term: Able to use THRR to govern intensity when exercising independently;Short Term: Able to use daily as guideline for intensity in rehab       Able to check pulse independently Yes       Intervention Provide education and demonstration on how to check pulse in carotid and radial arteries.;Review the importance of being able to check your own pulse for safety during independent exercise       Expected Outcomes Short Term: Able to explain why pulse checking is important during independent exercise;Long Term: Able to check pulse independently and accurately       Understanding of Exercise Prescription Yes       Intervention Provide education, explanation, and written materials on patient's individual exercise prescription       Expected Outcomes Short Term: Able to explain program exercise prescription;Long Term: Able to explain home exercise prescription to exercise  independently              Exercise Goals Re-Evaluation :  Exercise Goals Re-Evaluation    Row Name 07/11/20 1025 07/27/20 1017 08/18/20 0708         Exercise Goal Re-Evaluation   Exercise Goals Review Able to understand and use rate of perceived exertion (RPE) scale;Knowledge and understanding of Target Heart Rate Range (THRR);Understanding of Exercise Prescription Increase Physical Activity;Increase Strength and Stamina;Able to understand and use rate of perceived exertion (RPE) scale;Knowledge and understanding of Target Heart Rate Range (THRR);Able to check pulse independently;Understanding of Exercise Prescription Increase Physical Activity;Increase Strength and Stamina;Able to understand and use rate of perceived exertion (RPE) scale;Knowledge and understanding of Target Heart Rate Range (THRR);Able to check pulse independently;Understanding of Exercise Prescription     Comments Pt's first day of exercise. Pt responded well. Pt able to exercise 30 minutes with minimal difficulty. Reviewed METs with pt and Home Exercise Plan. Reviewed THRR, RPE Scale, weather conditions, endpoints of exercise, NTG  use, warmup, and cool down. Pt is responding well to workload increases. Pt is now exercising on level 4 on Nustep. Pt states he is feeling stronger since starting program.     Expected Outcomes Pt will continue to increase strength and stamina. Pt will exercise 2-3 days at home 30-84minutes, using stationary bike and walking. Pt will continue to  exercise 2-3 days at home 30-14minutes, using stationary bike and walking.             Discharge Exercise Prescription (Final Exercise Prescription Changes):  Exercise Prescription Changes - 08/17/20 0703      Response to Exercise   Blood Pressure (Admit) 130/86    Blood Pressure (Exercise) 124/72    Blood Pressure (Exit) 124/78    Heart Rate (Admit) 66 bpm    Heart Rate (Exercise) 102 bpm    Heart Rate (Exit) 70 bpm    Rating of Perceived  Exertion (Exercise) 11    Perceived Dyspnea (Exercise) 0    Symptoms None    Comments None    Duration Continue with 30 min of aerobic exercise without signs/symptoms of physical distress.    Intensity THRR unchanged      Progression   Progression Continue to progress workloads to maintain intensity without signs/symptoms of physical distress.    Average METs 3.1      Resistance Training   Training Prescription No      Interval Training   Interval Training No      T5 Nustep   Level 4    SPM 90    Minutes 15    METs 2.6      Track   Laps 22    Minutes 15    METs 3.55      Home Exercise Plan   Plans to continue exercise at Home (comment)   Stationary Bike   Frequency Add 2 additional days to program exercise sessions.    Initial Home Exercises Provided 07/27/20           Nutrition:  Target Goals: Understanding of nutrition guidelines, daily intake of sodium 1500mg , cholesterol 200mg , calories 30% from fat and 7% or less from saturated fats, daily to have 5 or more servings of fruits and vegetables.  Biometrics:  Pre Biometrics - 06/30/20 0900      Pre Biometrics   Waist Circumference 62 inches    Hip Circumference 61 inches    Waist to Hip Ratio 1.02 %    Triceps Skinfold 9 mm    % Body Fat 43.6 %    Grip Strength 58.5 kg    Flexibility 9.5 in    Single Leg Stand 18.93 seconds            Nutrition Therapy Plan and Nutrition Goals:  Nutrition Therapy & Goals - 07/13/20 1025      Nutrition Therapy   Diet Heart Healthy    Drug/Food Interactions Statins/Certain Fruits      Personal Nutrition Goals   Nutrition Goal Pt to identify food quantities necessary to achieve weight loss of 6-24 lb at graduation from cardiac rehab.    Personal Goal #2 Pt to build a healthy plate including vegetables, fruits, whole grains, and low-fat dairy products in a heart healthy meal plan.      Intervention Plan   Intervention Nutrition handout(s) given to  patient.;Prescribe, educate and counsel regarding individualized specific dietary modifications aiming towards targeted core components such as weight, hypertension, lipid management, diabetes, heart failure and other comorbidities.  Expected Outcomes Long Term Goal: Adherence to prescribed nutrition plan.;Short Term Goal: A plan has been developed with personal nutrition goals set during dietitian appointment.           Nutrition Assessments:  Nutrition Assessments - 07/13/20 1026      MEDFICTS Scores   Pre Score 15           Nutrition Goals Re-Evaluation:  Nutrition Goals Re-Evaluation    Row Name 07/13/20 1026 07/26/20 0954 08/16/20 0853         Goals   Current Weight 330 lb (149.7 kg) 330 lb (149.7 kg) 324 lb 11.8 oz (147.3 kg)     Nutrition Goal Pt to identify food quantities necessary to achieve weight loss of 6-24 lb at graduation from cardiac rehab. -- Pt to identify food quantities necessary to achieve weight loss of 6-24 lb at graduation from cardiac rehab.     Comment -- Reviewed heart healthy diet Weight loss 6 lbs in 3 weeks       Personal Goal #2 Re-Evaluation   Personal Goal #2 Pt to build a healthy plate including vegetables, fruits, whole grains, and low-fat dairy products in a heart healthy meal plan. -- Pt to build a healthy plate including vegetables, fruits, whole grains, and low-fat dairy products in a heart healthy meal plan.            Nutrition Goals Discharge (Final Nutrition Goals Re-Evaluation):  Nutrition Goals Re-Evaluation - 08/16/20 0853      Goals   Current Weight 324 lb 11.8 oz (147.3 kg)    Nutrition Goal Pt to identify food quantities necessary to achieve weight loss of 6-24 lb at graduation from cardiac rehab.    Comment Weight loss 6 lbs in 3 weeks      Personal Goal #2 Re-Evaluation   Personal Goal #2 Pt to build a healthy plate including vegetables, fruits, whole grains, and low-fat dairy products in a heart healthy meal plan.            Psychosocial: Target Goals: Acknowledge presence or absence of significant depression and/or stress, maximize coping skills, provide positive support system. Participant is able to verbalize types and ability to use techniques and skills needed for reducing stress and depression.  Initial Review & Psychosocial Screening:  Initial Psych Review & Screening - 06/30/20 1409      Initial Review   Current issues with None Identified      Family Dynamics   Good Support System? Yes   Carmon has his girlfriend for support.     Barriers   Psychosocial barriers to participate in program There are no identifiable barriers or psychosocial needs.      Screening Interventions   Interventions Encouraged to exercise;Provide feedback about the scores to participant    Expected Outcomes Long Term Goal: Stressors or current issues are controlled or eliminated.;Short Term goal: Identification and review with participant of any Quality of Life or Depression concerns found by scoring the questionnaire.;Long Term goal: The participant improves quality of Life and PHQ9 Scores as seen by post scores and/or verbalization of changes           Quality of Life Scores:  Quality of Life - 06/30/20 1219      Quality of Life   Select Quality of Life      Quality of Life Scores   Health/Function Pre 19.07 %    Socioeconomic Pre 21.94 %    Psych/Spiritual Pre 20.93 %  Family Pre 27.6 %    GLOBAL Pre 21.31 %          Scores of 19 and below usually indicate a poorer quality of life in these areas.  A difference of  2-3 points is a clinically meaningful difference.  A difference of 2-3 points in the total score of the Quality of Life Index has been associated with significant improvement in overall quality of life, self-image, physical symptoms, and general health in studies assessing change in quality of life.  PHQ-9: Recent Review Flowsheet Data    Depression screen [facility]/9 06/30/2020 08/31/2019  08/28/2018   Decreased Interest 0 1 0   Down, Depressed, Hopeless 0 0 0   PHQ - 2 Score 0 1 0   Altered sleeping - 3 -   Tired, decreased energy - 3 -   Change in appetite - 1 -   Feeling bad or failure about yourself  - 0 -   Trouble concentrating - 2 -   Moving slowly or fidgety/restless - 0 -   Suicidal thoughts - 0 -   PHQ-9 Score - 10 -   Difficult doing work/chores - Extremely dIfficult -     Interpretation of Total Score  Total Score Depression Severity:  1-4 = Minimal depression, 5-9 = Mild depression, 10-14 = Moderate depression, 15-19 = Moderately severe depression, 20-27 = Severe depression   Psychosocial Evaluation and Intervention:  Psychosocial Evaluation - 07/11/20 1300      Psychosocial Evaluation & Interventions   Interventions Encouraged to exercise with the program and follow exercise prescription    Comments Mr. Rayvon CharMcMahon presented to his first cardiac rehab exercise session with a positive attitude and outlook. He is eager to modify his risk factors and progress towards his personal goals. He has a good support system and enjoys doing home improvements, woodworking, and canoeing. No psychosocial barriers to participation in CR identified. No interventions needed.    Expected Outcomes Patient will continue to have a positive attitude and outlook.    Continue Psychosocial Services  No Follow up required           Psychosocial Re-Evaluation:  Psychosocial Re-Evaluation    Row Name 07/26/20 1607 08/16/20 1238           Psychosocial Re-Evaluation   Current issues with None Identified None Identified      Interventions Encouraged to attend Cardiac Rehabilitation for the exercise Encouraged to attend Cardiac Rehabilitation for the exercise      Continue Psychosocial Services  No Follow up required No Follow up required             Psychosocial Discharge (Final Psychosocial Re-Evaluation):  Psychosocial Re-Evaluation - 08/16/20 1238      Psychosocial  Re-Evaluation   Current issues with None Identified    Interventions Encouraged to attend Cardiac Rehabilitation for the exercise    Continue Psychosocial Services  No Follow up required           Vocational Rehabilitation: Provide vocational rehab assistance to qualifying candidates.   Vocational Rehab Evaluation & Intervention:  Vocational Rehab - 06/30/20 1410      Initial Vocational Rehab Evaluation & Intervention   Assessment shows need for Vocational Rehabilitation No   [employer] works full time and does not need vocational rehab at this time.          Education: Education Goals: Education classes will be provided on a weekly basis, covering required topics. Participant will state understanding/return demonstration of topics  presented.  Learning Barriers/Preferences:  Learning Barriers/Preferences - 06/30/20 1220      Learning Barriers/Preferences   Learning Barriers None    Learning Preferences Skilled Demonstration;Individual Instruction;Group Instruction           Education Topics: Hypertension, Hypertension Reduction -Define heart disease and high blood pressure. Discus how high blood pressure affects the body and ways to reduce high blood pressure.   Exercise and Your Heart -Discuss why it is important to exercise, the FITT principles of exercise, normal and abnormal responses to exercise, and how to exercise safely.   Angina -Discuss definition of angina, causes of angina, treatment of angina, and how to decrease risk of having angina.   Cardiac Medications -Review what the following cardiac medications are used for, how they affect the body, and side effects that may occur when taking the medications.  Medications include Aspirin, Beta blockers, calcium channel blockers, ACE Inhibitors, angiotensin receptor blockers, diuretics, digoxin, and antihyperlipidemics.   Congestive Heart Failure -Discuss the definition of CHF, how to live with CHF, the signs and  symptoms of CHF, and how keep track of weight and sodium intake.   Heart Disease and Intimacy -Discus the effect sexual activity has on the heart, how changes occur during intimacy as we age, and safety during sexual activity.   Smoking Cessation / COPD -Discuss different methods to quit smoking, the health benefits of quitting smoking, and the definition of COPD.   Nutrition I: Fats -Discuss the types of cholesterol, what cholesterol does to the heart, and how cholesterol levels can be controlled.   Nutrition II: Labels -Discuss the different components of food labels and how to read food label   Heart Parts/Heart Disease and PAD -Discuss the anatomy of the heart, the pathway of blood circulation through the heart, and these are affected by heart disease.   Stress I: Signs and Symptoms -Discuss the causes of stress, how stress may lead to anxiety and depression, and ways to limit stress.   Stress II: Relaxation -Discuss different types of relaxation techniques to limit stress.   Warning Signs of Stroke / TIA -Discuss definition of a stroke, what the signs and symptoms are of a stroke, and how to identify when someone is having stroke.   Knowledge Questionnaire Score:  Knowledge Questionnaire Score - 06/30/20 1221      Knowledge Questionnaire Score   Pre Score 23/24           Core Components/Risk Factors/Patient Goals at Admission:  Personal Goals and Risk Factors at Admission - 06/30/20 1414      Core Components/Risk Factors/Patient Goals on Admission    Weight Management Yes;Obesity;Weight Loss    Intervention Weight Management: Develop a combined nutrition and exercise program designed to reach desired caloric intake, while maintaining appropriate intake of nutrient and fiber, sodium and fats, and appropriate energy expenditure required for the weight goal.;Weight Management: Provide education and appropriate resources to help participant work on and attain  dietary goals.;Weight Management/Obesity: Establish reasonable short term and long term weight goals.;Obesity: Provide education and appropriate resources to help participant work on and attain dietary goals.    Admit Weight 329 lb 12.9 oz (149.6 kg)    Goal Weight: Long Term 225 lb (102.1 kg)    Expected Outcomes Short Term: Continue to assess and modify interventions until short term weight is achieved;Long Term: Adherence to nutrition and physical activity/exercise program aimed toward attainment of established weight goal;Weight Maintenance: Understanding of the daily nutrition guidelines, which includes 25-35%  calories from fat, 7% or less cal from saturated fats, less than 200mg  cholesterol, less than 1.5gm of sodium, & 5 or more servings of fruits and vegetables daily;Weight Loss: Understanding of general recommendations for a balanced deficit meal plan, which promotes 1-2 lb weight loss per week and includes a negative energy balance of [phone] kcal/d;Understanding recommendations for meals to include 15-35% energy as protein, 25-35% energy from fat, 35-60% energy from carbohydrates, less than 200mg  of dietary cholesterol, 20-35 gm of total fiber daily;Understanding of distribution of calorie intake throughout the day with the consumption of 4-5 meals/snacks    Hypertension Yes    Intervention Provide education on lifestyle modifcations including regular physical activity/exercise, weight management, moderate sodium restriction and increased consumption of fresh fruit, vegetables, and low fat dairy, alcohol moderation, and smoking cessation.;Monitor prescription use compliance.    Expected Outcomes Short Term: Continued assessment and intervention until BP is < 140/33mm HG in hypertensive participants. < 130/82mm HG in hypertensive participants with diabetes, heart failure or chronic kidney disease.;Long Term: Maintenance of blood pressure at goal levels.    Lipids Yes    Intervention Provide  education and support for participant on nutrition & aerobic/resistive exercise along with prescribed medications to achieve LDL 70mg , HDL >40mg .    Expected Outcomes Short Term: Participant states understanding of desired cholesterol values and is compliant with medications prescribed. Participant is following exercise prescription and nutrition guidelines.;Long Term: Cholesterol controlled with medications as prescribed, with individualized exercise RX and with personalized nutrition plan. Value goals: LDL < 70mg , HDL > 40 mg.    Stress Yes    Intervention Offer individual and/or small group education and counseling on adjustment to heart disease, stress management and health-related lifestyle change. Teach and support self-help strategies.;Refer participants experiencing significant psychosocial distress to appropriate mental health specialists for further evaluation and treatment. When possible, include family members and significant others in education/counseling sessions.    Expected Outcomes Short Term: Participant demonstrates changes in health-related behavior, relaxation and other stress management skills, ability to obtain effective social support, and compliance with psychotropic medications if prescribed.;Long Term: Emotional wellbeing is indicated by absence of clinically significant psychosocial distress or social isolation.           Core Components/Risk Factors/Patient Goals Review:   Goals and Risk Factor Review    Row Name 07/11/20 1307 07/26/20 1607 08/16/20 1239 08/16/20 1244       Core Components/Risk Factors/Patient Goals Review   Personal Goals Review Weight Management/Obesity;Lipids;Stress;Hypertension Weight Management/Obesity;Lipids;Stress;Hypertension Weight Management/Obesity;Lipids;Stress;Hypertension Lipids    Review Mr. Wittmeyer has multiple CAD risk factors. He is eager to participate in CR for risk factor modifications. He denies barriers to participation in CR or  self health management. His goals are to loose weight, better his health, and increase his energy level. Dillin has been doing well with exercise. Tomothy has been able to move his work loads up accordingly Young has been doing well with exercise. Nhat has been able to move his work loads up accordingly --    Expected Outcomes Patient will continue to participate in CR for risk factor modifications. Patient will continue to participate in CR for risk factor modifications. Patient will continue to participate in CR for risk factor modifications. --           Core Components/Risk Factors/Patient Goals at Discharge (Final Review):   Goals and Risk Factor Review - 08/16/20 1244      Core Components/Risk Factors/Patient Goals Review   Personal Goals Review Lipids  ITP Comments:  ITP Comments    Row Name 06/30/20 1403 07/11/20 1248 07/26/20 1605 08/16/20 1238     ITP Comments Dr Armanda Magic MD, Medical Director Mr. Novosel completed his first cardiac rehab exercise session today and tolerated very well. He worked to an RPE of 11 while walking the track and on the NuStep. Average mets 2-3. VSS. Denied complaints. Personal goals are to begin a weight loss program, better his health, and increase his energy. His weight loss goal is to loose 105 lbs. 30 Day ITP Review. Patient with good attendance and participation in phase 2 cardiac rehab 30 Day ITP Review. Patient with good attendance and participation in phase 2 cardiac rehab           Comments: See ITP comments.Gladstone Lighter, RN,BSN 08/18/2020 10:23 AM

## 2020-08-16 NOTE — Telephone Encounter (Signed)
-----   Message from Melvyn Novas, MD sent at 08/15/2020 12:12 PM EDT ----- Summary & Diagnosis:   This HST confirms the presence of severe sleep apnea at AHI of  38.9/h and with strong REM sleep dependence at AHI of 56.4/h.  Further noted were hypoxemia of 40 minutes and many, brief  desaturations of oxygen. The patient's heart rate trended towards  bradycardia.     Recommendations:    This constellation can only be addressed with Positive Airway  Pressure therapy, either CPAP or BiPAP.  Based on his need for BiPAP in the past, I would like to offer  BiPAP therapy again, but I am required to try CPAP first. This  will be done in an auto CPAP device trial of 30-90 days. Settings  will be 5 -16 cm water, 2 cm EPR and heated humidification a with  a mask to be fitted to patient's comfort. If central apneas  arise, please inform us and we will change to an attended BiPAP  titration next.   Interpreting Physician: Melvyn Novas, MD

## 2020-08-16 NOTE — Assessment & Plan Note (Signed)
Discussed risk/benefits of replacement.  He will come back to recheck testosterone level and will start replacement.  Will likely try AndroGel first depending on insurance approval.

## 2020-08-16 NOTE — Progress Notes (Signed)
   Michael [name] is a 54 y.o. male who presents today for a virtual office visit.  Assessment/Plan:  Chronic Problems Addressed Today: Low testosterone Discussed risk/benefits of replacement.  He will come back to recheck testosterone level and will start replacement.  Will likely try AndroGel first depending on insurance approval.  CAD (coronary artery disease) s/p NSTEMI 2021 Doing better.  Sees cardiology.  On aspirin and statin     Subjective:  HPI:  Patient here to discuss low testosterone levels. He is last seen about a year ago. Since her last visit he unfortunately sample today stating about 3 months ago. He has been following with cardiology. He has been having ongoing issues with fatigue. As part of his work-up at testosterone levels checked recently which were low. He is also undergoing evaluation for OSA and has results on sleep study pending.       Objective/Observations  Physical Exam: Gen: NAD, resting comfortably Pulm: Normal work of breathing Neuro: Grossly normal, moves all extremities Psych: Normal affect and thought content  Virtual Visit via Video   I connected with Michael [name] on 08/16/20 at 11:40 AM EDT by a video enabled telemedicine application and verified that I am speaking with the correct person using two identifiers. The limitations of evaluation and management by telemedicine and the availability of in person appointments were discussed. The patient expressed understanding and agreed to proceed.   Patient location: Home Provider location: [place] [facility] [employer] [facility] Persons participating in the virtual visit: Myself and Patient     Katina Degree. [name], MD 08/16/2020 12:33 PM

## 2020-08-17 ENCOUNTER — Encounter (HOSPITAL_COMMUNITY)
Admission: RE | Admit: 2020-08-17 | Discharge: 2020-08-17 | Disposition: A | Discharge status: Home / Self Care | Payer: BC Managed Care – PPO | Source: Ambulatory Visit | Attending: Cardiology | Admitting: Cardiology

## 2020-08-17 ENCOUNTER — Other Ambulatory Visit: Payer: Self-pay

## 2020-08-17 DIAGNOSIS — Z955 Presence of coronary angioplasty implant and graft: Secondary | ICD-10-CM | POA: Diagnosis not present

## 2020-08-17 DIAGNOSIS — I214 Non-ST elevation (NSTEMI) myocardial infarction: Secondary | ICD-10-CM

## 2020-08-18 ENCOUNTER — Other Ambulatory Visit: Payer: Self-pay | Admitting: Cardiology

## 2020-08-19 ENCOUNTER — Encounter (HOSPITAL_COMMUNITY): Payer: BC Managed Care – PPO

## 2020-08-19 ENCOUNTER — Telehealth (HOSPITAL_COMMUNITY): Payer: Self-pay | Admitting: *Deleted

## 2020-08-22 ENCOUNTER — Other Ambulatory Visit: Payer: Self-pay | Admitting: Family Medicine

## 2020-08-22 ENCOUNTER — Ambulatory Visit: Payer: BC Managed Care – PPO | Admitting: Cardiology

## 2020-08-22 ENCOUNTER — Other Ambulatory Visit: Payer: Self-pay

## 2020-08-22 ENCOUNTER — Encounter (HOSPITAL_COMMUNITY)
Admission: RE | Admit: 2020-08-22 | Discharge: 2020-08-22 | Disposition: A | Discharge status: Home / Self Care | Payer: BC Managed Care – PPO | Source: Ambulatory Visit | Attending: Cardiology | Admitting: Cardiology

## 2020-08-22 DIAGNOSIS — Z955 Presence of coronary angioplasty implant and graft: Secondary | ICD-10-CM | POA: Insufficient documentation

## 2020-08-22 DIAGNOSIS — I214 Non-ST elevation (NSTEMI) myocardial infarction: Secondary | ICD-10-CM | POA: Diagnosis not present

## 2020-08-22 DIAGNOSIS — R7989 Other specified abnormal findings of blood chemistry: Secondary | ICD-10-CM | POA: Diagnosis not present

## 2020-08-22 LAB — TESTOSTERONE: Testosterone: 195

## 2020-08-23 ENCOUNTER — Ambulatory Visit (INDEPENDENT_AMBULATORY_CARE_PROVIDER_SITE_OTHER): Payer: BC Managed Care – PPO | Admitting: *Deleted

## 2020-08-23 DIAGNOSIS — B351 Tinea unguium: Secondary | ICD-10-CM

## 2020-08-23 LAB — TESTOSTERONE: Testosterone: 195 ng/dL — ABNORMAL LOW (ref 264–916)

## 2020-08-23 NOTE — Progress Notes (Signed)
Patient presents today for the 1st laser treatment. Diagnosed with mycotic nail infection by Dr. Marylene Land.   Toenail most affected are the hallux nails, right over left, but is requesting laser on all nails.  All other systems are negative.  Nails were filed thin. Laser therapy was administered to 1-5 toenails bilateral and patient tolerated the treatment well. All safety precautions were in place.   He has taken a round of oral terbinafine, finished approximately 40 days ago.   Follow up in 4 weeks for laser # 2.  Picture of nails taken today to document visual progress

## 2020-08-23 NOTE — Patient Instructions (Signed)

## 2020-08-24 ENCOUNTER — Other Ambulatory Visit: Payer: Self-pay

## 2020-08-24 ENCOUNTER — Encounter: Payer: Self-pay | Admitting: Family Medicine

## 2020-08-24 ENCOUNTER — Encounter (HOSPITAL_COMMUNITY)
Admission: RE | Admit: 2020-08-24 | Discharge: 2020-08-24 | Disposition: A | Discharge status: Home / Self Care | Payer: BC Managed Care – PPO | Source: Ambulatory Visit | Attending: Cardiology | Admitting: Cardiology

## 2020-08-24 DIAGNOSIS — I214 Non-ST elevation (NSTEMI) myocardial infarction: Secondary | ICD-10-CM

## 2020-08-24 DIAGNOSIS — Z955 Presence of coronary angioplasty implant and graft: Secondary | ICD-10-CM

## 2020-08-25 ENCOUNTER — Other Ambulatory Visit: Payer: Self-pay | Admitting: Family Medicine

## 2020-08-25 MED ORDER — TESTOSTERONE 50 MG/5GM (1%) TD GEL: 5.0000 g | Freq: Every day | TRANSDERMAL | 0 refills | Status: DC

## 2020-08-25 NOTE — Progress Notes (Signed)
a 

## 2020-08-25 NOTE — Progress Notes (Signed)
Please inform patient of the following:  Testosterone level confirmed low. We will send in gel. Would like for him to come back in 3 months for office visit and we can recheck labs.

## 2020-08-26 ENCOUNTER — Encounter (HOSPITAL_COMMUNITY): Payer: BC Managed Care – PPO

## 2020-08-26 ENCOUNTER — Telehealth: Payer: Self-pay

## 2020-08-26 ENCOUNTER — Other Ambulatory Visit: Payer: BC Managed Care – PPO

## 2020-08-26 NOTE — Telephone Encounter (Signed)
Patient states pharamacy needs to seek alternative for testosterone (ANDROGEL) 50 MG/5GM (1%) GEL. Patient stated pharmacy has contacted our office to get authorization to change this. Patient also stated he would be okay with shot rather than gel. Please advise. Patient requested a call back when it is sent over to pharmacy

## 2020-08-26 NOTE — Telephone Encounter (Signed)
Please advice  

## 2020-08-29 ENCOUNTER — Telehealth (HOSPITAL_COMMUNITY): Payer: Self-pay | Admitting: Family Medicine

## 2020-08-29 ENCOUNTER — Encounter (HOSPITAL_COMMUNITY): Payer: BC Managed Care – PPO

## 2020-08-29 DIAGNOSIS — L301 Dyshidrosis [pompholyx]: Secondary | ICD-10-CM | POA: Diagnosis not present

## 2020-08-29 DIAGNOSIS — L2089 Other atopic dermatitis: Secondary | ICD-10-CM | POA: Diagnosis not present

## 2020-08-29 DIAGNOSIS — Z79899 Other long term (current) drug therapy: Secondary | ICD-10-CM | POA: Diagnosis not present

## 2020-08-29 MED ORDER — TESTOSTERONE CYPIONATE 200 MG/ML IM SOLN
200.0000 mg | INTRAMUSCULAR | 0 refills | Status: DC
Start: 2020-08-29 — End: 2021-02-21

## 2020-08-30 ENCOUNTER — Telehealth: Payer: Self-pay | Admitting: *Deleted

## 2020-08-30 NOTE — Telephone Encounter (Signed)
PA Rx Testosterone Cypionate 200mg /ml  request has been denied.

## 2020-08-30 NOTE — Telephone Encounter (Signed)
Recommend patient check with insurance to see what is covered.  Michael [name]. [name], MD 08/30/2020 12:19 PM

## 2020-08-30 NOTE — Telephone Encounter (Signed)
PA Case ID: [id number] Rx Testosterone Cypionate 200mg /ml Was send today, awaiting for determination

## 2020-08-31 ENCOUNTER — Encounter (HOSPITAL_COMMUNITY)
Admission: RE | Admit: 2020-08-31 | Discharge: 2020-08-31 | Disposition: A | Discharge status: Home / Self Care | Payer: BC Managed Care – PPO | Source: Ambulatory Visit | Attending: Cardiology | Admitting: Cardiology

## 2020-08-31 ENCOUNTER — Other Ambulatory Visit: Payer: Self-pay

## 2020-08-31 VITALS — Ht 68.75 in | Wt 331.8 lb

## 2020-08-31 DIAGNOSIS — Z955 Presence of coronary angioplasty implant and graft: Secondary | ICD-10-CM | POA: Diagnosis not present

## 2020-08-31 DIAGNOSIS — I214 Non-ST elevation (NSTEMI) myocardial infarction: Secondary | ICD-10-CM | POA: Diagnosis not present

## 2020-09-01 ENCOUNTER — Encounter: Payer: Self-pay | Admitting: Family Medicine

## 2020-09-01 ENCOUNTER — Other Ambulatory Visit: Payer: Self-pay | Admitting: *Deleted

## 2020-09-01 NOTE — Telephone Encounter (Signed)
Called patient advise to call insurance for information

## 2020-09-01 NOTE — Telephone Encounter (Signed)
Spoke with patient  Will call insurance for PA

## 2020-09-02 ENCOUNTER — Encounter (HOSPITAL_COMMUNITY)
Admission: RE | Admit: 2020-09-02 | Discharge: 2020-09-02 | Disposition: A | Discharge status: Home / Self Care | Payer: BC Managed Care – PPO | Source: Ambulatory Visit | Attending: Cardiology | Admitting: Cardiology

## 2020-09-02 ENCOUNTER — Other Ambulatory Visit: Payer: Self-pay

## 2020-09-02 DIAGNOSIS — Z955 Presence of coronary angioplasty implant and graft: Secondary | ICD-10-CM

## 2020-09-02 DIAGNOSIS — I214 Non-ST elevation (NSTEMI) myocardial infarction: Secondary | ICD-10-CM

## 2020-09-02 NOTE — Telephone Encounter (Signed)
Patient is requesting a call once PA has been submitted.

## 2020-09-02 NOTE — Progress Notes (Signed)
Discharge Progress Report  Patient Details  Name: Michael [name] MRN: [id number] Date of Birth: 1966-03-07 Referring Provider:     [place] from 06/30/2020 in [place]  Referring Provider Adrian Prows, MD       Number of Visits: 19  Reason for Discharge:  Patient reached a stable level of exercise. Patient independent in their exercise. Patient has met program and personal goals.  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  ADL [facility]:   Initial Exercise Prescription:  Initial Exercise Prescription - 06/30/20 1200      Date of Initial Exercise RX and Referring Provider   Date 06/30/20    Referring Provider Adrian Prows, MD    Expected Discharge Date 09/02/20      T5 Nustep   Level 3    SPM 75    Minutes 15    METs 2.5      Track   Laps 12    Minutes 15    METs 3.09      Prescription Details   Frequency (times per week) 3    Duration Progress to 30 minutes of continuous aerobic without signs/symptoms of physical distress      Intensity   THRR 40-80% of Max Heartrate 67-134    Ratings of Perceived Exertion 11-13    Perceived Dyspnea 0-4      Progression   Progression Continue progressive overload as per policy without signs/symptoms or physical distress.      Resistance Training   Training Prescription Yes    Weight 5 lbs    Reps 10-15           Discharge Exercise Prescription (Final Exercise Prescription Changes):  Exercise Prescription Changes - 09/02/20 1644      Response to Exercise   Blood Pressure (Admit) 138/80    Blood Pressure (Exercise) 128/78    Blood Pressure (Exit) 100/62    Heart Rate (Admit) 78 bpm    Heart Rate (Exercise) 105 bpm    Heart Rate (Exit) 78 bpm    Rating of Perceived Exertion (Exercise) 11     Perceived Dyspnea (Exercise) 0    Symptoms None    Comments Pt graduated from Cardiac Rehab    Duration Continue with 45 min of aerobic exercise without signs/symptoms of physical distress.    Intensity THRR unchanged      Progression   Progression Continue to progress workloads to maintain intensity without signs/symptoms of physical distress.    Average METs 3.3      Resistance Training   Training Prescription Yes    Weight 6lbs    Reps 10-15    Time 10 Minutes      Interval Training   Interval Training No      T5 Nustep   Level 4    SPM 95    Minutes 15    METs 3.1      Track   Laps 22    Minutes 15    METs 3.55      Home Exercise Plan   Plans to continue exercise at Home (comment)   Stationary Bike   Frequency Add 3 additional days to program exercise sessions.    Initial Home Exercises Provided 07/27/20  Functional Capacity:  6 Minute Walk    Row Name 06/30/20 0947 08/31/20 1013       6 Minute Walk   Phase Initial Discharge    Distance 1721 feet 2131 feet    Distance % Change -- 23.82 %    Distance Feet Change -- 410 ft    Walk Time 6 minutes 6 minutes    # of Rest Breaks 0 0    MPH 3.26 4.1    METS 3.31 4    RPE 12 10    Perceived Dyspnea  1 0    VO2 Peak 11.58 14.2    Symptoms Yes (comment) Yes (comment)    Comments Mild SOB: RPD = 1 --    Resting HR 66 bpm 68 bpm    Resting BP 104/68 110/70    Resting Oxygen Saturation  95 % --    Exercise Oxygen Saturation  during 6 min walk 97 % --    Max Ex. HR 98 bpm 110 bpm    Max Ex. BP 138/64 130/74    2 Minute Post BP 130/60 114/68           Psychological, QOL, Others - Outcomes: PHQ 2/9: Depression screen [facility] 2/9 09/02/2020 06/30/2020 08/31/2019 08/28/2018  Decreased Interest 0 0 1 0  Down, Depressed, Hopeless 0 0 0 0  PHQ - 2 Score 0 0 1 0  Altered sleeping - - 3 -  Tired, decreased energy - - 3 -  Change in appetite - - 1 -  Feeling bad or failure about yourself  - - 0 -   Trouble concentrating - - 2 -  Moving slowly or fidgety/restless - - 0 -  Suicidal thoughts - - 0 -  PHQ-9 Score - - 10 -  Difficult doing work/chores - - Extremely dIfficult -    Quality of Life:  Quality of Life - 08/31/20 1014      Quality of Life   Select Quality of Life      Quality of Life Scores   Health/Function Pre 19.07 %    Health/Function Post 23.57 %    Health/Function % Change 23.6 %    Socioeconomic Pre 21.94 %    Socioeconomic Post 23.06 %    Socioeconomic % Change  5.1 %    Psych/Spiritual Pre 20.93 %    Psych/Spiritual Post 24.21 %    Psych/Spiritual % Change 15.67 %    Family Pre 27.6 %    Family Post 28.3 %    Family % Change 2.54 %    GLOBAL Pre 21.31 %    GLOBAL Post 24.26 %    GLOBAL % Change 13.84 %           Personal Goals: Goals established at orientation with interventions provided to work toward goal.  Personal Goals and Risk Factors at Admission - 06/30/20 1414      Core Components/Risk Factors/Patient Goals on Admission    Weight Management Yes;Obesity;Weight Loss    Intervention Weight Management: Develop a combined nutrition and exercise program designed to reach desired caloric intake, while maintaining appropriate intake of nutrient and fiber, sodium and fats, and appropriate energy expenditure required for the weight goal.;Weight Management: Provide education and appropriate resources to help participant work on and attain dietary goals.;Weight Management/Obesity: Establish reasonable short term and long term weight goals.;Obesity: Provide education and appropriate resources to help participant work on and attain dietary goals.    Admit Weight 329 lb 12.9 oz (  149.6 kg)    Goal Weight: Long Term 225 lb (102.1 kg)    Expected Outcomes Short Term: Continue to assess and modify interventions until short term weight is achieved;Long Term: Adherence to nutrition and physical activity/exercise program aimed toward attainment of established  weight goal;Weight Maintenance: Understanding of the daily nutrition guidelines, which includes 25-35% calories from fat, 7% or less cal from saturated fats, less than 22m cholesterol, less than 1.5gm of sodium, & 5 or more servings of fruits and vegetables daily;Weight Loss: Understanding of general recommendations for a balanced deficit meal plan, which promotes 1-2 lb weight loss per week and includes a negative energy balance of [phone] kcal/d;Understanding recommendations for meals to include 15-35% energy as protein, 25-35% energy from fat, 35-60% energy from carbohydrates, less than 2083mof dietary cholesterol, 20-35 gm of total fiber daily;Understanding of distribution of calorie intake throughout the day with the consumption of 4-5 meals/snacks    Hypertension Yes    Intervention Provide education on lifestyle modifcations including regular physical activity/exercise, weight management, moderate sodium restriction and increased consumption of fresh fruit, vegetables, and low fat dairy, alcohol moderation, and smoking cessation.;Monitor prescription use compliance.    Expected Outcomes Short Term: Continued assessment and intervention until BP is < 140/9025mG in hypertensive participants. < 130/41m70m in hypertensive participants with diabetes, heart failure or chronic kidney disease.;Long Term: Maintenance of blood pressure at goal levels.    Lipids Yes    Intervention Provide education and support for participant on nutrition & aerobic/resistive exercise along with prescribed medications to achieve LDL <70mg43mL >40mg.41mExpected Outcomes Short Term: Participant states understanding of desired cholesterol values and is compliant with medications prescribed. Participant is following exercise prescription and nutrition guidelines.;Long Term: Cholesterol controlled with medications as prescribed, with individualized exercise RX and with personalized nutrition plan. Value goals: LDL < 70mg, 17m>  40 mg.    Stress Yes    Intervention Offer individual and/or small group education and counseling on adjustment to heart disease, stress management and health-related lifestyle change. Teach and support self-help strategies.;Refer participants experiencing significant psychosocial distress to appropriate mental health specialists for further evaluation and treatment. When possible, include family members and significant others in education/counseling sessions.    Expected Outcomes Short Term: Participant demonstrates changes in health-related behavior, relaxation and other stress management skills, ability to obtain effective social support, and compliance with psychotropic medications if prescribed.;Long Term: Emotional wellbeing is indicated by absence of clinically significant psychosocial distress or social isolation.            Personal Goals Discharge:  Goals and Risk Factor Review    Row Name 07/11/20 1307 07/26/20 1607 08/16/20 1239 08/16/20 1244 09/02/20 1219     Core Components/Risk Factors/Patient Goals Review   Personal Goals Review Weight Management/Obesity;Lipids;Stress;Hypertension Weight Management/Obesity;Lipids;Stress;Hypertension Weight Management/Obesity;Lipids;Stress;Hypertension Lipids Lipids;Weight Management/Obesity   Review Mr. McMahonTithltiple CAD risk factors. He is eager to participate in CR for risk factor modifications. He denies barriers to participation in CR or self health management. His goals are to loose weight, better his health, and increase his energy level. Avry haJamallen doing well with exercise. Corbin haJaegaren able to move his work loads up accordingly Cadan haBriscoeen doing well with exercise. Raffaele haKeyonteen able to move his work loads up accordingly -- --   Expected Outcomes Patient will continue to participate in CR for risk factor modifications. Patient will continue to participate in CR for risk factor modifications.  Patient will continue to participate in  CR for risk factor modifications. -- --   Row Name 09/02/20 1220             Core Components/Risk Factors/Patient Goals Review   Personal Goals Review Hypertension;Lipids;Weight Management/Obesity;Stress       Review Aimee did well with exercise at cardiac rehab. Clebert's vital signs were stable. Gianni plans to continue exercise by walking and using his bike at home       Expected Dresser will continue to exercise, follow nutrtion and lifestyle modifications upon completion of phase 2 cardiac rehab.              Exercise Goals and Review:  Exercise Goals    Row Name 06/30/20 1231             Exercise Goals   Increase Physical Activity Yes       Intervention Provide advice, education, support and counseling about physical activity/exercise needs.;Develop an individualized exercise prescription for aerobic and resistive training based on initial evaluation findings, risk stratification, comorbidities and participant's personal goals.       Expected Outcomes Short Term: Attend rehab on a regular basis to increase amount of physical activity.;Long Term: Add in home exercise to make exercise part of routine and to increase amount of physical activity.;Long Term: Exercising regularly at least 3-5 days a week.       Increase Strength and Stamina Yes       Intervention Provide advice, education, support and counseling about physical activity/exercise needs.;Develop an individualized exercise prescription for aerobic and resistive training based on initial evaluation findings, risk stratification, comorbidities and participant's personal goals.       Expected Outcomes Short Term: Increase workloads from initial exercise prescription for resistance, speed, and METs.;Short Term: Perform resistance training exercises routinely during rehab and add in resistance training at home;Long Term: Improve cardiorespiratory fitness, muscular endurance and strength as measured by increased METs and  functional capacity (6MWT)       Able to understand and use rate of perceived exertion (RPE) scale Yes       Intervention Provide education and explanation on how to use RPE scale       Expected Outcomes Short Term: Able to use RPE daily in rehab to express subjective intensity level;Long Term:  Able to use RPE to guide intensity level when exercising independently       Knowledge and understanding of Target Heart Rate Range (THRR) Yes       Intervention Provide education and explanation of THRR including how the numbers were predicted and where they are located for reference       Expected Outcomes Short Term: Able to state/look up THRR;Long Term: Able to use THRR to govern intensity when exercising independently;Short Term: Able to use daily as guideline for intensity in rehab       Able to check pulse independently Yes       Intervention Provide education and demonstration on how to check pulse in carotid and radial arteries.;Review the importance of being able to check your own pulse for safety during independent exercise       Expected Outcomes Short Term: Able to explain why pulse checking is important during independent exercise;Long Term: Able to check pulse independently and accurately       Understanding of Exercise Prescription Yes       Intervention Provide education, explanation, and written materials on patient's individual exercise prescription  Expected Outcomes Short Term: Able to explain program exercise prescription;Long Term: Able to explain home exercise prescription to exercise independently              Exercise Goals Re-Evaluation:  Exercise Goals Re-Evaluation    Row Name 07/11/20 1025 07/27/20 1017 08/18/20 0708 09/12/20 1647       Exercise Goal Re-Evaluation   Exercise Goals Review Able to understand and use rate of perceived exertion (RPE) scale;Knowledge and understanding of Target Heart Rate Range (THRR);Understanding of Exercise Prescription Increase  Physical Activity;Increase Strength and Stamina;Able to understand and use rate of perceived exertion (RPE) scale;Knowledge and understanding of Target Heart Rate Range (THRR);Able to check pulse independently;Understanding of Exercise Prescription Increase Physical Activity;Increase Strength and Stamina;Able to understand and use rate of perceived exertion (RPE) scale;Knowledge and understanding of Target Heart Rate Range (THRR);Able to check pulse independently;Understanding of Exercise Prescription Increase Physical Activity;Increase Strength and Stamina;Able to understand and use rate of perceived exertion (RPE) scale;Knowledge and understanding of Target Heart Rate Range (THRR);Able to check pulse independently;Understanding of Exercise Prescription    Comments Pt's first day of exercise. Pt responded well. Pt able to exercise 30 minutes with minimal difficulty. Reviewed METs with pt and Home Exercise Plan. Reviewed THRR, RPE Scale, weather conditions, endpoints of exercise, NTG use, warmup, and cool down. Pt is responding well to workload increases. Pt is now exercising on level 4 on Nustep. Pt states he is feeling stronger since starting program. Pt completed 19 sessions of Cardiac Rehab. Pt increased his functional capacity by 23.82%. Pt also increased his post 101mt distance by [id number]    Expected Outcomes Pt will continue to increase strength and stamina. Pt will exercise 2-3 days at home 30-4517mtes, using stationary bike and walking. Pt will continue to  exercise 2-3 days at home 30-87m5mes, using stationary bike and walking. Pt will continue to exercise 3-4 days a week for 30-45 minutes.           Nutrition & Weight - Outcomes:  Pre Biometrics - 06/30/20 0900      Pre Biometrics   Waist Circumference 62 inches    Hip Circumference 61 inches    Waist to Hip Ratio 1.02 %    Triceps Skinfold 9 mm    % Body Fat 43.6 %    Grip Strength 58.5 kg    Flexibility 9.5 in    Single Leg Stand  18.93 seconds           Post Biometrics - 08/31/20 1013       Post  Biometrics   Height 5' 8.75" (1.746 m)    Weight 150.5 kg    Waist Circumference 62.5 inches    Hip Circumference 62 inches    Waist to Hip Ratio 1.01 %    BMI (Calculated) 49.37    Triceps Skinfold 9 mm    % Body Fat 43.9 %    Grip Strength 55 kg    Flexibility 13 in    Single Leg Stand 29.5 seconds           Nutrition:  Nutrition Therapy & Goals - 07/13/20 1025      Nutrition Therapy   Diet Heart Healthy    Drug/Food Interactions Statins/Certain Fruits      Personal Nutrition Goals   Nutrition Goal Pt to identify food quantities necessary to achieve weight loss of 6-24 lb at graduation from cardiac rehab.    Personal Goal #2 Pt to build a healthy plate including vegetables,  fruits, whole grains, and low-fat dairy products in a heart healthy meal plan.      Intervention Plan   Intervention Nutrition handout(s) given to patient.;Prescribe, educate and counsel regarding individualized specific dietary modifications aiming towards targeted core components such as weight, hypertension, lipid management, diabetes, heart failure and other comorbidities.    Expected Outcomes Long Term Goal: Adherence to prescribed nutrition plan.;Short Term Goal: A plan has been developed with personal nutrition goals set during dietitian appointment.           Nutrition Discharge:  Nutrition Assessments - 07/13/20 1026      MEDFICTS Scores   Pre Score 15           Education Questionnaire Score:  Knowledge Questionnaire Score - 08/31/20 1014      Knowledge Questionnaire Score   Pre Score 23/24    Post Score 24/24           Goals reviewed with patient; copy given to patient.Fredie graduated from cardiac rehab program on 09/02/20 with completion of 19 exercise sessions in Phase II. Pt maintained good attendance and progressed nicely during his participation in rehab as evidenced by increased MET level.    Medication list reconciled. Repeat  PHQ score-0  .  Pt has made significant lifestyle changes and should be commended for his success. Pt feels he has achieved his goals during cardiac rehab.   Pt plans to continue exercise by walking using his exercise bike at home and using his home weights. Bobbyjoe increased his distance on his post exercise walk test by 410 feet. We are proud of Brice's progress!Barnet Pall, RN,BSN 09/26/2020 3:00 PM

## 2020-09-06 DIAGNOSIS — Z23 Encounter for immunization: Secondary | ICD-10-CM | POA: Diagnosis not present

## 2020-09-06 DIAGNOSIS — I10 Essential (primary) hypertension: Secondary | ICD-10-CM | POA: Diagnosis not present

## 2020-09-06 DIAGNOSIS — H6122 Impacted cerumen, left ear: Secondary | ICD-10-CM | POA: Diagnosis not present

## 2020-09-12 ENCOUNTER — Encounter: Payer: Self-pay | Admitting: Family Medicine

## 2020-09-12 DIAGNOSIS — I10 Essential (primary) hypertension: Secondary | ICD-10-CM

## 2020-09-12 MED ORDER — BUMETANIDE 0.5 MG PO TABS: 0.5000 mg | ORAL_TABLET | Freq: Every day | ORAL | 2 refills | Status: DC

## 2020-09-12 NOTE — Telephone Encounter (Signed)
Patient wants to try Bumex instead of chlorthalidone as he read in some place that it will reduce risk of Alzheimer's disease.  I have discontinued chlorthalidone and switch him to Bumex 0.5 mg daily.   Yates Decamp, MD, [facility] 09/12/2020, 10:16 AM Office: [phone] Pager: [phone]

## 2020-09-13 NOTE — Telephone Encounter (Signed)
PA Rx Sildenafil 20mg  was denied

## 2020-09-13 NOTE — Telephone Encounter (Signed)
Referral placed x 2 09/12/2020

## 2020-09-13 NOTE — Telephone Encounter (Signed)
PA placed again on 09/13/2020 Per patient  Possible alternatives  Testost cyp mdv 100mg /ml Testost cyp sdv 200mg /ml Testosterone Pow cypionat

## 2020-09-15 ENCOUNTER — Other Ambulatory Visit: Payer: Self-pay | Admitting: Family Medicine

## 2020-09-15 NOTE — Telephone Encounter (Signed)
PA Testosterone Cypionate 200MG /ML intramuscular solution request has been denied  Possible alternatives  Testost cyp mdv 100mg /ml Testost cyp sdv 200mg /ml Testosterone Pow cypionat

## 2020-09-21 NOTE — Telephone Encounter (Signed)
Appeal was send today

## 2020-09-26 ENCOUNTER — Telehealth: Payer: Self-pay | Admitting: *Deleted

## 2020-09-26 NOTE — Addendum Note (Signed)
Encounter addended by: Cammy Copa, RN on: 09/26/2020 3:01 PM  Actions taken: Clinical Note Signed

## 2020-09-26 NOTE — Telephone Encounter (Signed)
Drug Testosterone Cypionate 200MG /ML intramuscular solution  Your PA request has been approved. Additional information will be provided in the approval communication pharmacy notified

## 2020-10-03 ENCOUNTER — Other Ambulatory Visit: Payer: Self-pay

## 2020-10-03 ENCOUNTER — Ambulatory Visit (INDEPENDENT_AMBULATORY_CARE_PROVIDER_SITE_OTHER): Payer: BC Managed Care – PPO | Admitting: *Deleted

## 2020-10-03 DIAGNOSIS — B351 Tinea unguium: Secondary | ICD-10-CM

## 2020-10-03 NOTE — Progress Notes (Signed)
Patient presents today for the 2nd laser treatment. Diagnosed with mycotic nail infection by Dr. Marylene Land.   Toenail most affected are the hallux nails, right over left, but is requesting laser on all nails.   All other systems are negative.  Nails were filed thin. Laser therapy was administered to 1-5 toenails bilateral and patient tolerated the treatment well. All safety precautions were in place.   He has taken a round of oral terbinafine.   Follow up in 4 weeks for laser # 3.

## 2020-10-19 DIAGNOSIS — Z23 Encounter for immunization: Secondary | ICD-10-CM | POA: Diagnosis not present

## 2020-10-31 ENCOUNTER — Other Ambulatory Visit: Payer: Self-pay

## 2020-10-31 ENCOUNTER — Ambulatory Visit (INDEPENDENT_AMBULATORY_CARE_PROVIDER_SITE_OTHER): Payer: BC Managed Care – PPO | Admitting: *Deleted

## 2020-10-31 DIAGNOSIS — B351 Tinea unguium: Secondary | ICD-10-CM

## 2020-10-31 NOTE — Progress Notes (Signed)
Patient presents today for the 3rd laser treatment. Diagnosed with mycotic nail infection by Dr. Marylene Land.   Toenail most affected are the hallux nails, right over left, but is requesting laser on all nails.   The hallux nails have improved quite a bit.  All other systems are negative.  Nails were filed thin. Laser therapy was administered to 1-5 toenails bilateral and patient tolerated the treatment well. All safety precautions were in place.   He has taken a round of oral terbinafine.   Follow up in 6 weeks for laser # 4.

## 2020-11-01 ENCOUNTER — Other Ambulatory Visit: Payer: Self-pay

## 2020-11-02 ENCOUNTER — Other Ambulatory Visit: Payer: Self-pay

## 2020-11-02 MED ORDER — FUROSEMIDE 20 MG PO TABS: 20.0000 mg | ORAL_TABLET | Freq: Every day | ORAL | 6 refills | Status: DC | PRN

## 2020-11-02 MED ORDER — PRASUGREL HCL 10 MG PO TABS: 10.0000 mg | ORAL_TABLET | Freq: Every day | ORAL | 6 refills | Status: DC

## 2020-11-02 MED ORDER — ATORVASTATIN CALCIUM 80 MG PO TABS: 80.0000 mg | ORAL_TABLET | Freq: Every day | ORAL | 6 refills | Status: DC

## 2020-11-02 MED ORDER — METOPROLOL TARTRATE 25 MG PO TABS: 25.0000 mg | ORAL_TABLET | Freq: Two times a day (BID) | ORAL | 6 refills | Status: DC

## 2020-11-02 MED ORDER — ASPIRIN 81 MG PO TBEC: 81.0000 mg | DELAYED_RELEASE_TABLET | Freq: Every day | ORAL | 6 refills | Status: DC

## 2020-11-17 ENCOUNTER — Telehealth: Payer: Self-pay | Admitting: Neurology

## 2020-11-17 ENCOUNTER — Encounter: Payer: Self-pay | Admitting: Neurology

## 2020-11-17 NOTE — Telephone Encounter (Signed)
Called the patient to advise that the apt on Monday scheduled was for initial cpap follow up visit. Advised that since the patient has not started the machine there was no need for him to have to come to this apt. We would just cancel that for him and reschedule once he is set up with the machine. There was no answer. LVM for the pt to reach back out letting us know and there would be no no show fees since he is unable to call back today and tomorrow due to our office being closed.

## 2020-11-21 ENCOUNTER — Ambulatory Visit: Payer: Self-pay | Admitting: Neurology

## 2020-11-21 DIAGNOSIS — Z79899 Other long term (current) drug therapy: Secondary | ICD-10-CM | POA: Diagnosis not present

## 2020-11-21 DIAGNOSIS — L301 Dyshidrosis [pompholyx]: Secondary | ICD-10-CM | POA: Diagnosis not present

## 2020-11-22 DIAGNOSIS — Z79899 Other long term (current) drug therapy: Secondary | ICD-10-CM | POA: Diagnosis not present

## 2020-12-06 ENCOUNTER — Other Ambulatory Visit: Payer: Self-pay | Admitting: Cardiology

## 2020-12-06 DIAGNOSIS — I10 Essential (primary) hypertension: Secondary | ICD-10-CM

## 2020-12-12 ENCOUNTER — Ambulatory Visit (INDEPENDENT_AMBULATORY_CARE_PROVIDER_SITE_OTHER): Payer: BC Managed Care – PPO | Admitting: *Deleted

## 2020-12-12 ENCOUNTER — Other Ambulatory Visit: Payer: Self-pay

## 2020-12-12 DIAGNOSIS — B351 Tinea unguium: Secondary | ICD-10-CM

## 2020-12-12 NOTE — Progress Notes (Signed)
Patient presents today for the 4th laser treatment. Diagnosed with mycotic nail infection by Dr. Marylene Land.   Toenail most affected are the hallux nails, right over left, but is requesting laser on all nails.   The hallux nails have almost completely grown out and looking a lot better.  All other systems are negative.  Nails were filed thin. Laser therapy was administered to 1-5 toenails bilateral and patient tolerated the treatment well. All safety precautions were in place.   He has taken a round of oral terbinafine.   Follow up in 6 weeks for laser # 5.

## 2020-12-17 IMAGING — DX DG CHEST 2V
2 series · 2 of 2 positions shown · non-contrast
Comparison: 04/22/2012

CLINICAL DATA: Chest pain

EXAM:
CHEST - 2 VIEW

[chest pa]
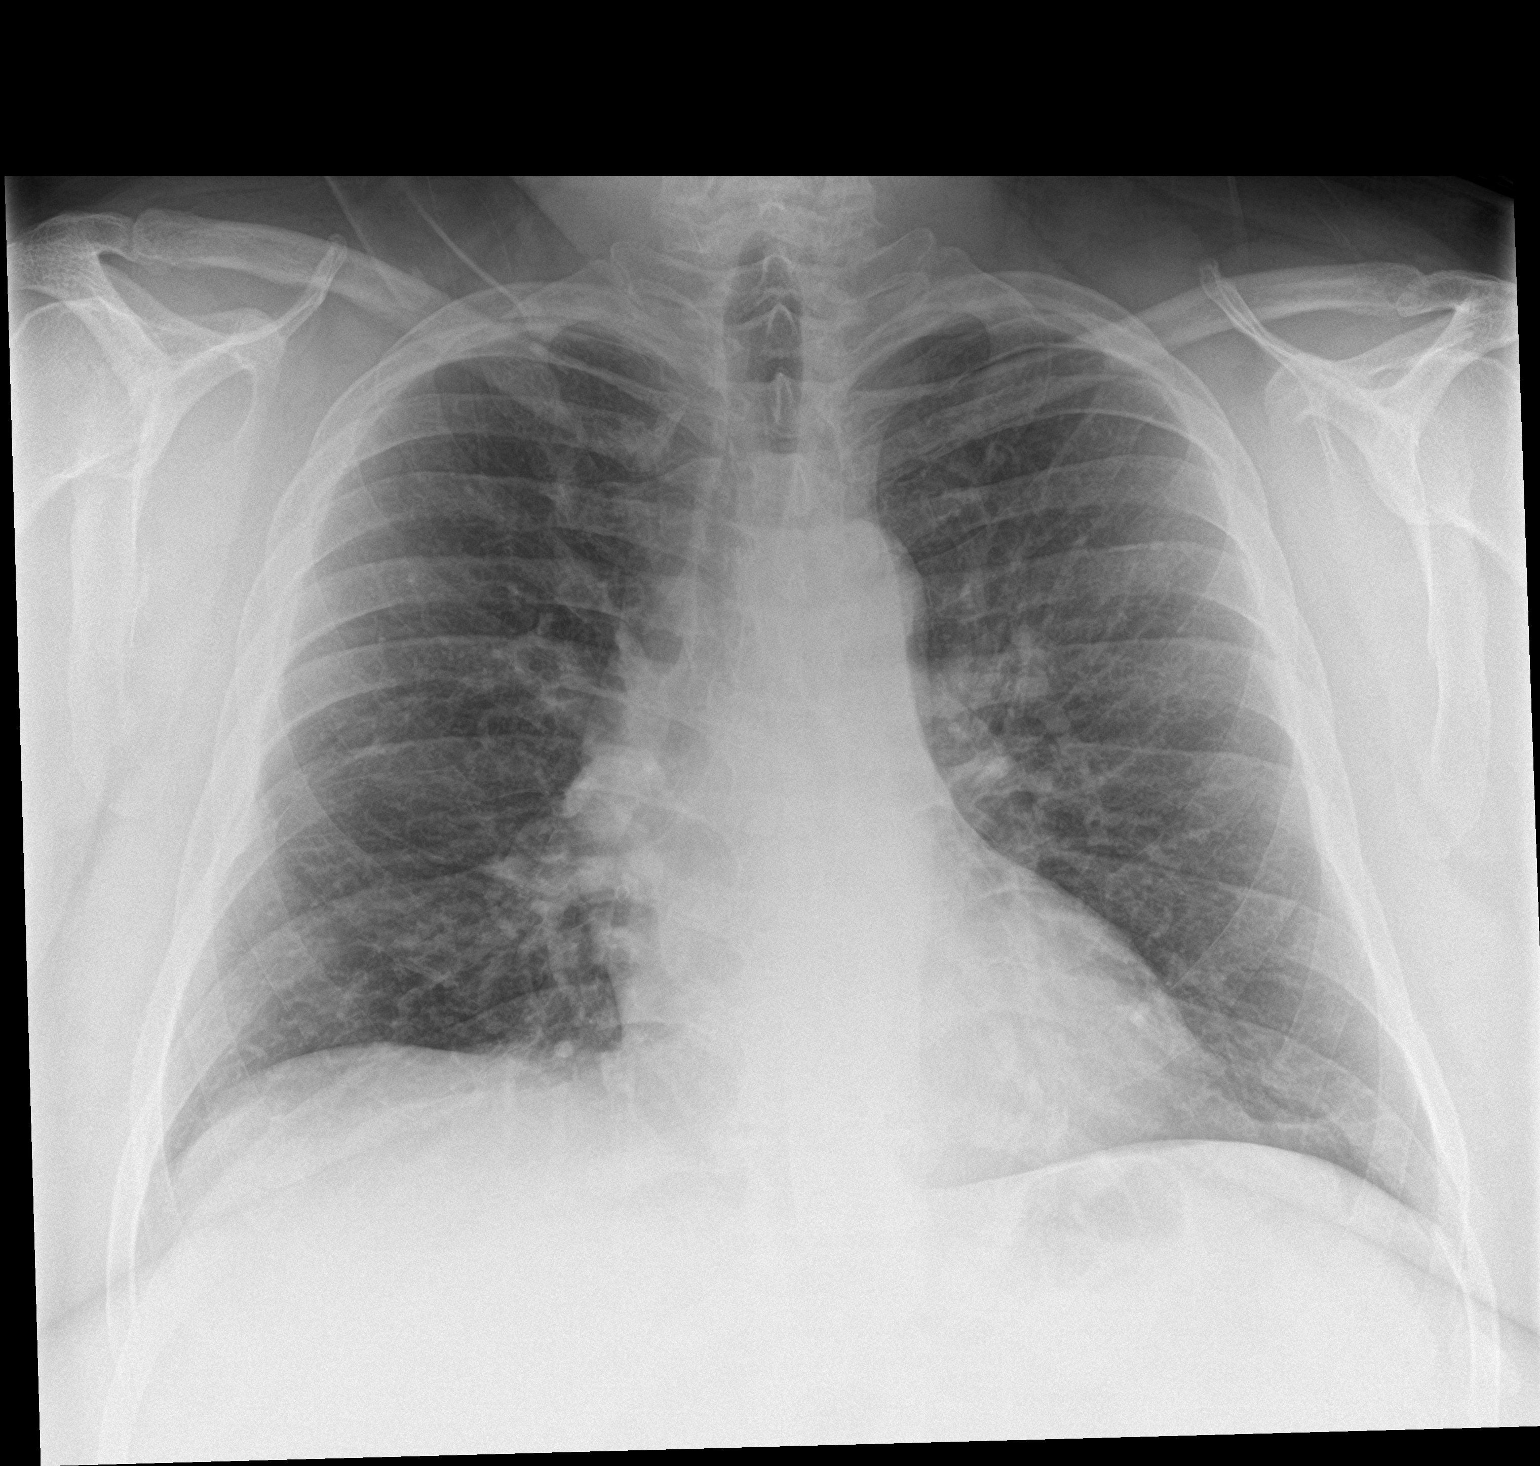

[chest lat]
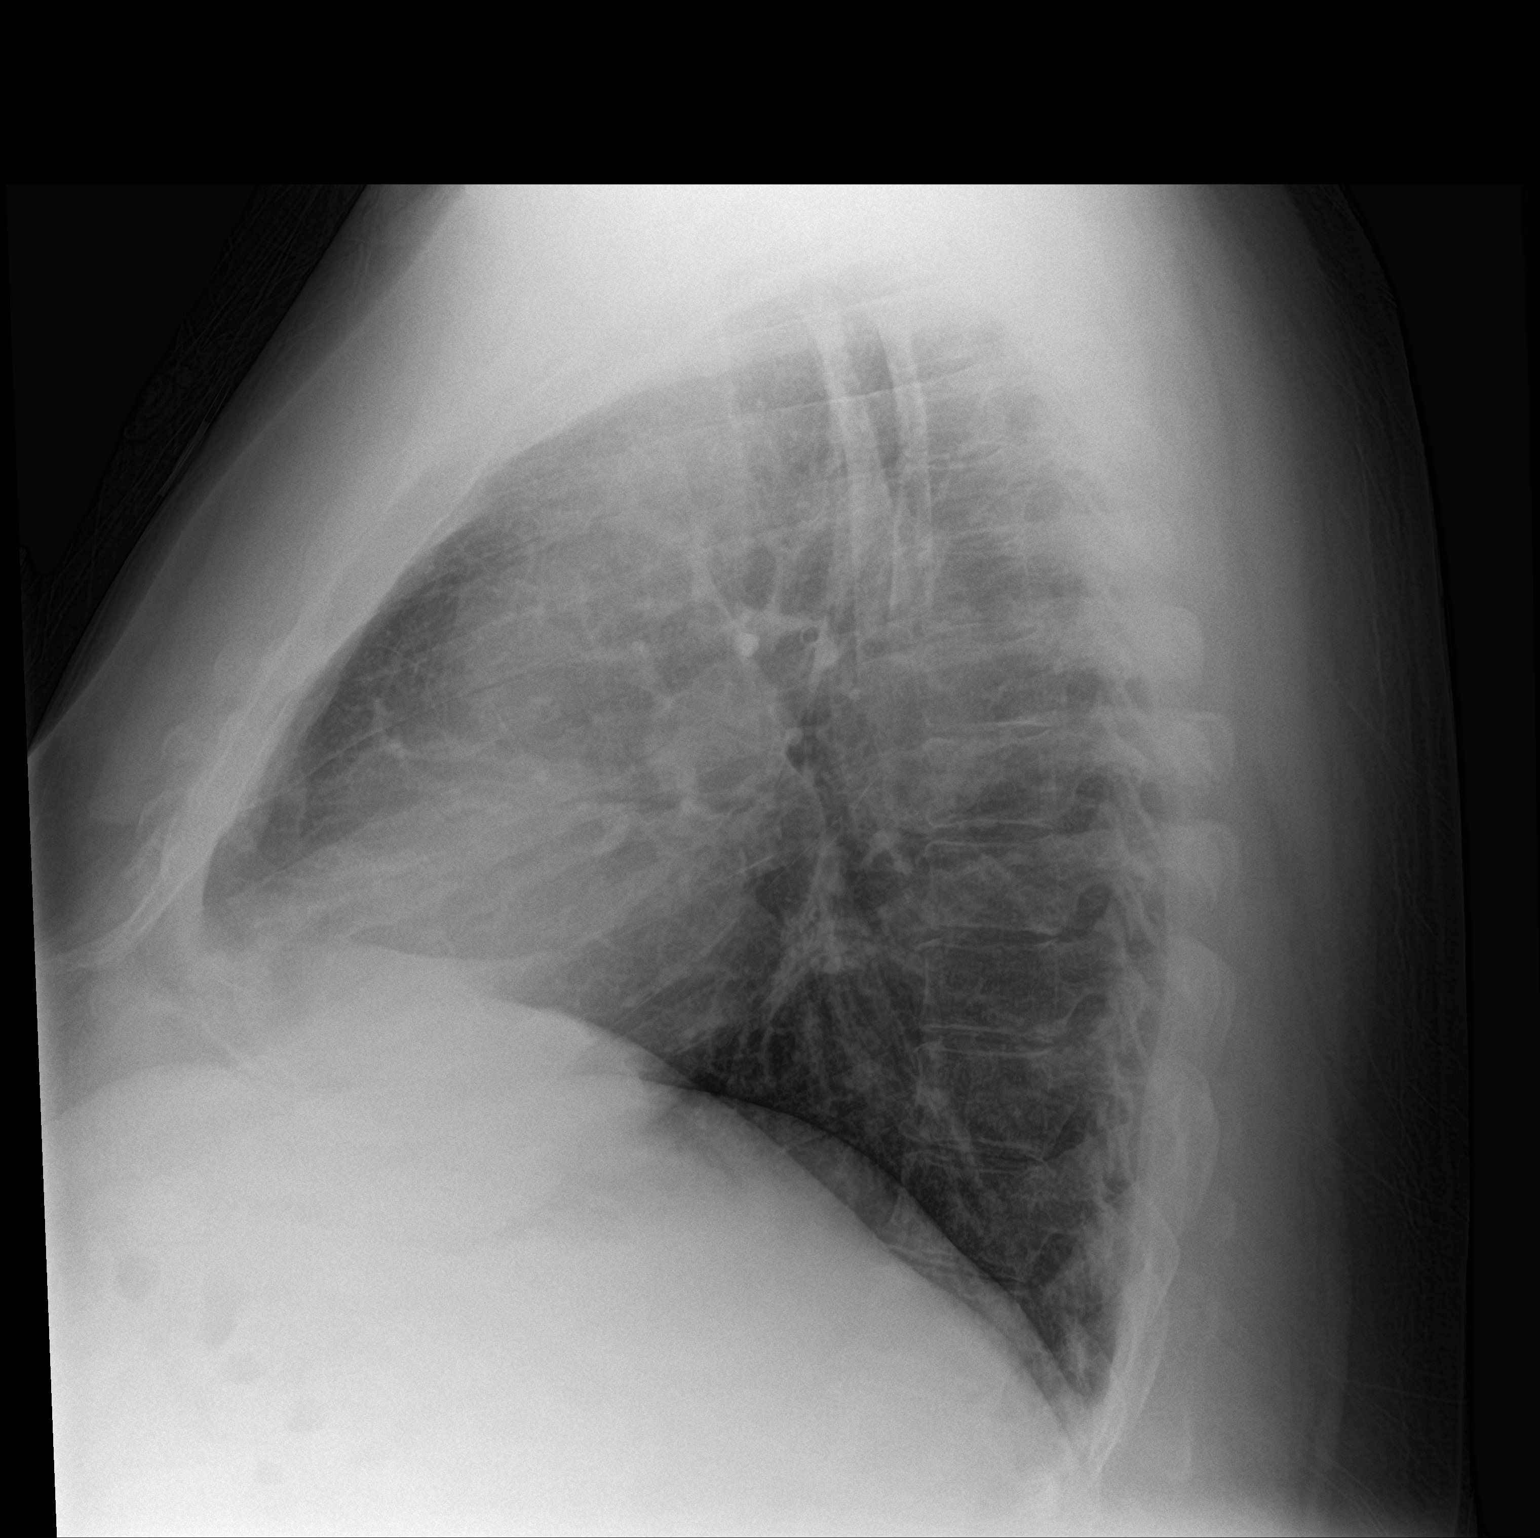

[2 of 2 positions shown; findings below may reference images not displayed]

FINDINGS: The heart size and mediastinal contours are within normal limits.
Both lungs are clear. The visualized skeletal structures are
unremarkable.
IMPRESSION: No active cardiopulmonary disease.

## 2020-12-17 IMAGING — CT CT ANGIO CHEST
2 of 7 series · 17 of 46 positions shown · IV contrast (APPLIED)
Comparison: 05/19/2020

CLINICAL DATA: Chest pain, short of breath, recent prolonged
travel, hypertension

EXAM:
CT ANGIOGRAPHY CHEST WITH CONTRAST
TECHNIQUE: Multidetector CT imaging of the chest was performed using the
standard protocol during bolus administration of intravenous
contrast. Multiplanar CT image reconstructions and MIPs were
obtained to evaluate the vascular anatomy.
CONTRAST:  100mL OMNIPAQUE IOHEXOL 350 MG/ML SOLN

[Series 7: thins · axial · 0.98mm/px · z∈[-294,-8]mm · 14 of 460 slices shown]
[im 26/460  lung]
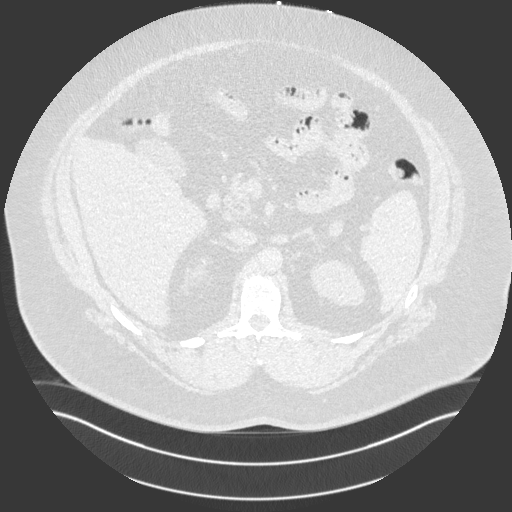
[im 52/460  soft-tissue]
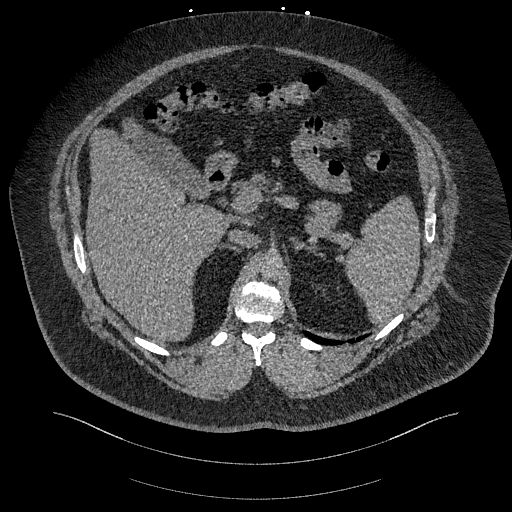
[im 103/460  lung]
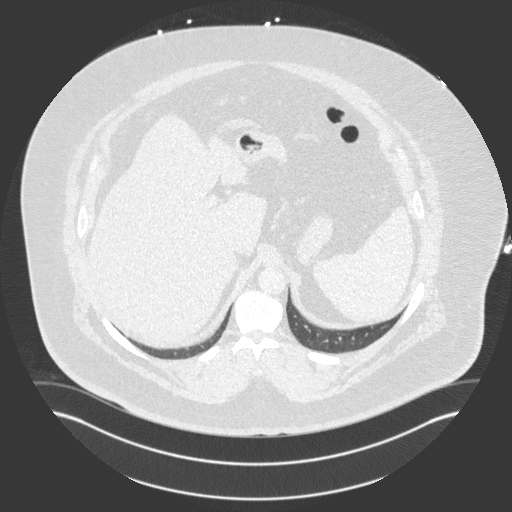
[im 128/460  soft-tissue]
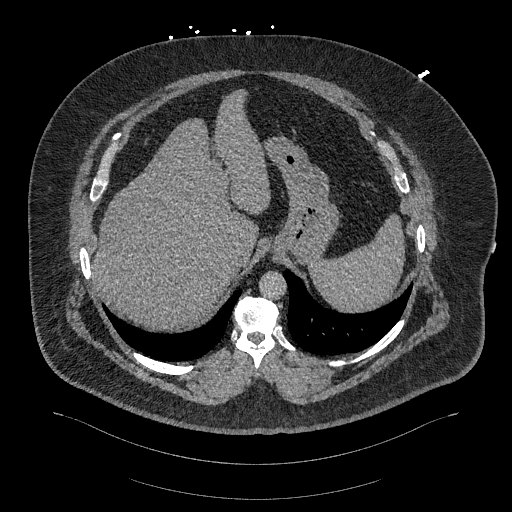
[im 154/460  lung]
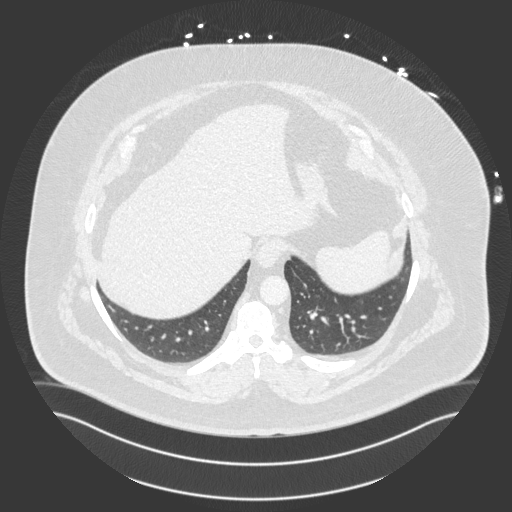
[im 179/460  soft-tissue]
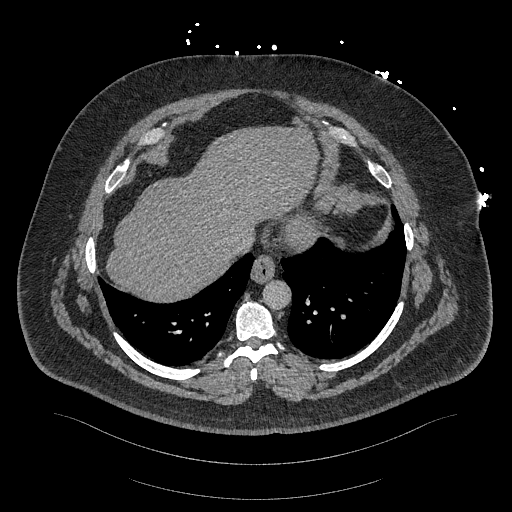
[im 205/460  lung]
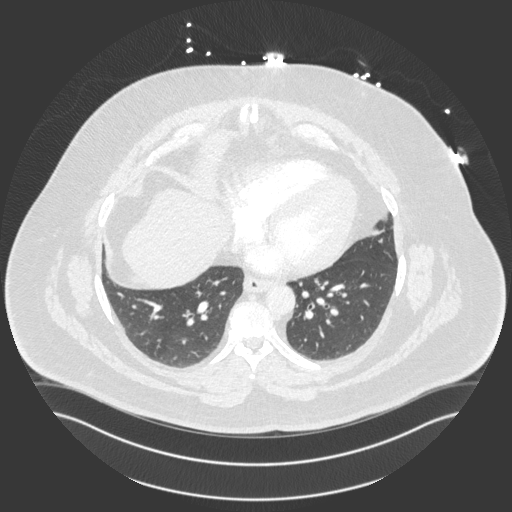
[im 256/460  soft-tissue]
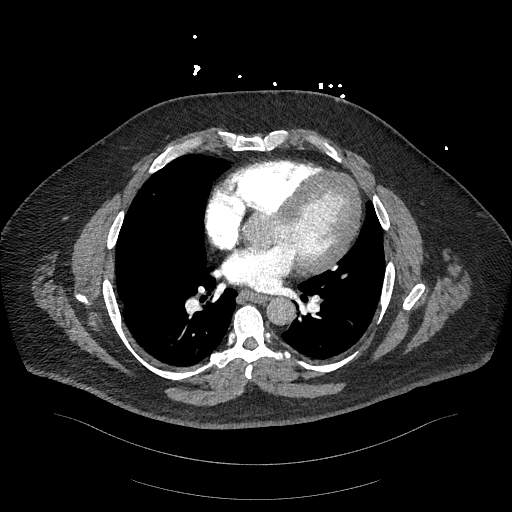
[im 281/460  lung]
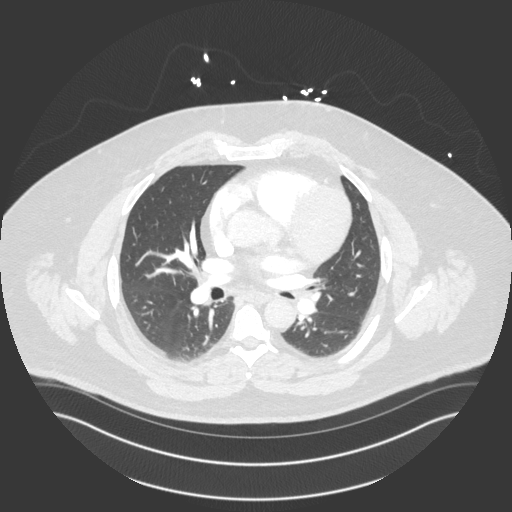
[im 307/460  soft-tissue]
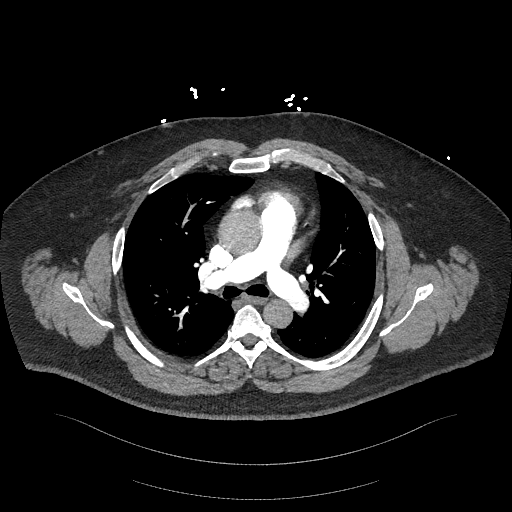
[im 332/460  lung]
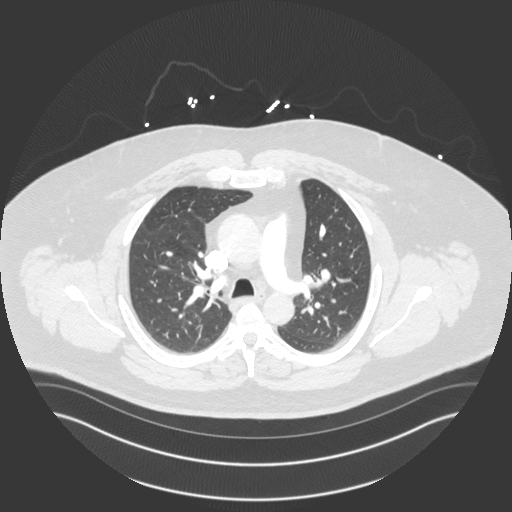
[im 358/460  soft-tissue]
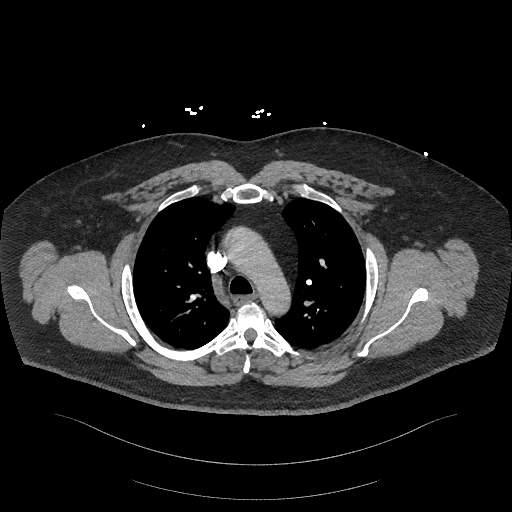
[im 409/460  lung]
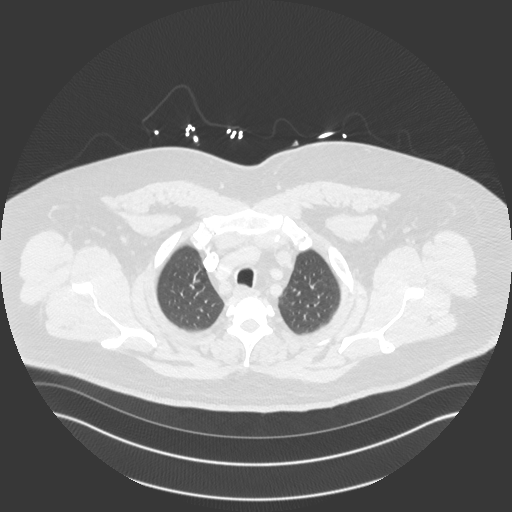
[im 434/460  soft-tissue]
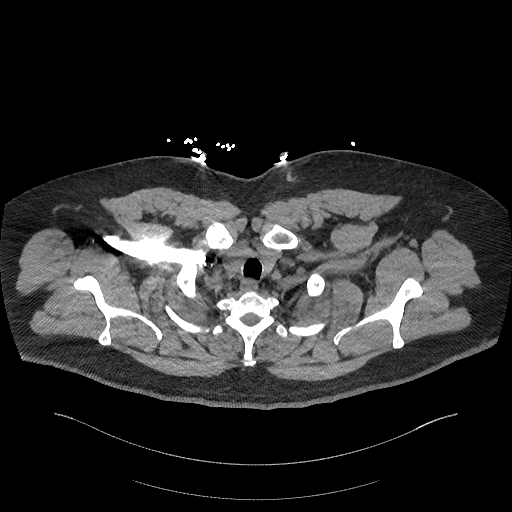

[Series 8: cor · coronal · 0.65mm/px · 3 of 212 slices shown]
[im 53/212  soft-tissue]
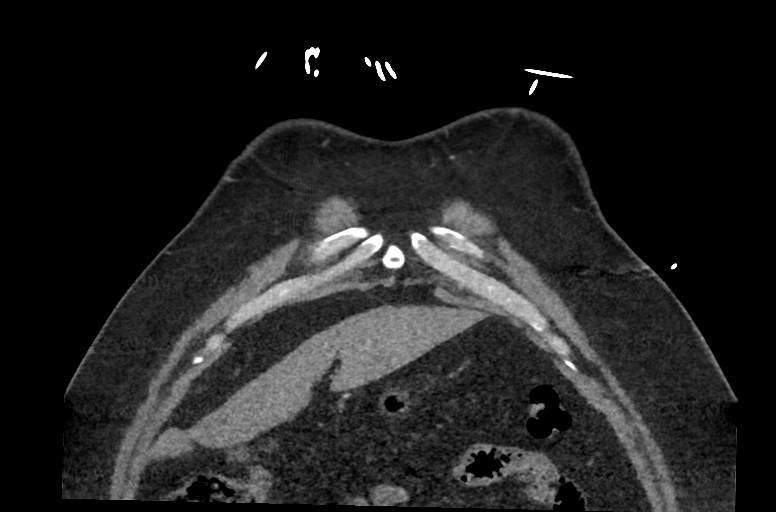
[im 106/212  soft-tissue]
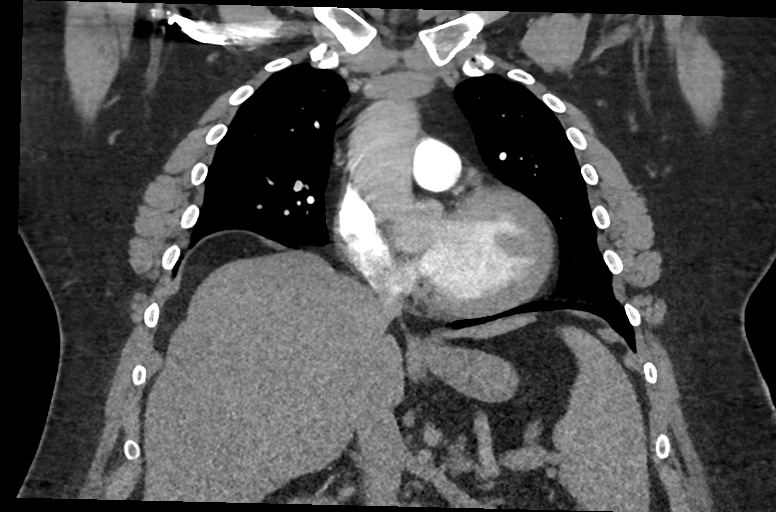
[im 159/212  soft-tissue]
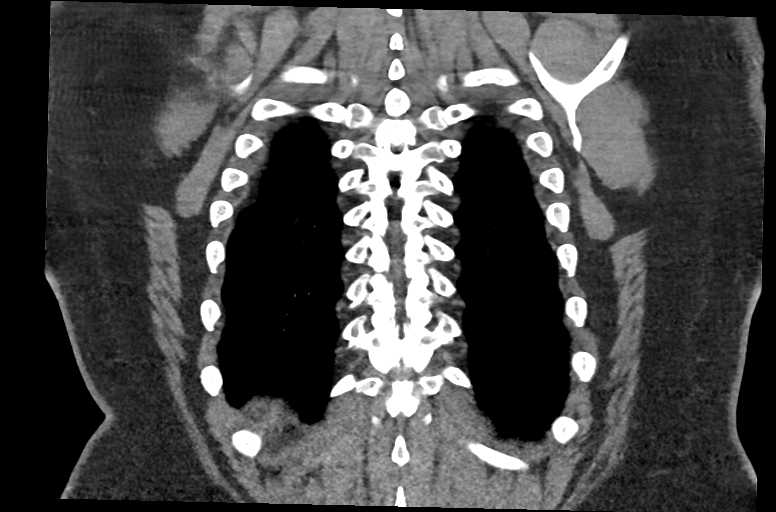

[17 of 46 positions shown; findings below may reference images not displayed]

FINDINGS: Cardiovascular: The contrast bolus within the main pulmonary artery
is adequate. However, evaluation of the segmental branches is
limited due to mixing artifact, primarily within the left upper
lobe, left lower lobe, and right lower lobe. I do not see any
filling defects or pulmonary emboli within the opacified portions of
the pulmonary artery.

The heart is unremarkable without pericardial effusion. Mild
coronary artery atherosclerosis.

Mediastinum/Nodes: There is a 12 mm short axis right paratracheal
lymph node, nonspecific. No other pathologic adenopathy. Thyroid,
trachea, and esophagus are normal.

Lungs/Pleura: No airspace disease, effusion, or pneumothorax.
Central airways are patent.

Upper Abdomen: No acute abnormality.

Musculoskeletal: No acute or destructive bony lesions. Reconstructed
images demonstrate no additional findings.

Review of the MIP images confirms the above findings.
IMPRESSION: 1. Technically limited evaluation of the pulmonary vasculature due
to contrast bolus and mixing artifact within the segmental branches
of the left upper, left lower, and right lower lobes as above.
Within the opacified portions of the vascular tree, there are no
filling defects or pulmonary emboli.
2. Nonspecific 12 mm right paratracheal lymph node.
3. Otherwise no acute intrathoracic process.

## 2020-12-18 IMAGING — DX DG CHEST 1V PORT
1 series · 1 of 1 positions shown · non-contrast
Comparison: CT 05/19/2020, radiograph 05/19/2020

CLINICAL DATA: Chest pain which began at 4 p.m. yesterday, since
subsided

EXAM:
PORTABLE CHEST 1 VIEW

[chest]
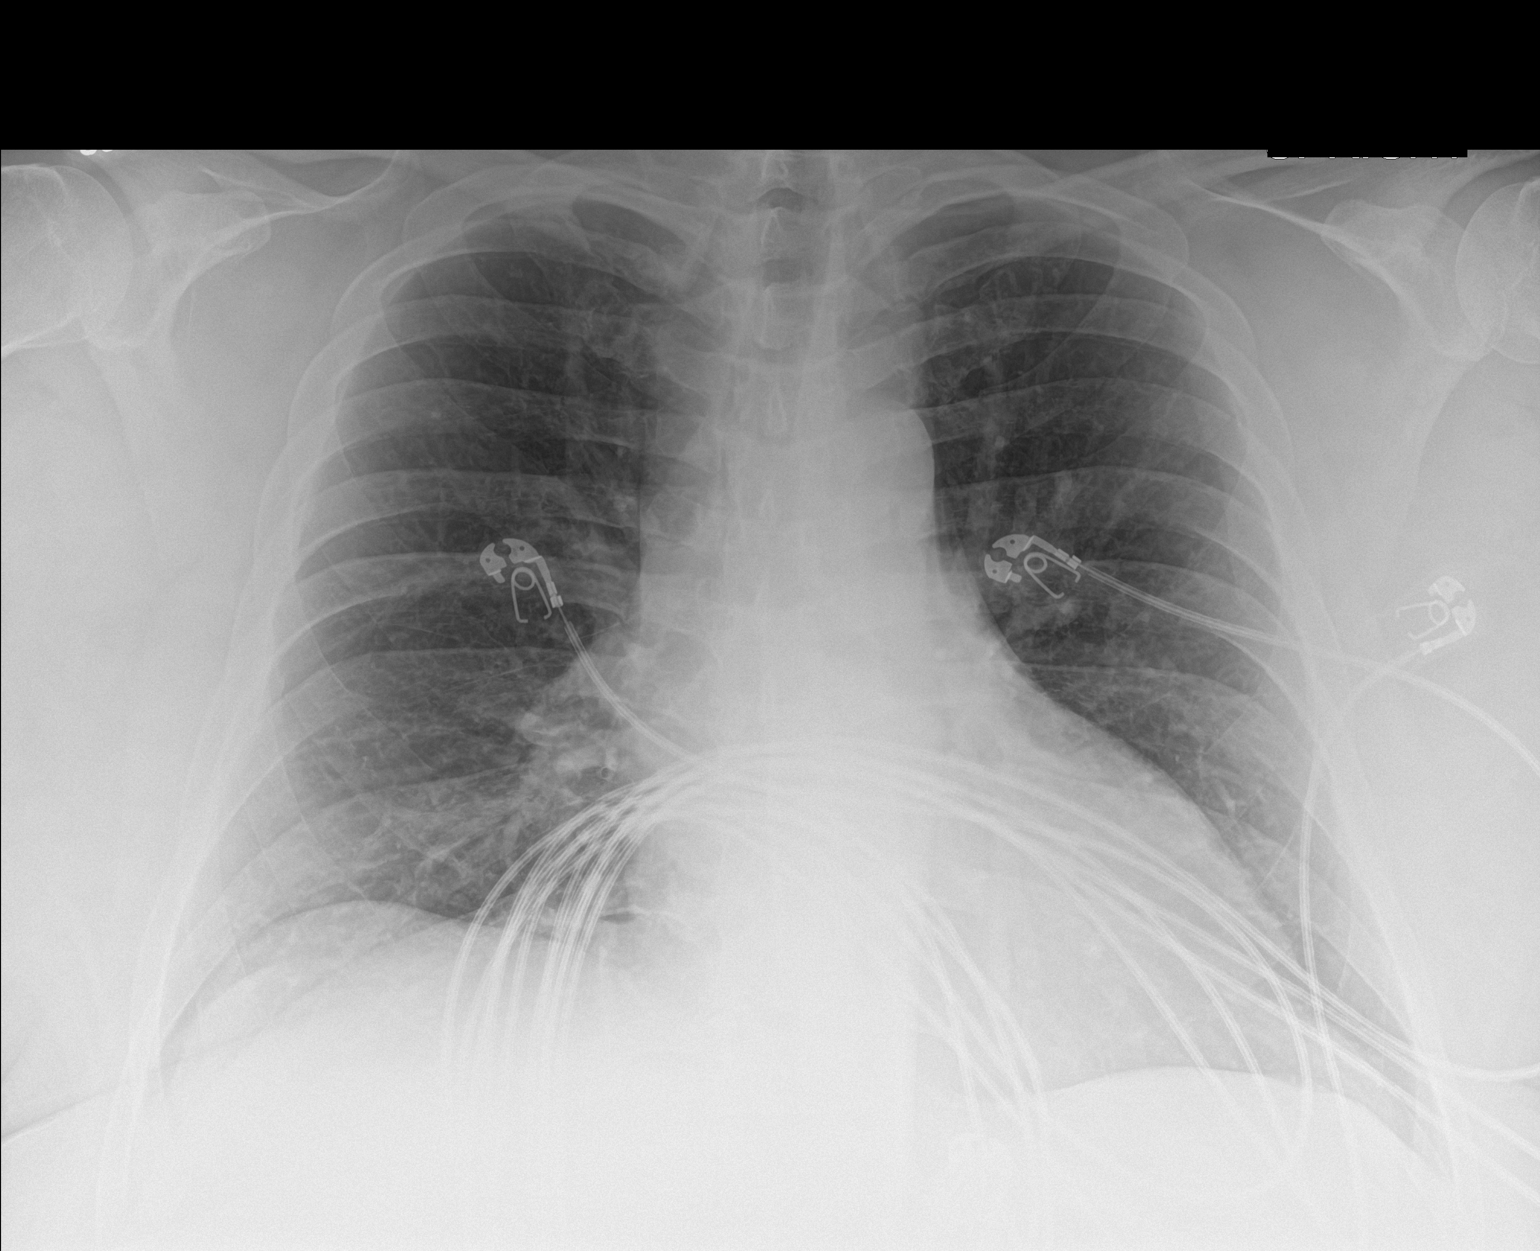

[1 of 1 positions shown; findings below may reference images not displayed]

FINDINGS: Mild streaky basilar atelectatic changes. Lungs are otherwise clear.
Stable cardiomediastinal contours accounting for differences in
technique. No pneumothorax or visible effusion. No acute osseous or
soft tissue abnormality. Degenerative changes are present in the
imaged spine and shoulders. Telemetry leads overlie the chest.
IMPRESSION: Mild streaky basilar atelectatic changes. No other acute
cardiopulmonary abnormality.

## 2021-01-09 ENCOUNTER — Ambulatory Visit: Payer: BC Managed Care – PPO | Admitting: Cardiology

## 2021-01-09 ENCOUNTER — Encounter: Payer: Self-pay | Admitting: Cardiology

## 2021-01-09 ENCOUNTER — Other Ambulatory Visit: Payer: Self-pay

## 2021-01-09 ENCOUNTER — Telehealth: Payer: Self-pay | Admitting: Cardiology

## 2021-01-09 VITALS — BP 137/80 | HR 76 | Temp 98.3°F | Resp 12 | Ht 68.0 in | Wt 334.6 lb

## 2021-01-09 DIAGNOSIS — I10 Essential (primary) hypertension: Secondary | ICD-10-CM | POA: Diagnosis not present

## 2021-01-09 DIAGNOSIS — I25118 Atherosclerotic heart disease of native coronary artery with other forms of angina pectoris: Secondary | ICD-10-CM | POA: Diagnosis not present

## 2021-01-09 DIAGNOSIS — E7800 Pure hypercholesterolemia, unspecified: Secondary | ICD-10-CM | POA: Diagnosis not present

## 2021-01-09 DIAGNOSIS — G4733 Obstructive sleep apnea (adult) (pediatric): Secondary | ICD-10-CM

## 2021-01-09 NOTE — Telephone Encounter (Signed)
Anytime is okay, probably a week or before is fine so you will have them and we both can text/exchange thoughts

## 2021-01-09 NOTE — Telephone Encounter (Signed)
Hi Dr Jacinto Halim. I am happy to put them in now. He has an appointment with me on 3/21. Did you need them sooner or is that date ok?  Katina Degree. [name], MD 01/09/2021 3:53 PM

## 2021-01-09 NOTE — Progress Notes (Signed)
Primary Physician/Referring:  Ardith DarkParker, Caleb M, MD  Patient ID: Michael AlexandriaEric L Sweaney, male    DOB: 03-Oct-1966, 55 y.o.   MRN: [id number]  Chief Complaint  Patient presents with  . Coronary Artery Disease  . Hypertension  . Follow-up    6 months   HPI:    Michael [name]  is a 55 y.o. Caucasian male with hypertension, hyperlipidemia, obesity and obstructive sleep apnea on CPAP, presented to the emergency room on 05/19/2020 with central chest tightness with radiation to the left shoulder and jaw.  He was positive for non-ST elevation myocardial infarction, underwent cardiac catheterization the following day and successful angioplasty to the right coronary artery.    This is 6639-month office visit, accompanied by his wife, he has no specific complaints.  States that he has been walking at workplace for at least 3 to 5 miles a day without any chest pain or dyspnea.  He was recently retested for sleep apnea and is awaiting a new machine but has been using the old machine regularly.  Past Medical History:  Diagnosis Date  . (HFpEF) heart failure with preserved ejection fraction (HCC)    a. 05/2020 Echo: EF 60%, no rwma, Gr2 DD. Triv MR.  Marland Kitchen. CAD (coronary artery disease)    a. 04/2020 NSTEMI/PCI: LM nl, LAD nl, LCX nl, RCA 3175m, 85d (3.6x26 Resolute Onyx DES), RPDA nl, RPAV nl, EF 50-55%.  Marland Kitchen. Hyperlipidemia LDL goal <70 08/28/2018  . Obesity hypoventilation syndrome (HCC)   . Primary hypertension 08/28/2018  . Sleep apnea   . Sleep apnea with use of continuous positive airway pressure (CPAP) 08/27/2013  . Viral meningitis    Past Surgical History:  Procedure Laterality Date  . CARDIAC CATHETERIZATION    . CORONARY STENT INTERVENTION N/A 05/20/2020   Procedure: CORONARY STENT INTERVENTION;  Surgeon: Yvonne KendallEnd, Christopher, MD;  Location: [facility];  Service: Cardiovascular;  Laterality: N/A;  . LEFT HEART CATH AND CORONARY ANGIOGRAPHY N/A 05/20/2020   Procedure: LEFT HEART CATH AND CORONARY ANGIOGRAPHY;   Surgeon: Yvonne KendallEnd, Christopher, MD;  Location: [facility];  Service: Cardiovascular;  Laterality: N/A;   Family History  Problem Relation Age of Onset  . Alzheimer's disease Mother     Social History   Tobacco Use  . Smoking status: Never Smoker  . Smokeless tobacco: Never Used  Substance Use Topics  . Alcohol use: Yes    Comment: rarely   Marital Status: Divorced  ROS  Review of Systems  Constitutional: Negative for malaise/fatigue.  Cardiovascular: Negative for chest pain, dyspnea on exertion and leg swelling.  Gastrointestinal: Negative for melena.   Objective  Blood pressure 137/80, pulse 76, temperature 98.3 F (36.8 C), temperature source Temporal, resp. rate 12, height 5\' 8"  (1.727 m), weight (!) 334 lb 9.6 oz (151.8 kg), SpO2 98 %.  Vitals with BMI 01/09/2021 08/31/2020 08/16/2020  Height 5\' 8"  5' 8.75" 5\' 9"   Weight 334 lbs 10 oz 331 lbs 13 oz 327 lbs  BMI 50.89 49.37 48.27  Systolic 137 - 126  Diastolic 80 - 78  Pulse 76 - -     Physical Exam Constitutional:      Comments: He is well-built and morbidly obese in no acute distress.  Cardiovascular:     Rate and Rhythm: Normal rate and regular rhythm.     Pulses: Intact distal pulses.     Heart sounds: Normal heart sounds. No murmur heard. No gallop.      Comments: No leg  edema, no JVD. Pulmonary:     Effort: Pulmonary effort is normal.     Breath sounds: Normal breath sounds.  Abdominal:     General: Bowel sounds are normal.     Palpations: Abdomen is soft.     Comments: Pannus present    Laboratory examination:   Recent Labs    05/19/20 1639 05/21/20 0701  NA 140 138  K 3.8 3.8  CL 106 106  CO2 24 22  GLUCOSE 109* 103*  BUN 14 9  CREATININE 0.93 0.94  CALCIUM 9.5 9.0  GFRNONAA >60 >60  GFRAA >60 >60   CrCl cannot be calculated (Patient's most recent lab result is older than the maximum 21 days allowed.).  CMP Latest Ref Rng & Units 05/21/2020 05/19/2020 04/08/2020  Glucose 70 - 99 mg/dL  268(T) 419(Q) -  BUN 6 - 20 mg/dL 9 14 -  Creatinine 2.22 - 1.24 mg/dL 9.79 8.92 -  Sodium 119 - 145 mmol/L 138 140 -  Potassium 3.5 - 5.1 mmol/L 3.8 3.8 -  Chloride 98 - 111 mmol/L 106 106 -  CO2 22 - 32 mmol/L 22 24 -  Calcium 8.9 - 10.3 mg/dL 9.0 9.5 -  Total Protein 6.0 - 8.5 g/dL - 6.6 7.4  Total Bilirubin 0.0 - 1.2 mg/dL - 0.5 0.5  Alkaline Phos 48 - 121 IU/L - 68 71  AST 0 - 40 IU/L - 28 26  ALT 0 - 44 IU/L - 36 31   CBC Latest Ref Rng & Units 05/21/2020 05/19/2020  WBC 4.0 - 10.5 K/uL 8.9 8.1  Hemoglobin 13.0 - 17.0 g/dL 41.7 40.8  Hematocrit 14.4 - 52.0 % 44.4 47.5  Platelets 150 - 400 K/uL 175 188   Lipid Panel Recent Labs    05/20/20 0150  CHOL 229*  TRIG 155*  LDLCALC 163*  VLDL 31  HDL 35*  CHOLHDL 6.5    HEMOGLOBIN A1C Lab Results  Component Value Date   HGBA1C 5.3 05/19/2020   MPG 105.41 05/19/2020   TSH Recent Labs    05/20/20 0159  TSH 4.002   Medications and allergies  No Known Allergies  Current Outpatient Medications on File Prior to Visit  Medication Sig Dispense Refill  . acitretin (SORIATANE) 25 MG capsule Take 25 mg by mouth daily before breakfast.    . aspirin 81 MG EC tablet Take 1 tablet (81 mg total) by mouth daily. Swallow whole. 30 tablet 6  . atorvastatin (LIPITOR) 80 MG tablet Take 1 tablet (80 mg total) by mouth at bedtime. 30 tablet 6  . bumetanide (BUMEX) 0.5 MG tablet TAKE 1 TABLET (0.5 MG TOTAL) BY MOUTH DAILY. 90 tablet 0  . furosemide (LASIX) 20 MG tablet Take 1 tablet (20 mg total) by mouth daily as needed for fluid (Leg swelling). 30 tablet 6  . metoprolol tartrate (LOPRESSOR) 25 MG tablet Take 1 tablet (25 mg total) by mouth 2 (two) times daily. 60 tablet 6  . Multiple Vitamin (MULTIVITAMIN) capsule Take 1 capsule by mouth daily.    . nitroGLYCERIN (NITROSTAT) 0.4 MG SL tablet Place 1 tablet (0.4 mg total) under the tongue every 5 (five) minutes x 3 doses as needed for chest pain. 25 tablet 3  . prasugrel (EFFIENT) 10 MG  TABS tablet Take 1 tablet (10 mg total) by mouth daily. 30 tablet 6  . testosterone cypionate (DEPOTESTOSTERONE CYPIONATE) 200 MG/ML injection Inject 1 mL (200 mg total) into the muscle every 14 (fourteen) days. 10 mL 0  .  valsartan (DIOVAN) 160 MG tablet Take 1 tablet (160 mg total) by mouth daily. 90 tablet 3   No current facility-administered medications on file prior to visit.    Radiology:   No results found.  Cardiac Studies:   Coronary angiogram 05/20/2020 1. Severe single-vessel coronary artery disease with sequential 40-50% mid and 80-90% distal RCA stenoses.  The distal RCA lesion appears ulcerated and is likely the culprit lesion for the patient's NSTEMI. 2. No angiographically significant coronary artery disease involving the left coronary artery. 3. Low normal left ventricular systolic function with basal and mid inferior hypokinesis.  Left ventricular filling pressure is moderately elevated (LVEDP ~30 mmHg). 4. Successful PCI to the distal RCA using Resolute Onyx 3.5 x 26 mm drug-eluting stent (postdilated to 4.1 mm) with 0% residual stenosis and TIMI-3 flow.  Echocardiogram 05/21/2020:  1. Left ventricular ejection fraction, by estimation, is 60%. The left  ventricle has normal function. The left ventricle has no regional wall  motion abnormalities. There is moderate left ventricular hypertrophy. Left  ventricular diastolic parameters are consistent with Grade II diastolic dysfunction (pseudonormalization).  2. Right ventricular systolic function is normal. The right ventricular  size is normal. Tricuspid regurgitation signal is inadequate for assessing  PA pressure.  3. The mitral valve is grossly normal. Trivial mitral valve  regurgitation. No evidence of mitral stenosis.  4. The aortic valve is tricuspid. Aortic valve regurgitation is not visualized. No aortic stenosis is present.  5. The inferior vena cava is dilated in size with >50% respiratory  variability, suggesting  right atrial pressure of 8 mmHg.  EKG  EKG 01/09/2021: Normal sinus rhythm at rate of 70 bpm, left atrial enlargement, normal axis.  Nonspecific T abnormality in the inferior and lateral leads.  No significant change from 05/26/2020.    Assessment     ICD-10-CM   1. Coronary artery disease of native artery of native heart with stable angina pectoris (HCC)  I25.118 EKG 12-Lead    Amb Referral to Bariatric Surgery  2. Primary hypertension  I10   3. Hypercholesteremia  E78.00   4. Class 3 severe obesity due to excess calories with serious comorbidity and body mass index (BMI) of 50.0 to 59.9 in adult [facility])  E66.01 Amb Referral to Bariatric Surgery   Z68.43   5. OSA on CPAP  G47.33    Z99.89   No orders of the defined types were placed in this encounter.  Medications Discontinued During This Encounter  Medication Reason  . methotrexate (RHEUMATREX) 2.5 MG tablet Error  . testosterone (ANDROGEL) 50 MG/5GM (1%) GEL Error   Orders Placed This Encounter  Procedures  . Amb Referral to Bariatric Surgery    Referral Priority:   Routine    Referral Type:   Consultation    Referral Reason:   Specialty Services Required    Referred to Provider:   Surgery, [place]    Number of Visits Requested:   1  . EKG 12-Lead     Recommendations:   IOSEFA WEINTRAUB  is a 55 y.o. Caucasian male with hypertension, hyperlipidemia, obesity and obstructive sleep apnea on CPAP, presented to the emergency room on 05/19/2020 with central chest tightness with radiation to the left shoulder and jaw.  He was positive for non-ST elevation myocardial infarction, underwent cardiac catheterization the following day and successful angioplasty to the right coronary artery.    He has been compliant with his CPAP, blood pressures well controlled.  He does need lipid evaluation, I  have requested his PCP Dr. Jacquiline Doe to place lab orders as he has complete physical examination coming up soon..  Fortunately has not had  any recurrence of angina pectoris and there is no clinical evidence heart failure, EKG does not reveal any significant abnormalities either.  No tests were ordered for me.  I made a referral for bariatric surgical evaluation.  He has had difficulty in losing weight in spite of trying multiple different diets.  Is accompanied by his wife today.  Stable from cardiac standpoint, I will see him back in 6 months for follow-up. Will discontinue Prasugrel on his next visit. He is tolerating max dose Atorva and need to f/u on lipids.   Referral placed for bariatric team evaluation.   Yates Decamp, MD, [facility] 01/09/2021, 12:06 PM Office: [phone]

## 2021-01-10 ENCOUNTER — Other Ambulatory Visit: Payer: Self-pay | Admitting: *Deleted

## 2021-01-10 DIAGNOSIS — I25118 Atherosclerotic heart disease of native coronary artery with other forms of angina pectoris: Secondary | ICD-10-CM

## 2021-01-10 DIAGNOSIS — E785 Hyperlipidemia, unspecified: Secondary | ICD-10-CM

## 2021-01-10 NOTE — Telephone Encounter (Signed)
Lab placed Patient notified and lab appointment schedule

## 2021-01-10 NOTE — Telephone Encounter (Signed)
Hi Team. Can we put in labs for the patient and let him know to come in before his appointment to get them done? Would like to check CBC, CMET, TSH, A1c, and lipid panel.  Katina Degree. [name], MD 01/10/2021 8:46 AM

## 2021-01-23 ENCOUNTER — Other Ambulatory Visit: Payer: BC Managed Care – PPO

## 2021-01-25 ENCOUNTER — Other Ambulatory Visit (INDEPENDENT_AMBULATORY_CARE_PROVIDER_SITE_OTHER): Payer: BC Managed Care – PPO

## 2021-01-25 ENCOUNTER — Other Ambulatory Visit: Payer: Self-pay

## 2021-01-25 DIAGNOSIS — E785 Hyperlipidemia, unspecified: Secondary | ICD-10-CM

## 2021-01-25 DIAGNOSIS — R7989 Other specified abnormal findings of blood chemistry: Secondary | ICD-10-CM

## 2021-01-25 DIAGNOSIS — I25118 Atherosclerotic heart disease of native coronary artery with other forms of angina pectoris: Secondary | ICD-10-CM | POA: Diagnosis not present

## 2021-01-25 LAB — COMPREHENSIVE METABOLIC PANEL
ALT: 23 U/L (ref 0–53)
AST: 20 U/L (ref 0–37)
Albumin: 4.2 g/dL (ref 3.5–5.2)
Alkaline Phosphatase: 46 U/L (ref 39–117)
BUN: 22 mg/dL (ref 6–23)
CO2: 27 mEq/L (ref 19–32)
Calcium: 9.3 mg/dL (ref 8.4–10.5)
Chloride: 104 mEq/L (ref 96–112)
Creatinine, Ser: 0.99 mg/dL (ref 0.40–1.50)
GFR: 86.69 mL/min (ref >60.00)
Glucose, Bld: 81 mg/dL (ref 70–99)
Potassium: 4.4 mEq/L (ref 3.5–5.1)
Sodium: 140 mEq/L (ref 135–145)
Total Bilirubin: 0.7 mg/dL (ref 0.2–1.2)
Total Protein: 6.9 g/dL (ref 6.0–8.3)

## 2021-01-25 LAB — LIPID PANEL
Cholesterol: 118 mg/dL (ref 0–200)
HDL: 31.9 mg/dL — ABNORMAL LOW (ref >39.00)
LDL Cholesterol: 66 mg/dL (ref 0–99)
NonHDL: 86.34
Total CHOL/HDL Ratio: 4
Triglycerides: 104 mg/dL (ref 0.0–149.0)
VLDL: 20.8 mg/dL (ref 0.0–40.0)

## 2021-01-25 LAB — CBC
HCT: 48.8 % (ref 39.0–52.0)
Hemoglobin: 16 g/dL (ref 13.0–17.0)
MCHC: 32.9 g/dL (ref 30.0–36.0)
MCV: 82.4 fl (ref 78.0–100.0)
Platelets: 150 10*3/uL (ref 150.0–400.0)
RBC: 5.92 Mil/uL — ABNORMAL HIGH (ref 4.22–5.81)
RDW: 16.1 % — ABNORMAL HIGH (ref 11.5–15.5)
WBC: 7.4 10*3/uL (ref 4.0–10.5)

## 2021-01-25 LAB — TESTOSTERONE: Testosterone: 732.75 ng/dL (ref 300.00–890.00)

## 2021-01-25 LAB — TSH: TSH: 2.34 u[IU]/mL (ref 0.35–4.50)

## 2021-01-25 LAB — HEMOGLOBIN A1C: Hgb A1c MFr Bld: 5.5 % (ref 4.6–6.5)

## 2021-01-25 NOTE — Addendum Note (Signed)
Addended by: Vincenza Hews on: 01/25/2021 08:45 AM   Modules accepted: Orders

## 2021-01-26 ENCOUNTER — Telehealth (INDEPENDENT_AMBULATORY_CARE_PROVIDER_SITE_OTHER): Payer: BC Managed Care – PPO | Admitting: Family Medicine

## 2021-01-26 DIAGNOSIS — R062 Wheezing: Secondary | ICD-10-CM

## 2021-01-26 DIAGNOSIS — R059 Cough, unspecified: Secondary | ICD-10-CM | POA: Diagnosis not present

## 2021-01-26 MED ORDER — BENZONATATE 100 MG PO CAPS: 100.0000 mg | ORAL_CAPSULE | Freq: Three times a day (TID) | ORAL | 0 refills | Status: DC | PRN

## 2021-01-26 MED ORDER — ALBUTEROL SULFATE HFA 108 (90 BASE) MCG/ACT IN AERS: 2.0000 | INHALATION_SPRAY | Freq: Four times a day (QID) | RESPIRATORY_TRACT | 0 refills | Status: DC | PRN

## 2021-01-26 NOTE — Progress Notes (Signed)
Virtual Visit via Video Note  I connected with Michael [name]  on 01/26/21 at  5:00 PM EST by a video enabled telemedicine application and verified that I am speaking with the correct person using two identifiers.  Location patient: home, [place] Location provider:work or home office Persons participating in the virtual visit: patient, provider  I discussed the limitations of evaluation and management by telemedicine and the availability of in person appointments. The patient expressed understanding and agreed to proceed.   HPI:  Acute telemedicine visit for nasal congestion, sore throat: -Onset: yesterday, did covid test yesterday and it was negative - [facility] -Symptoms include: nasal congestion, cough, sore throat, fatigue, wheezing a little -Denies: CP, SOB, NVD, fever, loss of taste or smell, inability to eat/drink/get out of bed, known sick contacts -Has tried: throat coat, nyquil -Pertinent past medical history: heart disease, obesity - reports has needed an inhaler at times when was wheezing -Pertinent medication allergies: nkda -COVID-19 vaccine status: has had 2 doses of covid vaccine   ROS: See pertinent positives and negatives per HPI.  Past Medical History:  Diagnosis Date  . (HFpEF) heart failure with preserved ejection fraction (HCC)    a. 05/2020 Echo: EF 60%, no rwma, Gr2 DD. Triv MR.  Marland Kitchen CAD (coronary artery disease)    a. 04/2020 NSTEMI/PCI: LM nl, LAD nl, LCX nl, RCA 64m, 85d (3.6x26 Resolute Onyx DES), RPDA nl, RPAV nl, EF 50-55%.  Marland Kitchen Hyperlipidemia LDL goal <70 08/28/2018  . Obesity hypoventilation syndrome (HCC)   . Primary hypertension 08/28/2018  . Sleep apnea   . Sleep apnea with use of continuous positive airway pressure (CPAP) 08/27/2013  . Viral meningitis     Past Surgical History:  Procedure Laterality Date  . CARDIAC CATHETERIZATION    . CORONARY STENT INTERVENTION N/A 05/20/2020   Procedure: CORONARY STENT INTERVENTION;  Surgeon: Yvonne Kendall, MD;  Location: [facility]  INVASIVE CV LAB;  Service: Cardiovascular;  Laterality: N/A;  . LEFT HEART CATH AND CORONARY ANGIOGRAPHY N/A 05/20/2020   Procedure: LEFT HEART CATH AND CORONARY ANGIOGRAPHY;  Surgeon: Yvonne Kendall, MD;  Location: [facility] INVASIVE CV LAB;  Service: Cardiovascular;  Laterality: N/A;     Current Outpatient Medications:  .  albuterol (PROAIR HFA) 108 (90 Base) MCG/ACT inhaler, Inhale 2 puffs into the lungs every 6 (six) hours as needed for wheezing or shortness of breath., Disp: 1 each, Rfl: 0 .  benzonatate (TESSALON PERLES) 100 MG capsule, Take 1 capsule (100 mg total) by mouth 3 (three) times daily as needed., Disp: 20 capsule, Rfl: 0 .  acitretin (SORIATANE) 25 MG capsule, Take 25 mg by mouth daily before breakfast., Disp: , Rfl:  .  aspirin 81 MG EC tablet, Take 1 tablet (81 mg total) by mouth daily. Swallow whole., Disp: 30 tablet, Rfl: 6 .  atorvastatin (LIPITOR) 80 MG tablet, Take 1 tablet (80 mg total) by mouth at bedtime., Disp: 30 tablet, Rfl: 6 .  bumetanide (BUMEX) 0.5 MG tablet, TAKE 1 TABLET (0.5 MG TOTAL) BY MOUTH DAILY., Disp: 90 tablet, Rfl: 0 .  furosemide (LASIX) 20 MG tablet, Take 1 tablet (20 mg total) by mouth daily as needed for fluid (Leg swelling)., Disp: 30 tablet, Rfl: 6 .  metoprolol tartrate (LOPRESSOR) 25 MG tablet, Take 1 tablet (25 mg total) by mouth 2 (two) times daily., Disp: 60 tablet, Rfl: 6 .  Multiple Vitamin (MULTIVITAMIN) capsule, Take 1 capsule by mouth daily., Disp: , Rfl:  .  nitroGLYCERIN (NITROSTAT) 0.4 MG SL tablet, Place 1  tablet (0.4 mg total) under the tongue every 5 (five) minutes x 3 doses as needed for chest pain., Disp: 25 tablet, Rfl: 3 .  prasugrel (EFFIENT) 10 MG TABS tablet, Take 1 tablet (10 mg total) by mouth daily., Disp: 30 tablet, Rfl: 6 .  testosterone cypionate (DEPOTESTOSTERONE CYPIONATE) 200 MG/ML injection, Inject 1 mL (200 mg total) into the muscle every 14 (fourteen) days., Disp: 10 mL, Rfl: 0 .  valsartan (DIOVAN) 160 MG tablet, Take  1 tablet (160 mg total) by mouth daily., Disp: 90 tablet, Rfl: 3  EXAM:  VITALS per patient if applicable:  GENERAL: alert, oriented, appears well and in no acute distress  HEENT: atraumatic, conjunttiva clear, no obvious abnormalities on inspection of external nose and ears  NECK: normal movements of the head and neck  LUNGS: on inspection no signs of respiratory distress, breathing rate appears normal, no obvious gross SOB, gasping or wheezing  CV: no obvious cyanosis  MS: moves all visible extremities without noticeable abnormality  PSYCH/NEURO: pleasant and cooperative, no obvious depression or anxiety, speech and thought processing grossly intact  ASSESSMENT AND PLAN:  Discussed the following assessment and plan:  Cough  Wheezing  -we discussed possible serious and likely etiologies, options for evaluation and workup, limitations of telemedicine visit vs in person visit, treatment, treatment risks and precautions. Pt prefers to treat via telemedicine empirically rather than in person at this moment.  Query viral upper respiratory illness, possible COVID-19 with false negative early testing, influenza versus other.  Discussed options for evaluation, treatment, potential complications and precautions.  He opted for Tessalon for cough, albuterol if needed for wheezing and other home care measures summarized in patient instructions.  Agrees to check another Covid test, or if any worsening seek in person care.  Work/School slipped offered: provided in patient instructions   Scheduled follow up with PCP offered: Agrees to schedule follow-up if needed Advised to seek prompt in person care if worsening, new symptoms arise, or if is not improving with treatment. Discussed options for inperson care if PCP office not available. Did let this patient know that I only do telemedicine on Tuesdays and Thursdays for [place]. Advised to schedule follow up visit with PCP or [facility] if any further  questions or concerns to avoid delays in care.   I discussed the assessment and treatment plan with the patient. The patient was provided an opportunity to ask questions and all were answered. The patient agreed with the plan and demonstrated an understanding of the instructions.     Terressa Koyanagi, DO

## 2021-01-26 NOTE — Patient Instructions (Addendum)
   ---------------------------------------------------------------------------------------------------------------------------      WORK SLIP:  Patient Michael [name],  11-09-1966, was seen for a medical visit today, 01/26/21 . Please excuse from work for a COVID like illness. We advise 10 days minimum from the onset of symptoms (01/25/21) PLUS 1 day of no fever and improved symptoms. Will defer to employer for a sooner return to work if covid testing is negative AND symptoms have resolved OR symptoms have resolved and it is greater than 5 days since the positive test and the patient can wear a high-quality, tight fitting mask such as N95 or KN95 at all times for an additional 5 days. Would also suggest COVID19 antigen testing is negative prior to return.  Sincerely: E-signature: Dr. Kriste Basque, DO [facility] Ph: [phone]   ------------------------------------------------------------------------------------------------------------------------------   -I sent the medication(s) we discussed to your pharmacy: Meds ordered this encounter  Medications  . benzonatate (TESSALON PERLES) 100 MG capsule    Sig: Take 1 capsule (100 mg total) by mouth 3 (three) times daily as needed.    Dispense:  20 capsule    Refill:  0  . albuterol (PROAIR HFA) 108 (90 Base) MCG/ACT inhaler    Sig: Inhale 2 puffs into the lungs every 6 (six) hours as needed for wheezing or shortness of breath.    Dispense:  1 each    Refill:  0   Recheck Covid test on day 3 or 4.  If you have a positive Covid test, please schedule a follow-up video visit with with your primary care office or through [facility] health if you wish to discuss treatment options.  Staying home while sick, and up for full 10 days if you have Covid.  Can try nasal saline as well if needed.  Stay hydrated and drink plenty of fluids.  I hope you are feeling better soon!  Seek in person care promptly if your symptoms worsen,  new concerns arise or you are not improving with treatment.  It was nice to meet you today. I help [facility] out with telemedicine visits on Tuesdays and Thursdays and am available for visits on those days. If you have any concerns or questions following this visit please schedule a follow up visit with your Primary Care doctor or seek care at a local urgent care clinic to avoid delays in care.

## 2021-01-27 DIAGNOSIS — Z20822 Contact with and (suspected) exposure to covid-19: Secondary | ICD-10-CM | POA: Diagnosis not present

## 2021-01-27 DIAGNOSIS — Z03818 Encounter for observation for suspected exposure to other biological agents ruled out: Secondary | ICD-10-CM | POA: Diagnosis not present

## 2021-01-27 NOTE — Progress Notes (Signed)
Please inform patient of the following:  Labs are all stable. We can discuss more at his upcoming appointment.  Katina Degree. [name], MD 01/27/2021 4:09 PM

## 2021-02-06 ENCOUNTER — Encounter: Payer: BC Managed Care – PPO | Admitting: Family Medicine

## 2021-02-21 ENCOUNTER — Other Ambulatory Visit: Payer: Self-pay | Admitting: Family Medicine

## 2021-02-21 DIAGNOSIS — L301 Dyshidrosis [pompholyx]: Secondary | ICD-10-CM | POA: Diagnosis not present

## 2021-02-21 DIAGNOSIS — Z79899 Other long term (current) drug therapy: Secondary | ICD-10-CM | POA: Diagnosis not present

## 2021-02-21 NOTE — Telephone Encounter (Signed)
Last Refill 01/22/2021 Last OV 08/16/2020 (video visit) dx coronary artery disease

## 2021-03-02 ENCOUNTER — Encounter: Payer: BC Managed Care – PPO | Admitting: Family Medicine

## 2021-03-08 ENCOUNTER — Other Ambulatory Visit: Payer: Self-pay | Admitting: Cardiology

## 2021-03-08 DIAGNOSIS — I10 Essential (primary) hypertension: Secondary | ICD-10-CM

## 2021-04-20 ENCOUNTER — Other Ambulatory Visit: Payer: Self-pay

## 2021-04-20 ENCOUNTER — Encounter: Payer: Self-pay | Admitting: Family Medicine

## 2021-04-20 ENCOUNTER — Ambulatory Visit (INDEPENDENT_AMBULATORY_CARE_PROVIDER_SITE_OTHER): Payer: BC Managed Care – PPO | Admitting: Family Medicine

## 2021-04-20 VITALS — BP 131/81 | HR 65 | Temp 98.1°F | Ht 68.0 in | Wt 331.8 lb

## 2021-04-20 DIAGNOSIS — Z0001 Encounter for general adult medical examination with abnormal findings: Secondary | ICD-10-CM

## 2021-04-20 DIAGNOSIS — G4700 Insomnia, unspecified: Secondary | ICD-10-CM

## 2021-04-20 DIAGNOSIS — R7989 Other specified abnormal findings of blood chemistry: Secondary | ICD-10-CM

## 2021-04-20 DIAGNOSIS — I10 Essential (primary) hypertension: Secondary | ICD-10-CM | POA: Diagnosis not present

## 2021-04-20 DIAGNOSIS — E785 Hyperlipidemia, unspecified: Secondary | ICD-10-CM | POA: Diagnosis not present

## 2021-04-20 DIAGNOSIS — Z1211 Encounter for screening for malignant neoplasm of colon: Secondary | ICD-10-CM

## 2021-04-20 MED ORDER — OZEMPIC (0.25 OR 0.5 MG/DOSE) 2 MG/1.5ML ~~LOC~~ SOPN: 0.5000 mg | PEN_INJECTOR | SUBCUTANEOUS | 5 refills | Status: DC

## 2021-04-20 NOTE — Assessment & Plan Note (Signed)
Testosterone levels at goal.

## 2021-04-20 NOTE — Assessment & Plan Note (Signed)
Last LDL 66.  Continue Lipitor 80 mg daily.  Given history of CAD with NSTEMI goal LDL is less than 70

## 2021-04-20 NOTE — Progress Notes (Signed)
Chief Complaint:  Michael [name] is a 55 y.o. male who presents today for his annual comprehensive physical exam.    Assessment/Plan:  Chronic Problems Addressed Today: Obesity, morbid (HCC) BMI 50.  We discussed treatment options.  He is not interested in bariatric surgery at present.  Discussed lifestyle modifications.  We will start Ozempic 0.25 mg weekly for 4 weeks and then increase to 0.5 mg weekly.  He will check in with me in about 6 weeks via MyChart.  We can increase the dose as tolerated.  Low testosterone Testosterone levels at goal.  Primary hypertension At goal on valsartan 160 mg daily and Metroprolol tartrate 25 mg twice daily.  Hyperlipidemia LDL goal <70   Last LDL 66.  Continue Lipitor 80 mg daily.  Given history of CAD with NSTEMI goal LDL is less than 70  Preventative Healthcare: Order Cologuard.  Up-to-date on other screenings and vaccines.  Patient Counseling(The following topics were reviewed and/or handout was given):  -Nutrition: Stressed importance of moderation in sodium/caffeine intake, saturated fat and cholesterol, caloric balance, sufficient intake of fresh fruits, vegetables, and fiber.  -Stressed the importance of regular exercise.   -Substance Abuse: Discussed cessation/primary prevention of tobacco, alcohol, or other drug use; driving or other dangerous activities under the influence; availability of treatment for abuse.   -Injury prevention: Discussed safety belts, safety helmets, smoke detector, smoking near bedding or upholstery.   -Sexuality: Discussed sexually transmitted diseases, partner selection, use of condoms, avoidance of unintended pregnancy and contraceptive alternatives.   -Dental health: Discussed importance of regular tooth brushing, flossing, and dental visits.  -Health maintenance and immunizations reviewed. Please refer to Health maintenance section.  Return to care in 1 year for next preventative visit.     Subjective:   HPI:  He has no acute complaints today.   Lifestyle Diet: None specific.  Exercise: Busy with work.   Depression screen PHQ 2/9 04/20/2021  Decreased Interest 0  Down, Depressed, Hopeless 0  PHQ - 2 Score 0  Altered sleeping -  Tired, decreased energy -  Change in appetite -  Feeling bad or failure about yourself  -  Trouble concentrating -  Moving slowly or fidgety/restless -  Suicidal thoughts -  PHQ-9 Score -  Difficult doing work/chores -    Health Maintenance Due  Topic Date Due  . Hepatitis C Screening  Never done  . COLONOSCOPY (Pts 45-53yrs Insurance coverage will need to be confirmed)  Never done  . Zoster Vaccines- Shingrix (2 of 2) 10/11/2020     ROS: Per HPI, otherwise a complete review of systems was negative.   PMH:  The following were reviewed and entered/updated in epic: Past Medical History:  Diagnosis Date  . (HFpEF) heart failure with preserved ejection fraction (HCC)    a. 05/2020 Echo: EF 60%, no rwma, Gr2 DD. Triv MR.  Marland Kitchen CAD (coronary artery disease)    a. 04/2020 NSTEMI/PCI: LM nl, LAD nl, LCX nl, RCA 56m, 85d (3.6x26 Resolute Onyx DES), RPDA nl, RPAV nl, EF 50-55%.  Marland Kitchen Hyperlipidemia LDL goal <70 08/28/2018  . Obesity hypoventilation syndrome (HCC)   . Primary hypertension 08/28/2018  . Sleep apnea   . Sleep apnea with use of continuous positive airway pressure (CPAP) 08/27/2013  . Viral meningitis    Patient Active Problem List   Diagnosis Date Noted  . Low testosterone 08/16/2020  . CAD (coronary artery disease) s/p NSTEMI 2021 05/21/2020  . (HFpEF) heart failure with preserved ejection fraction (HCC)  05/21/2020  . Dyshidrosis 08/31/2019  . Insomnia 08/31/2019  . Hyperlipidemia LDL goal <70 08/28/2018  . Erectile dysfunction 08/28/2018  . Primary hypertension 08/28/2018  . Obesity, morbid (HCC) 02/24/2014  . OSA treated with BiPAP 08/27/2013  . Nocturia 08/27/2013  . Obesity hypoventilation syndrome [facility])    Past Surgical History:   Procedure Laterality Date  . CARDIAC CATHETERIZATION    . CORONARY STENT INTERVENTION N/A 05/20/2020   Procedure: CORONARY STENT INTERVENTION;  Surgeon: Yvonne Kendall, MD;  Location: [facility] INVASIVE CV [facility];  Service: Cardiovascular;  Laterality: N/A;  . LEFT HEART CATH AND CORONARY ANGIOGRAPHY N/A 05/20/2020   Procedure: LEFT HEART CATH AND CORONARY ANGIOGRAPHY;  Surgeon: Yvonne Kendall, MD;  Location: [facility] INVAS[facility] CV [facility];  Service: Cardiovascular;  Laterality: N/A;    Family History  Problem Relation Age of Onset  . Alzheimer's disease Mother     Medications- reviewed and updated Current Outpatient Medications  Medication Sig Dispense Refill  . acitretin (SORIATANE) 25 MG capsule Take 25 mg by mouth daily before breakfast.    . albuterol (PROAIR HFA) 108 (90 Base) MCG/ACT inhaler Inhale 2 puffs into the lungs every 6 (six) hours as needed for wheezing or shortness of breath. 1 each 0  . aspirin 81 MG EC tablet Take 1 tablet (81 mg total) by mouth daily. Swallow whole. 30 tablet 6  . atorvastatin (LIPITOR) 80 MG tablet Take 1 tablet (80 mg total) by mouth at bedtime. 30 tablet 6  . benzonatate (TESSALON PERLES) 100 MG capsule Take 1 capsule (100 mg total) by mouth 3 (three) times daily as needed. 20 capsule 0  . bumetanide (BUMEX) 0.5 MG tablet TAKE 1 TABLET BY MOUTH EVERY DAY 90 tablet 1  . furosemide (LASIX) 20 MG tablet Take 1 tablet (20 mg total) by mouth daily as needed for fluid (Leg swelling). 30 tablet 6  . metoprolol tartrate (LOPRESSOR) 25 MG tablet Take 1 tablet (25 mg total) by mouth 2 (two) times daily. 60 tablet 6  . Multiple Vitamin (MULTIVITAMIN) capsule Take 1 capsule by mouth daily.    . nitroGLYCERIN (NITROSTAT) 0.4 MG SL tablet Place 1 tablet (0.4 mg total) under the tongue every 5 (five) minutes x 3 doses as needed for chest pain. 25 tablet 3  . prasugrel (EFFIENT) 10 MG TABS tablet Take 1 tablet (10 mg total) by mouth daily. 30 tablet 6  . Semaglutide,0.25 or 0.5MG /DOS,  (OZEMPIC, 0.25 OR 0.5 MG/DOSE,) 2 MG/1.5ML SOPN Inject 0.5 mg into the skin once a week. 1.5 mL 5  . testosterone cypionate (DEPOTESTOSTERONE CYPIONATE) 200 MG/ML injection INJECT 1 ML (200 MG TOTAL) INTO THE MUSCLE EVERY 14 (FOURTEEN) DAYS. 7 mL 1  . valsartan (DIOVAN) 160 MG tablet Take 1 tablet (160 mg total) by mouth daily. 90 tablet 3   No current facility-administered medications for this visit.    Allergies-reviewed and updated No Known Allergies  Social History   Socioeconomic History  . Marital status: Divorced    Spouse name: Not on file  . Number of children: 1  . Years of education: 52  . Highest education level: Not on file  Occupational History    Employer: [employer]  Tobacco Use  . Smoking status: Never Smoker  . Smokeless tobacco: Never Used  Substance and Sexual Activity  . Alcohol use: Yes    Comment: rarely  . Drug use: No  . Sexual activity: Not on file  Other Topics Concern  . Not on file  Social History Narrative  Patient is single and lives alone.   Patient is working full-time.   Patient has a college education.   Patient is right-handed.   Patient drinks 3-4 cups of coffee daily.   Social Determinants of Health   Financial Resource Strain: Not on file  Food Insecurity: Not on file  Transportation Needs: Not on file  Physical Activity: Not on file  Stress: Not on file  Social Connections: Not on file        Objective:  Physical Exam: BP 131/81   Pulse 65   Temp 98.1 F (36.7 C) (Temporal)   Ht 5\' 8"  (1.727 m)   Wt (!) 331 lb 12.8 oz (150.5 kg)   SpO2 96%   BMI 50.45 kg/m   Body mass index is 50.45 kg/m. Wt Readings from Last 3 Encounters:  04/20/21 (!) 331 lb 12.8 oz (150.5 kg)  01/09/21 (!) 334 lb 9.6 oz (151.8 kg)  08/31/20 (!) 331 lb 12.7 oz (150.5 kg)   Gen: NAD, resting comfortably HEENT: TMs normal bilaterally. OP clear. No thyromegaly noted.  CV: RRR with no murmurs appreciated Pulm: NWOB, CTAB with no crackles, wheezes,  or rhonchi GI: Normal bowel sounds present. Soft, Nontender, Nondistended. MSK: no edema, cyanosis, or clubbing noted Skin: warm, dry Neuro: CN2-12 grossly intact. Strength 5/5 in upper and lower extremities. Reflexes symmetric and intact bilaterally.  Psych: Normal affect and thought content     Sarah Baez M. 09/02/20, MD 04/20/2021 11:13 AM

## 2021-04-20 NOTE — Assessment & Plan Note (Signed)
At goal on valsartan 160 mg daily and Metroprolol tartrate 25 mg twice daily.

## 2021-04-20 NOTE — Patient Instructions (Signed)
It was very nice to see you today!  Please start the Ozempic.  Take 0.25 mg weekly for 4 weeks and then increase to 0.5 mg weekly.  Please check in with me via MyChart in about 6 weeks or so.  We may need to have you come back in for an office visit in 3 to 6 months depending on your response to Ozempic.  Please come back in 1 year for your next physical.  Take care, Dr [name]  PLEASE NOTE:  If you had any lab tests please let us know if you have not heard back within a few days. You may see your results on mychart before we have a chance to review them but we will give you a call once they are reviewed by [place]. If we ordered any referrals today, please let us know if you have not heard from their office within the next week.   Please try these tips to maintain a healthy lifestyle:   Eat at least 3 REAL meals and 1-2 snacks per day.  Aim for no more than 5 hours between eating.  If you eat breakfast, please do so within one hour of getting up.    Each meal should contain half fruits/vegetables, one quarter protein, and one quarter carbs (no bigger than a computer mouse)   Cut down on sweet beverages. This includes juice, soda, and sweet tea.     Drink at least 1 glass of water with each meal and aim for at least 8 glasses per day   Exercise at least 150 minutes every week.    Preventive Care 50-60 Years Old, Male Preventive care refers to lifestyle choices and visits with your health care provider that can promote health and wellness. This includes:  A yearly physical exam. This is also called an annual wellness visit.  Regular dental and eye exams.  Immunizations.  Screening for certain conditions.  Healthy lifestyle choices, such as: ? Eating a healthy diet. ? Getting regular exercise. ? Not using drugs or products that contain nicotine and tobacco. ? Limiting alcohol use. What can I expect for my preventive care visit? Physical exam Your health care provider will  check your:  Height and weight. These may be used to calculate your BMI (body mass index). BMI is a measurement that tells if you are at a healthy weight.  Heart rate and blood pressure.  Body temperature.  Skin for abnormal spots. Counseling Your health care provider may ask you questions about your:  Past medical problems.  Family's medical history.  Alcohol, tobacco, and drug use.  Emotional well-being.  Home life and relationship well-being.  Sexual activity.  Diet, exercise, and sleep habits.  Work and work Astronomer.  Access to firearms. What immunizations do I need? Vaccines are usually given at various ages, according to a schedule. Your health care provider will recommend vaccines for you based on your age, medical history, and lifestyle or other factors, such as travel or where you work.   What tests do I need? Blood tests  Lipid and cholesterol levels. These may be checked every 5 years, or more often if you are over 62 years old.  Hepatitis C test.  Hepatitis B test. Screening  Lung cancer screening. You may have this screening every year starting at age 109 if you have a 30-pack-year history of smoking and currently smoke or have quit within the past 15 years.  Prostate cancer screening. Recommendations will vary depending on your family  history and other risks.  Genital exam to check for testicular cancer or hernias.  Colorectal cancer screening. ? All adults should have this screening starting at age 45 and continuing until age 76. ? Your health care provider may recommend screening at age 54 if you are at increased risk. ? You will have tests every 1-10 years, depending on your results and the type of screening test.  Diabetes screening. ? This is done by checking your blood sugar (glucose) after you have not eaten for a while (fasting). ? You may have this done every 1-3 years.  STD (sexually transmitted disease) testing, if you are at  risk. Follow these instructions at home: Eating and drinking  Eat a diet that includes fresh fruits and vegetables, whole grains, lean protein, and low-fat dairy products.  Take vitamin and mineral supplements as recommended by your health care provider.  Do not drink alcohol if your health care provider tells you not to drink.  If you drink alcohol: ? Limit how much you have to 0-2 drinks a day. ? Be aware of how much alcohol is in your drink. In the [place] one drink equals one 12 oz bottle of beer (355 mL), one 5 oz glass of wine (148 mL), or one 1 oz glass of hard liquor (44 mL).   Lifestyle  Take daily care of your teeth and gums. Brush your teeth every morning and night with fluoride toothpaste. Floss one time each day.  Stay active. Exercise for at least 30 minutes 5 or more days each week.  Do not use any products that contain nicotine or tobacco, such as cigarettes, e-cigarettes, and chewing tobacco. If you need help quitting, ask your health care provider.  Do not use drugs.  If you are sexually active, practice safe sex. Use a condom or other form of protection to prevent STIs (sexually transmitted infections).  If told by your health care provider, take low-dose aspirin daily starting at age 35.  Find healthy ways to cope with stress, such as: ? Meditation, yoga, or listening to music. ? Journaling. ? Talking to a trusted person. ? Spending time with friends and family. Safety  Always wear your seat belt while driving or riding in a vehicle.  Do not drive: ? If you have been drinking alcohol. Do not ride with someone who has been drinking. ? When you are tired or distracted. ? While texting.  Wear a helmet and other protective equipment during sports activities.  If you have firearms in your house, make sure you follow all gun safety procedures. What's next?  Go to your health care provider once a year for an annual wellness visit.  Ask your health care  provider how often you should have your eyes and teeth checked.  Stay up to date on all vaccines. This information is not intended to replace advice given to you by your health care provider. Make sure you discuss any questions you have with your health care provider. Document Revised: 08/04/2019 Document Reviewed: 10/30/2018 Elsevier Patient Education  2021 [employer].

## 2021-04-20 NOTE — Assessment & Plan Note (Signed)
BMI 50.  We discussed treatment options.  He is not interested in bariatric surgery at present.  Discussed lifestyle modifications.  We will start Ozempic 0.25 mg weekly for 4 weeks and then increase to 0.5 mg weekly.  He will check in with me in about 6 weeks via MyChart.  We can increase the dose as tolerated.

## 2021-04-29 ENCOUNTER — Other Ambulatory Visit: Payer: Self-pay | Admitting: Cardiology

## 2021-04-29 DIAGNOSIS — I10 Essential (primary) hypertension: Secondary | ICD-10-CM

## 2021-04-30 ENCOUNTER — Other Ambulatory Visit: Payer: Self-pay | Admitting: Cardiology

## 2021-05-23 DIAGNOSIS — Z79899 Other long term (current) drug therapy: Secondary | ICD-10-CM | POA: Diagnosis not present

## 2021-05-23 DIAGNOSIS — L301 Dyshidrosis [pompholyx]: Secondary | ICD-10-CM | POA: Diagnosis not present

## 2021-06-07 DIAGNOSIS — Z1211 Encounter for screening for malignant neoplasm of colon: Secondary | ICD-10-CM | POA: Diagnosis not present

## 2021-06-07 LAB — COLOGUARD
Cologuard: NEGATIVE
Cologuard: NEGATIVE

## 2021-06-13 LAB — COLOGUARD: COLOGUARD: NEGATIVE

## 2021-06-29 ENCOUNTER — Encounter: Payer: Self-pay | Admitting: Family Medicine

## 2021-07-10 ENCOUNTER — Ambulatory Visit: Payer: BC Managed Care – PPO | Admitting: Cardiology

## 2021-07-21 ENCOUNTER — Telehealth: Payer: Self-pay | Admitting: *Deleted

## 2021-07-21 NOTE — Telephone Encounter (Signed)
Request Reference Number: [id number]. OZEMPIC INJ 2/1.5ML is approved through 07/21/2022. Pharmacy notified

## 2021-07-21 NOTE — Telephone Encounter (Signed)
(  [facility]) Rx #: [id number] Ozempic (0.25 or 0.5 MG/DOSE) 2MG /1.5ML pen-injectors Send today Waiting for determination

## 2021-07-31 ENCOUNTER — Other Ambulatory Visit: Payer: Self-pay

## 2021-07-31 ENCOUNTER — Ambulatory Visit: Payer: PRIVATE HEALTH INSURANCE | Admitting: Cardiology

## 2021-07-31 ENCOUNTER — Encounter: Payer: Self-pay | Admitting: Cardiology

## 2021-07-31 VITALS — BP 132/84 | HR 74 | Temp 97.5°F | Resp 17 | Ht 68.0 in | Wt 307.0 lb

## 2021-07-31 DIAGNOSIS — I10 Essential (primary) hypertension: Secondary | ICD-10-CM

## 2021-07-31 DIAGNOSIS — E7800 Pure hypercholesterolemia, unspecified: Secondary | ICD-10-CM

## 2021-07-31 DIAGNOSIS — I25118 Atherosclerotic heart disease of native coronary artery with other forms of angina pectoris: Secondary | ICD-10-CM

## 2021-07-31 NOTE — Progress Notes (Signed)
Primary Physician/Referring:  Ardith Dark, MD  Patient ID: Michael [name], male    DOB: 1966-02-02, 55 y.o.   MRN: [id number]  Chief Complaint  Patient presents with   Follow-up    6 MONTH   Coronary Artery Disease   Hypertension   Hyperlipidemia   HPI:    Michael [name]  is a 55 y.o. Caucasian male with hypertension, hyperlipidemia, morbid obesity and obstructive sleep apnea on CPAP, presented to the emergency room on 05/19/2020 with central chest tightness with radiation to the left shoulder and jaw.  He was positive for non-ST elevation myocardial infarction, underwent cardiac catheterization the following day and successful angioplasty to the right coronary artery.    He has been compliant with his CPAP, blood pressures well controlled.  He has started a strict dietary program after he changed jobs, now his job is not as stressful and states that he is able to pay more attention to his health.  He has lost close to 15 to 20 pounds in weight in just 1 month and accompanied by his wife.   This is 58-month office visit, accompanied by his wife, he has no specific complaints.  Past Medical History:  Diagnosis Date   (HFpEF) heart failure with preserved ejection fraction (HCC)    a. 05/2020 Echo: EF 60%, no rwma, Gr2 DD. Triv MR.   CAD (coronary artery disease)    a. 04/2020 NSTEMI/PCI: LM nl, LAD nl, LCX nl, RCA 62m, 85d (3.6x26 Resolute Onyx DES), RPDA nl, RPAV nl, EF 50-55%.   Hyperlipidemia LDL goal <70 08/28/2018   Obesity hypoventilation syndrome (HCC)    Primary hypertension 08/28/2018   Sleep apnea    Sleep apnea with use of continuous positive airway pressure (CPAP) 08/27/2013   Viral meningitis    Past Surgical History:  Procedure Laterality Date   CARDIAC CATHETERIZATION     CORONARY STENT INTERVENTION N/A 05/20/2020   Procedure: CORONARY STENT INTERVENTION;  Surgeon: Yvonne Kendall, MD;  Location: [facility];  Service: Cardiovascular;  Laterality: N/A;   LEFT  HEART CATH AND CORONARY ANGIOGRAPHY N/A 05/20/2020   Procedure: LEFT HEART CATH AND CORONARY ANGIOGRAPHY;  Surgeon: Yvonne Kendall, MD;  Location: [facility];  Service: Cardiovascular;  Laterality: N/A;   Family History  Problem Relation Age of Onset   Alzheimer's disease Mother     Social History   Tobacco Use   Smoking status: Never   Smokeless tobacco: Never  Substance Use Topics   Alcohol use: Yes    Comment: rarely   Marital Status: Divorced  ROS  Review of Systems  Constitutional: Positive for weight loss (intentional). Negative for malaise/fatigue.  Cardiovascular:  Negative for chest pain, dyspnea on exertion and leg swelling.  Respiratory:  Positive for snoring (on CPAP).   Gastrointestinal:  Negative for melena.  Objective  Blood pressure 132/84, pulse 74, temperature (!) 97.5 F (36.4 C), temperature source Temporal, resp. rate 17, height 5\' 8"  (1.727 m), weight (!) 307 lb (139.3 kg), SpO2 97 %.  Vitals with BMI 07/31/2021 04/20/2021 01/09/2021  Height 5\' 8"  5\' 8"  5\' 8"   Weight 307 lbs 331 lbs 13 oz 334 lbs 10 oz  BMI 46.69 50.46 50.89  Systolic 132 131 01/11/2021  Diastolic 84 81 80  Pulse 74 65 76     Physical Exam Constitutional:      Comments: He is well-built and morbidly obese in no acute distress.  Cardiovascular:     Rate and  Rhythm: Normal rate and regular rhythm.     Pulses: Intact distal pulses.     Heart sounds: Normal heart sounds. No murmur heard.   No gallop.     Comments: No leg edema, no JVD. Pulmonary:     Effort: Pulmonary effort is normal.     Breath sounds: Normal breath sounds.  Abdominal:     General: Bowel sounds are normal.     Palpations: Abdomen is soft.     Comments: Pannus present   Laboratory examination:   Recent Labs    01/25/21 0845  NA 140  K 4.4  CL 104  CO2 27  GLUCOSE 81  BUN 22  CREATININE 0.99  CALCIUM 9.3   CrCl cannot be calculated (Patient's most recent lab result is older than the maximum 21 days  allowed.).  CMP Latest Ref Rng & Units 01/25/2021 05/21/2020 05/19/2020  Glucose 70 - 99 mg/dL 81 559(R) 416(L)  BUN 6 - 23 mg/dL 22 9 14   Creatinine 0.40 - 1.50 mg/dL 8.45 3.64  Sodium 135 - 145 mEq/L 140 138 140  Potassium 3.5 - 5.1 mEq/L 4.4 3.8 3.8  Chloride 96 - 112 mEq/L 104 106 106  CO2 19 - 32 mEq/L 27 22 24   Calcium 8.4 - 10.5 mg/dL 9.3 9.0 9.5  Total Protein 6.0 - 8.3 g/dL 6.9 - 6.6  Total Bilirubin 0.2 - 1.2 mg/dL 0.7 - 0.5  Alkaline Phos 39 - 117 U/L 46 - 68  AST 0 - 37 U/L 20 - 28  ALT 0 - 53 U/L 23 - 36   CBC Latest Ref Rng & Units 01/25/2021 05/21/2020 05/19/2020  WBC 4.0 - 10.5 K/uL 7.4 8.9 8.1  Hemoglobin 13.0 - 17.0 g/dL 07/22/2020 07/20/2020 32.1  Hematocrit 39.0 - 52.0 % 48.8 44.4 47.5  Platelets 150.0 - 400.0 K/uL 150.0 175 188   Lipid Panel Recent Labs    01/25/21 0845  CHOL 118  TRIG 104.0  LDLCALC 66  VLDL 20.8  HDL 31.90*  CHOLHDL 4    HEMOGLOBIN A1C Lab Results  Component Value Date   HGBA1C 5.5 01/25/2021   MPG 105.41 05/19/2020   TSH Recent Labs    01/25/21 0845  TSH 2.34   Medications and allergies  No Known Allergies  Current Outpatient Medications on File Prior to Visit  Medication Sig Dispense Refill   acitretin (SORIATANE) 25 MG capsule Take 25 mg by mouth daily before breakfast.     ASPIRIN LOW DOSE 81 MG EC tablet TAKE 1 TABLET (81 MG TOTAL) BY MOUTH DAILY. SWALLOW WHOLE. 90 tablet 2   atorvastatin (LIPITOR) 80 MG tablet TAKE 1 TABLET BY MOUTH EVERYDAY AT BEDTIME 90 tablet 2   bumetanide (BUMEX) 0.5 MG tablet TAKE 1 TABLET BY MOUTH EVERY DAY 90 tablet 1   metoprolol tartrate (LOPRESSOR) 25 MG tablet TAKE 1 TABLET BY MOUTH TWICE A DAY 180 tablet 2   Multiple Vitamin (MULTIVITAMIN) capsule Take 1 capsule by mouth daily.     nitroGLYCERIN (NITROSTAT) 0.4 MG SL tablet Place 1 tablet (0.4 mg total) under the tongue every 5 (five) minutes x 3 doses as needed for chest pain. 25 tablet 3   Semaglutide,0.25 or 0.5MG /DOS, (OZEMPIC, 0.25 OR 0.5 MG/DOSE,)  2 MG/1.5ML SOPN Inject 0.5 mg into the skin once a week. 1.5 mL 5   testosterone cypionate (DEPOTESTOSTERONE CYPIONATE) 200 MG/ML injection INJECT 1 ML (200 MG TOTAL) INTO THE MUSCLE EVERY 14 (FOURTEEN) DAYS. 7 mL 1   valsartan (DIOVAN)  160 MG tablet TAKE 1 TABLET BY MOUTH EVERY DAY 90 tablet 3   No current facility-administered medications on file prior to visit.    Radiology:   No results found.  Cardiac Studies:   Coronary angiogram 05/20/2020 Severe single-vessel coronary artery disease with sequential 40-50% mid and 80-90% distal RCA stenoses.  The distal RCA lesion appears ulcerated and is likely the culprit lesion for the patient's NSTEMI. No angiographically significant coronary artery disease involving the left coronary artery. Low normal left ventricular systolic function with basal and mid inferior hypokinesis.  Left ventricular filling pressure is moderately elevated (LVEDP ~30 mmHg). Successful PCI to the distal RCA using Resolute Onyx 3.5 x 26 mm drug-eluting stent (postdilated to 4.1 mm) with 0% residual stenosis and TIMI-3 flow.  Echocardiogram 05/21/2020:  1. Left ventricular ejection fraction, by estimation, is 60%. The left  ventricle has normal function. The left ventricle has no regional wall  motion abnormalities. There is moderate left ventricular hypertrophy. Left  ventricular diastolic parameters are consistent with Grade II diastolic dysfunction (pseudonormalization).   2. Right ventricular systolic function is normal. The right ventricular  size is normal. Tricuspid regurgitation signal is inadequate for assessing  PA pressure.   3. The mitral valve is grossly normal. Trivial mitral valve  regurgitation. No evidence of mitral stenosis.   4. The aortic valve is tricuspid. Aortic valve regurgitation is not visualized. No aortic stenosis is present.   5. The inferior vena cava is dilated in size with >50% respiratory  variability, suggesting right atrial pressure of 8  mmHg.  EKG  EKG 07/31/2021: Normal sinus rhythm with rate of 76 bpm, normal axis.  No evidence of ischemia, normal EKG. No significant change from EKG 01/09/2021   Assessment     ICD-10-CM   1. Primary hypertension  I10 EKG 12-Lead    2. Coronary artery disease of native artery of native heart with stable angina pectoris (HCC)  I25.118     3. Hypercholesteremia  E78.00 LDL cholesterol, direct    Lipid Panel With LDL/HDL Ratio    4. Class 3 severe obesity due to excess calories with serious comorbidity and body mass index (BMI) of 50.0 to 59.9 in adult [facility])  E66.01    Z68.43     No orders of the defined types were placed in this encounter.  Medications Discontinued During This Encounter  Medication Reason   albuterol (PROAIR HFA) 108 (90 Base) MCG/ACT inhaler Error   benzonatate (TESSALON PERLES) 100 MG capsule Error   furosemide (LASIX) 20 MG tablet Error   prasugrel (EFFIENT) 10 MG TABS tablet Completed Course   Orders Placed This Encounter  Procedures   LDL cholesterol, direct   Lipid Panel With LDL/HDL Ratio   EKG 12-Lead     Recommendations:   Michael [name]  is a 55 y.o. Caucasian male with hypertension, hyperlipidemia, morbid obesity and obstructive sleep apnea on CPAP, presented to the emergency room on 05/19/2020 with central chest tightness with radiation to the left shoulder and jaw.  He was positive for non-ST elevation myocardial infarction, underwent cardiac catheterization the following day and successful angioplasty to the right coronary artery.    He has been compliant with his CPAP, blood pressures well controlled.  He has started a strict dietary program after he changed jobs, now his job is not as stressful and states that he is able to pay more attention to his health.  He has lost close to 15 to 20 pounds in weight  in just 1 month and accompanied by his wife.  Physical examination is unremarkable except for morbid obesity.  No changes in the EKG and he has  not had any recurrence of angina pectoris and his lipids are well controlled and blood pressure is well controlled.  We will discontinue prasugrel as he has completed 1 year of dual antiplatelet therapy.  Otherwise continue present medications, weight loss discussed extensively, encouraged him to stick to his diet.  Unless problems I will see him back in 1 year or sooner if problems.  Extensive discussion was had with him and his wife regarding dietary modification, importance of maintaining ideal body weight and regular exercise    Michael DecampJay Alea Ryer, MD, [facility] 07/31/2021, 9:27 PM Office: [phone]

## 2021-08-07 ENCOUNTER — Telehealth (INDEPENDENT_AMBULATORY_CARE_PROVIDER_SITE_OTHER): Payer: 59 | Admitting: Family Medicine

## 2021-08-07 ENCOUNTER — Other Ambulatory Visit: Payer: Self-pay

## 2021-08-07 ENCOUNTER — Encounter: Payer: Self-pay | Admitting: Family Medicine

## 2021-08-07 DIAGNOSIS — U071 COVID-19: Secondary | ICD-10-CM

## 2021-08-07 DIAGNOSIS — I5030 Unspecified diastolic (congestive) heart failure: Secondary | ICD-10-CM

## 2021-08-07 DIAGNOSIS — I25118 Atherosclerotic heart disease of native coronary artery with other forms of angina pectoris: Secondary | ICD-10-CM | POA: Diagnosis not present

## 2021-08-07 MED ORDER — NIRMATRELVIR/RITONAVIR (PAXLOVID)TABLET: 3.0000 | ORAL_TABLET | Freq: Two times a day (BID) | ORAL | 0 refills | Status: AC

## 2021-08-07 NOTE — Progress Notes (Signed)
Virtual Visit via Video Note  Subjective  CC:  Chief Complaint  Patient presents with   Covid Positive    Tested positive 08/05/2021. Cough, body aches, had a fever, fatigue     I connected with [name] on 08/07/21 at  9:00 AM EDT by a video enabled telemedicine application and verified that I am speaking with the correct person using two identifiers. Location patient: Home Location provider: [facility] [address] Office Persons participating in the virtual visit: ARSHAN JABS, Willow Ora, MD Jolyne Loa CMA  I discussed the limitations of evaluation and management by telemedicine and the availability of in person appointments. The patient expressed understanding and agreed to proceed. HPI: Michael [name] is a 54 y.o. male who was contacted today to address the problems listed above in the chief complaint. Symptoms start Friday: myalgias, headache, fevers, congestion. Initial covid test was negative x 2. Had flu shot on Thursday so thought sxs were result of that. Saturday afternoon came back positive. Then had repeat covid positive that day. Since,has typical sxs: aches, tired, congestion and headache. No sob. + fevers tamx 101; now with low grade. No chest pain. Has CAD/CHF and pulmonary hypertension. +loose stools. Has had 2 covid vaccinations. Waiting to get his 3rd booster vaccine.   Assessment  1. COVID-19 virus infection   2. Heart failure with preserved ejection fraction, unspecified HF chronicity (HCC)   3. Coronary artery disease of native artery of native heart with stable angina pectoris (HCC)   4. Obesity, morbid (HCC)      Plan  Covid infection in high risk pt with cad/chf/pulm htn:  nontoxic but at risk for worsening disease. I reviewed PL and chart. Reviewed lab results from march and June. Nl renal function. Discussed indications for paxlovid, risks, benefits and expectations of use. Pt agrees to use. Ordered. Supportive care  recommended for sx control. Discussed when to RTW. To f/u if worsens.  I discussed the assessment and treatment plan with the patient. The patient was provided an opportunity to ask questions and all were answered. The patient agreed with the plan and demonstrated an understanding of the instructions.   The patient was advised to call back or seek an in-person evaluation if the symptoms worsen or if the condition fails to improve as anticipated. Follow up: prn    Meds ordered this encounter  Medications   nirmatrelvir/ritonavir EUA (PAXLOVID) 20 x 150 MG & 10 x 100MG  TABS    Sig: Take 3 tablets by mouth 2 (two) times daily for 5 days. (Take nirmatrelvir 150 mg two tablets twice daily for 5 days and ritonavir 100 mg one tablet twice daily for 5 days) Patient GFR is 86    Dispense:  30 tablet    Refill:  0      I reviewed the patients updated PMH, FH, and SocHx.    Patient Active Problem List   Diagnosis Date Noted   Low testosterone 08/16/2020   CAD (coronary artery disease) s/p NSTEMI 2021 05/21/2020   (HFpEF) heart failure with preserved ejection fraction (HCC) 05/21/2020   Dyshidrosis 08/31/2019   Insomnia 08/31/2019   Hyperlipidemia LDL goal <70 08/28/2018   Erectile dysfunction 08/28/2018   Primary hypertension 08/28/2018   Obesity, morbid (HCC) 02/24/2014   OSA treated with BiPAP 08/27/2013   Nocturia 08/27/2013   Obesity hypoventilation syndrome (HCC)    Current Meds  Medication Sig   acitretin (SORIATANE) 25 MG  capsule Take 25 mg by mouth daily before breakfast.   ASPIRIN LOW DOSE 81 MG EC tablet TAKE 1 TABLET (81 MG TOTAL) BY MOUTH DAILY. SWALLOW WHOLE.   atorvastatin (LIPITOR) 80 MG tablet TAKE 1 TABLET BY MOUTH EVERYDAY AT BEDTIME   bumetanide (BUMEX) 0.5 MG tablet TAKE 1 TABLET BY MOUTH EVERY DAY   metoprolol tartrate (LOPRESSOR) 25 MG tablet TAKE 1 TABLET BY MOUTH TWICE A DAY   Multiple Vitamin (MULTIVITAMIN) capsule Take 1 capsule by mouth daily.    nirmatrelvir/ritonavir EUA (PAXLOVID) 20 x 150 MG & 10 x 100MG  TABS Take 3 tablets by mouth 2 (two) times daily for 5 days. (Take nirmatrelvir 150 mg two tablets twice daily for 5 days and ritonavir 100 mg one tablet twice daily for 5 days) Patient GFR is 86   nitroGLYCERIN (NITROSTAT) 0.4 MG SL tablet Place 1 tablet (0.4 mg total) under the tongue every 5 (five) minutes x 3 doses as needed for chest pain.   Semaglutide,0.25 or 0.5MG /DOS, (OZEMPIC, 0.25 OR 0.5 MG/DOSE,) 2 MG/1.5ML SOPN Inject 0.5 mg into the skin once a week.   testosterone cypionate (DEPOTESTOSTERONE CYPIONATE) 200 MG/ML injection INJECT 1 ML (200 MG TOTAL) INTO THE MUSCLE EVERY 14 (FOURTEEN) DAYS.   valsartan (DIOVAN) 160 MG tablet TAKE 1 TABLET BY MOUTH EVERY DAY    Allergies: Patient has No Known Allergies. Family History: Patient family history includes Alzheimer's disease in his mother. Social History:  Patient  reports that he has never smoked. He has never used smokeless tobacco. He reports current alcohol use. He reports that he does not use drugs.  Review of Systems: Constitutional: + for fever malaise or anorexia Cardiovascular: negative for chest pain Respiratory: negative for SOB or persistent cough Gastrointestinal: negative for abdominal pain, no n/v  OBJECTIVE Vitals: There were no vitals taken for this visit. General: no acute distress , A&Ox3, nasal congestion Normal respirations. Nl speech. No sig coughing on exam   , MD

## 2021-08-08 ENCOUNTER — Other Ambulatory Visit: Payer: Self-pay | Admitting: Family Medicine

## 2021-08-29 ENCOUNTER — Encounter: Payer: Self-pay | Admitting: Family Medicine

## 2021-08-29 ENCOUNTER — Telehealth (INDEPENDENT_AMBULATORY_CARE_PROVIDER_SITE_OTHER): Payer: 59 | Admitting: Family Medicine

## 2021-08-29 VITALS — Ht 70.0 in | Wt 310.0 lb

## 2021-08-29 DIAGNOSIS — I25118 Atherosclerotic heart disease of native coronary artery with other forms of angina pectoris: Secondary | ICD-10-CM | POA: Diagnosis not present

## 2021-08-29 DIAGNOSIS — I5030 Unspecified diastolic (congestive) heart failure: Secondary | ICD-10-CM

## 2021-08-29 NOTE — Assessment & Plan Note (Signed)
On aspirin 81 mg daily and Lipitor 80 mg daily.

## 2021-08-29 NOTE — Progress Notes (Signed)
   Michael [name] is a 55 y.o. male who presents today for a virtual office visit.  Assessment/Plan:  New/Acute Problems: Cough Concern for possible flash pulmonary edema given his recent dietary discretions and pink-tinged frothy sputum.  Currently appears well with no signs of volume overload.  This was likely triggered by dietary indiscretions in the several days preceding his episode.  Discussed the importance of salt avoidance.  Recommended he follow back up with his cardiologist.  May need another EKG and echocardiogram.  Discussed reasons to return to care and seek emergent care.  Chronic Problems Addressed Today: (HFpEF) heart failure with preserved ejection fraction [facility]) Follows cardiology.  He is on Bumex, metoprolol, and valsartan.  CAD (coronary artery disease) s/p NSTEMI 2021 On aspirin 81 mg daily and Lipitor 80 mg daily.     Subjective:  HPI:  Patient here with concern for sudden episode of cough and shortness of breath that happened 2 days ago.  Patient was on vacation and admits to several dietary indiscretions during this and preceding this.  He had not been paying attention to his sodium intake.  2 days ago had a sudden onset of coughing and difficulty catching his breath.  This lasted for a couple of hours.  His cough was productive of pink-tinged frothy sputum.  He cannot stop coughing.  He ended up putting on his home CPAP machine which helped control symptoms and symptoms subsided after about 2 hours.  He has been checking his oxygen levels at home and they did drop into the low 90s however never dropped below 91.  He did not have any chest pain associated with this.  He has had little more swelling in his legs.  He is up about 15 pounds over the last couple of weeks.  He has been compliant with his medications without notable side effects.  Home blood pressure readings have been in the 130s over 80s.       Objective/Observations  Physical Exam: Gen: NAD, resting  comfortably Pulm: Normal work of breathing Neuro: Grossly normal, moves all extremities Psych: Normal affect and thought content  Virtual Visit via Video   I connected with Michael [name] on 08/29/21 at  1:00 PM EDT by a video enabled telemedicine application and verified that I am speaking with the correct person using two identifiers. The limitations of evaluation and management by telemedicine and the availability of in person appointments were discussed. The patient expressed understanding and agreed to proceed.   Patient location: Home Provider location: [place] [facility] Persons participating in the virtual visit: Myself and Patient     Katina Degree. [name], MD 08/29/2021 1:23 PM

## 2021-08-29 NOTE — Assessment & Plan Note (Signed)
Follows cardiology.  He is on Bumex, metoprolol, and valsartan.

## 2021-08-31 ENCOUNTER — Other Ambulatory Visit: Payer: Self-pay | Admitting: Cardiology

## 2021-08-31 DIAGNOSIS — I10 Essential (primary) hypertension: Secondary | ICD-10-CM

## 2021-12-12 ENCOUNTER — Other Ambulatory Visit: Payer: Self-pay | Admitting: Cardiology

## 2022-01-01 ENCOUNTER — Other Ambulatory Visit: Payer: Self-pay | Admitting: Cardiology

## 2022-02-23 ENCOUNTER — Other Ambulatory Visit: Payer: Self-pay | Admitting: Cardiology

## 2022-02-23 DIAGNOSIS — I10 Essential (primary) hypertension: Secondary | ICD-10-CM

## 2022-03-03 ENCOUNTER — Other Ambulatory Visit: Payer: Self-pay | Admitting: Cardiology

## 2022-04-07 ENCOUNTER — Telehealth: Payer: 59 | Admitting: Nurse Practitioner

## 2022-04-07 DIAGNOSIS — J40 Bronchitis, not specified as acute or chronic: Secondary | ICD-10-CM

## 2022-04-07 MED ORDER — PREDNISONE 10 MG PO TABS: ORAL_TABLET | ORAL | 0 refills | Status: DC

## 2022-04-07 MED ORDER — PROMETHAZINE-DM 6.25-15 MG/5ML PO SYRP: 5.0000 mL | ORAL_SOLUTION | Freq: Four times a day (QID) | ORAL | 0 refills | Status: DC | PRN

## 2022-04-07 MED ORDER — AZITHROMYCIN 250 MG PO TABS: ORAL_TABLET | ORAL | 0 refills | Status: AC

## 2022-04-07 NOTE — Patient Instructions (Addendum)
Michael [name], thank you for joining Claiborne Rigg, NP for today's virtual visit.  While this provider is not your primary care provider (PCP), if your PCP is located in our provider database this encounter information will be shared with them immediately following your visit.  Consent: (Patient) Michael [name] provided verbal consent for this virtual visit at the beginning of the encounter.  Current Medications:  Current Outpatient Medications:    azithromycin (ZITHROMAX) 250 MG tablet, Take 2 tablets on day 1, then 1 tablet daily on days 2 through 5, Disp: 6 tablet, Rfl: 0   predniSONE (DELTASONE) 10 MG tablet, Directions for 6 day taper: Day 1: 2 tablets before breakfast, 1 after both lunch & dinner and 2 at bedtime Day 2: 1 tab before breakfast, 1 after both lunch & dinner and 2 at bedtime Day 3: 1 tab at each meal & 1 at bedtime Day 4: 1 tab at breakfast, 1 at lunch, 1 at bedtime Day 5: 1 tab at breakfast & 1 tab at bedtime Day 6: 1 tab at breakfast, Disp: 21 tablet, Rfl: 0   promethazine-dextromethorphan (PROMETHAZINE-DM) 6.25-15 MG/5ML syrup, Take 5 mLs by mouth 4 (four) times daily as needed for cough., Disp: 240 mL, Rfl: 0   acitretin (SORIATANE) 25 MG capsule, Take 25 mg by mouth daily before breakfast., Disp: , Rfl:    ASPIRIN LOW DOSE 81 MG EC tablet, TAKE 1 TABLET BY MOUTH DAILY, Disp: 90 tablet, Rfl: 2   atorvastatin (LIPITOR) 80 MG tablet, TAKE 1 TABLET BY MOUTH EVERYDAY AT BEDTIME, Disp: 90 tablet, Rfl: 2   bumetanide (BUMEX) 0.5 MG tablet, TAKE 1 TABLET BY MOUTH EVERY DAY, Disp: 30 tablet, Rfl: 5   metoprolol tartrate (LOPRESSOR) 25 MG tablet, TAKE 1 TABLET BY MOUTH TWICE A DAY, Disp: 180 tablet, Rfl: 2   Multiple Vitamin (MULTIVITAMIN) capsule, Take 1 capsule by mouth daily., Disp: , Rfl:    nitroGLYCERIN (NITROSTAT) 0.4 MG SL tablet, Place 1 tablet (0.4 mg total) under the tongue every 5 (five) minutes x 3 doses as needed for chest pain., Disp: 25 tablet, Rfl: 3    Semaglutide,0.25 or 0.5MG /DOS, (OZEMPIC, 0.25 OR 0.5 MG/DOSE,) 2 MG/1.5ML SOPN, Inject 0.5 mg into the skin once a week., Disp: 1.5 mL, Rfl: 5   testosterone cypionate (DEPOTESTOSTERONE CYPIONATE) 200 MG/ML injection, INJECT 1 ML (200 MG TOTAL) INTO THE MUSCLE EVERY 14 (FOURTEEN) DAYS., Disp: 7 mL, Rfl: 1   valsartan (DIOVAN) 160 MG tablet, TAKE 1 TABLET BY MOUTH EVERY DAY, Disp: 90 tablet, Rfl: 3   Medications ordered in this encounter:  Meds ordered this encounter  Medications   azithromycin (ZITHROMAX) 250 MG tablet    Sig: Take 2 tablets on day 1, then 1 tablet daily on days 2 through 5    Dispense:  6 tablet    Refill:  0    Order Specific Question:   Supervising Provider    Answer:   Hyacinth Meeker, BRIAN [[id number]]   predniSONE (DELTASONE) 10 MG tablet    Sig: Directions for 6 day taper: Day 1: 2 tablets before breakfast, 1 after both lunch & dinner and 2 at bedtime Day 2: 1 tab before breakfast, 1 after both lunch & dinner and 2 at bedtime Day 3: 1 tab at each meal & 1 at bedtime Day 4: 1 tab at breakfast, 1 at lunch, 1 at bedtime Day 5: 1 tab at breakfast & 1 tab at bedtime Day 6: 1 tab at breakfast  Dispense:  21 tablet    Refill:  0    Order Specific Question:   Supervising Provider    Answer:   Eber Hong [[id number]]   promethazine-dextromethorphan (PROMETHAZINE-DM) 6.25-15 MG/5ML syrup    Sig: Take 5 mLs by mouth 4 (four) times daily as needed for cough.    Dispense:  240 mL    Refill:  0    Order Specific Question:   Supervising Provider    Answer:   Hyacinth Meeker, BRIAN [[id number]]     *If you need refills on other medications prior to your next appointment, please contact your pharmacy*  Follow-Up: Call back or seek an in-person evaluation if the symptoms worsen or if the condition fails to improve as anticipated.  Other Instructions INSTRUCTIONS: use a humidifier for nasal congestion Drink plenty of fluids, rest and wash hands frequently to avoid the spread of infection Alternate tylenol  and Motrin for relief of fever    If you have been instructed to have an in-person evaluation today at a local Urgent Care facility, please use the link below. It will take you to a list of all of our available [facility], including address, phone number and hours of operation. Please do not delay care.  [facility]  If you or a family member do not have a primary care provider, use the link below to schedule a visit and establish care. When you choose a [facility] primary care physician or advanced practice provider, you gain a long-term partner in health. Find a Primary Care Provider  Learn more about [facility] in-office and virtual care options:  - Get Care Now

## 2022-04-07 NOTE — Progress Notes (Signed)
Virtual Visit Consent   Michael [name], you are scheduled for a virtual visit with a [facility] provider today. Just as with appointments in the office, your consent must be obtained to participate. Your consent will be active for this visit and any virtual visit you may have with one of our providers in the next 365 days. If you have a MyChart account, a copy of this consent can be sent to you electronically.  As this is a virtual visit, video technology does not allow for your provider to perform a traditional examination. This may limit your provider's ability to fully assess your condition. If your provider identifies any concerns that need to be evaluated in person or the need to arrange testing (such as labs, EKG, etc.), we will make arrangements to do so. Although advances in technology are sophisticated, we cannot ensure that it will always work on either your end or our end. If the connection with a video visit is poor, the visit may have to be switched to a telephone visit. With either a video or telephone visit, we are not always able to ensure that we have a secure connection.  By engaging in this virtual visit, you consent to the provision of healthcare and authorize for your insurance to be billed (if applicable) for the services provided during this visit. Depending on your insurance coverage, you may receive a charge related to this service.  I need to obtain your verbal consent now. Are you willing to proceed with your visit today? Michael [name] has provided verbal consent on 04/07/2022 for a virtual visit (video or telephone). Gildardo Pounds, NP  Date: 04/07/2022 10:51 AM  Virtual Visit via Video Note   I, Gildardo Pounds, connected with  Michael [name]  ([id number], May 15, 1966) on 04/07/22 at 10:30 AM EDT by a video-enabled telemedicine application and verified that I am speaking with the correct person using two identifiers.  Location: Patient: Virtual Visit Location Patient:  Home Provider: Virtual Visit Location Provider: Home Office   I discussed the limitations of evaluation and management by telemedicine and the availability of in person appointments. The patient expressed understanding and agreed to proceed.    History of Present Illness: Michael [name] is a 56 y.o. who identifies as a male who was assigned male at birth, and is being seen today for Bronchitis.  Patient notes 5-day onset of weakness, fatigue, productive cough of thick tenacious sputum, shortness of breath, fever which has resolved currently, hoarseness and chest pain with coughing.  Currently taking Robitussin over-the-counter which is ineffective and also causing undesirable side effects.  He has taken several COVID test all which have been negative.  He is not a smoker and denies any history of serious allergies.   Problems:  Patient Active Problem List   Diagnosis Date Noted   Low testosterone 08/16/2020   CAD (coronary artery disease) s/p NSTEMI 2021 05/21/2020   (HFpEF) heart failure with preserved ejection fraction ([place]) 05/21/2020   Dyshidrosis 08/31/2019   Insomnia 08/31/2019   Hyperlipidemia LDL goal <70 08/28/2018   Erectile dysfunction 08/28/2018   Primary hypertension 08/28/2018   Obesity, morbid ([place]) 02/24/2014   OSA treated with BiPAP 08/27/2013   Nocturia 08/27/2013   Obesity hypoventilation syndrome (HCC)     Allergies: No Known Allergies Medications:  Current Outpatient Medications:    azithromycin (ZITHROMAX) 250 MG tablet, Take 2 tablets on day 1, then 1 tablet daily on days 2 through 5, Disp: 6  tablet, Rfl: 0   predniSONE (DELTASONE) 10 MG tablet, Directions for 6 day taper: Day 1: 2 tablets before breakfast, 1 after both lunch & dinner and 2 at bedtime Day 2: 1 tab before breakfast, 1 after both lunch & dinner and 2 at bedtime Day 3: 1 tab at each meal & 1 at bedtime Day 4: 1 tab at breakfast, 1 at lunch, 1 at bedtime Day 5: 1 tab at breakfast & 1 tab at bedtime  Day 6: 1 tab at breakfast, Disp: 21 tablet, Rfl: 0   promethazine-dextromethorphan (PROMETHAZINE-DM) 6.25-15 MG/5ML syrup, Take 5 mLs by mouth 4 (four) times daily as needed for cough., Disp: 240 mL, Rfl: 0   acitretin (SORIATANE) 25 MG capsule, Take 25 mg by mouth daily before breakfast., Disp: , Rfl:    ASPIRIN LOW DOSE 81 MG EC tablet, TAKE 1 TABLET BY MOUTH DAILY, Disp: 90 tablet, Rfl: 2   atorvastatin (LIPITOR) 80 MG tablet, TAKE 1 TABLET BY MOUTH EVERYDAY AT BEDTIME, Disp: 90 tablet, Rfl: 2   bumetanide (BUMEX) 0.5 MG tablet, TAKE 1 TABLET BY MOUTH EVERY DAY, Disp: 30 tablet, Rfl: 5   metoprolol tartrate (LOPRESSOR) 25 MG tablet, TAKE 1 TABLET BY MOUTH TWICE A DAY, Disp: 180 tablet, Rfl: 2   Multiple Vitamin (MULTIVITAMIN) capsule, Take 1 capsule by mouth daily., Disp: , Rfl:    nitroGLYCERIN (NITROSTAT) 0.4 MG SL tablet, Place 1 tablet (0.4 mg total) under the tongue every 5 (five) minutes x 3 doses as needed for chest pain., Disp: 25 tablet, Rfl: 3   Semaglutide,0.25 or 0.5MG /DOS, (OZEMPIC, 0.25 OR 0.5 MG/DOSE,) 2 MG/1.5ML SOPN, Inject 0.5 mg into the skin once a week., Disp: 1.5 mL, Rfl: 5   testosterone cypionate (DEPOTESTOSTERONE CYPIONATE) 200 MG/ML injection, INJECT 1 ML (200 MG TOTAL) INTO THE MUSCLE EVERY 14 (FOURTEEN) DAYS., Disp: 7 mL, Rfl: 1   valsartan (DIOVAN) 160 MG tablet, TAKE 1 TABLET BY MOUTH EVERY DAY, Disp: 90 tablet, Rfl: 3  Observations/Objective: Patient is well-developed, well-nourished in no acute distress.  Resting comfortably at home.  Head is normocephalic, atraumatic.  No labored breathing.  Speech is clear and coherent with logical content.  Patient is alert and oriented at baseline.    Assessment and Plan: 1. Bronchitis - azithromycin (ZITHROMAX) 250 MG tablet; Take 2 tablets on day 1, then 1 tablet daily on days 2 through 5  Dispense: 6 tablet; Refill: 0 - predniSONE (DELTASONE) 10 MG tablet; Directions for 6 day taper: Day 1: 2 tablets before  breakfast, 1 after both lunch & dinner and 2 at bedtime Day 2: 1 tab before breakfast, 1 after both lunch & dinner and 2 at bedtime Day 3: 1 tab at each meal & 1 at bedtime Day 4: 1 tab at breakfast, 1 at lunch, 1 at bedtime Day 5: 1 tab at breakfast & 1 tab at bedtime Day 6: 1 tab at breakfast  Dispense: 21 tablet; Refill: 0 - promethazine-dextromethorphan (PROMETHAZINE-DM) 6.25-15 MG/5ML syrup; Take 5 mLs by mouth 4 (four) times daily as needed for cough.  Dispense: 240 mL; Refill: 0  INSTRUCTIONS: use a humidifier for nasal congestion Drink plenty of fluids, rest and wash hands frequently to avoid the spread of infection Alternate tylenol and Motrin for relief of fever   Follow Up Instructions: I discussed the assessment and treatment plan with the patient. The patient was provided an opportunity to ask questions and all were answered. The patient agreed with the plan and demonstrated an understanding  of the instructions.  A copy of instructions were sent to the patient via MyChart unless otherwise noted below.     The patient was advised to call back or seek an in-person evaluation if the symptoms worsen or if the condition fails to improve as anticipated.  Time:  I spent 11 minutes with the patient via telehealth technology discussing the above problems/concerns.    Gildardo Pounds, NP

## 2022-05-03 ENCOUNTER — Other Ambulatory Visit: Payer: Self-pay | Admitting: Cardiology

## 2022-05-04 ENCOUNTER — Other Ambulatory Visit: Payer: Self-pay | Admitting: Cardiology

## 2022-05-04 DIAGNOSIS — I10 Essential (primary) hypertension: Secondary | ICD-10-CM

## 2022-06-22 ENCOUNTER — Telehealth: Payer: Self-pay | Admitting: *Deleted

## 2022-06-22 NOTE — Telephone Encounter (Signed)
(  Key: [id number]) Ozempic (0.25 or 0.5 MG/DOSE) 2MG /1.5ML pen-injectors Waiting for determination

## 2022-06-23 NOTE — Telephone Encounter (Signed)
PA Request Reference Number: [id number]. OZEMPIC INJ 2/1.5ML is approved through 06/23/2023. Your patient may now fill this prescription and it will be covered.

## 2022-07-31 ENCOUNTER — Encounter: Payer: Self-pay | Admitting: Cardiology

## 2022-07-31 ENCOUNTER — Ambulatory Visit: Payer: 59 | Admitting: Cardiology

## 2022-07-31 VITALS — BP 120/72 | HR 68 | Temp 97.7°F | Resp 16 | Ht 70.0 in | Wt 341.0 lb

## 2022-07-31 DIAGNOSIS — I10 Essential (primary) hypertension: Secondary | ICD-10-CM

## 2022-07-31 DIAGNOSIS — F5101 Primary insomnia: Secondary | ICD-10-CM

## 2022-07-31 DIAGNOSIS — I25118 Atherosclerotic heart disease of native coronary artery with other forms of angina pectoris: Secondary | ICD-10-CM

## 2022-07-31 DIAGNOSIS — E785 Hyperlipidemia, unspecified: Secondary | ICD-10-CM

## 2022-07-31 DIAGNOSIS — N521 Erectile dysfunction due to diseases classified elsewhere: Secondary | ICD-10-CM

## 2022-07-31 DIAGNOSIS — G4733 Obstructive sleep apnea (adult) (pediatric): Secondary | ICD-10-CM

## 2022-07-31 MED ORDER — TRAZODONE HCL 100 MG PO TABS: 100.0000 mg | ORAL_TABLET | Freq: Every day | ORAL | 2 refills | Status: DC

## 2022-07-31 MED ORDER — SILDENAFIL CITRATE 100 MG PO TABS: 100.0000 mg | ORAL_TABLET | Freq: Every day | ORAL | 1 refills | Status: DC | PRN

## 2022-07-31 NOTE — Progress Notes (Signed)
Primary Physician/Referring:  Ardith Dark, MD  Patient ID: Michael [name], male    DOB: 02-01-1966, 56 y.o.   MRN: [id number]  Chief Complaint  Patient presents with   Coronary Artery Disease   Hyperlipidemia   Follow-up    1 year   HPI:    Michael [name]  is a 56 y.o. Caucasian male with hypertension, hyperlipidemia, morbid obesity and obstructive sleep apnea on CPAP and compliant, CAD presenting with NSTEMI S/P  angioplasty and stenting to the right coronary artery.    He has started a strict dietary program after he changed jobs, now his job is not as stressful and states that he is able to pay more attention to his health. He has not had any recurrence of angina pectoris and his lipids are well controlled. The patient desires Viagra to treat his erectile dysfunction.   Past Medical History:  Diagnosis Date   (HFpEF) heart failure with preserved ejection fraction (HCC)    a. 05/2020 Echo: EF 60%, no rwma, Gr2 DD. Triv MR.   CAD (coronary artery disease)    a. 04/2020 NSTEMI/PCI: LM nl, LAD nl, LCX nl, RCA 23m, 85d (3.6x26 Resolute Onyx DES), RPDA nl, RPAV nl, EF 50-55%.   Hyperlipidemia LDL goal <70 08/28/2018   Obesity hypoventilation syndrome (HCC)    Primary hypertension 08/28/2018   Sleep apnea    Sleep apnea with use of continuous positive airway pressure (CPAP) 08/27/2013   Viral meningitis    Past Surgical History:  Procedure Laterality Date   CARDIAC CATHETERIZATION     CORONARY STENT INTERVENTION N/A 05/20/2020   Procedure: CORONARY STENT INTERVENTION;  Surgeon: Yvonne Kendall, MD;  Location: [facility];  Service: Cardiovascular;  Laterality: N/A;   LEFT HEART CATH AND CORONARY ANGIOGRAPHY N/A 05/20/2020   Procedure: LEFT HEART CATH AND CORONARY ANGIOGRAPHY;  Surgeon: Yvonne Kendall, MD;  Location: [facility]VAS[facility] CV [facility];  Service: Cardiovascular;  Laterality: N/A;   Family History  Problem Relation Age of Onset   Alzheimer's disease Mother     Social  History   Tobacco Use   Smoking status: Never   Smokeless tobacco: Never  Substance Use Topics   Alcohol use: Yes    Comment: rarely   Marital Status: Divorced  ROS  Review of Systems  Constitutional: Negative for malaise/fatigue.  Cardiovascular:  Negative for chest pain, dyspnea on exertion and leg swelling.  Respiratory:  Positive for snoring (on CPAP).   Gastrointestinal:  Negative for melena.   Objective  Blood pressure 120/72, pulse 68, temperature 97.7 F (36.5 C), temperature source Temporal, resp. rate 16, height 5\' 10"  (1.778 m), weight (!) 341 lb (154.7 kg), SpO2 96 %.     07/31/2022    4:09 PM 07/31/2022    2:35 PM 08/29/2021    1:01 PM  Vitals with BMI  Height  5\' 10"  5\' 10"   Weight  341 lbs 310 lbs  BMI  48.93 44.48  Systolic 120 145   Diastolic 72 90   Pulse  68      Physical Exam Constitutional:      Comments: He is well-built and morbidly obese in no acute distress.  Cardiovascular:     Rate and Rhythm: Normal rate and regular rhythm.     Pulses: Intact distal pulses.     Heart sounds: Normal heart sounds. No murmur heard.    No gallop.     Comments: No leg edema, no JVD. Pulmonary:  Effort: Pulmonary effort is normal.     Breath sounds: Normal breath sounds.  Abdominal:     General: Bowel sounds are normal.     Palpations: Abdomen is soft.     Comments: Pannus present    Laboratory examination:   No results for input(s): "NA", "K", "CL", "CO2", "GLUCOSE", "BUN", "CREATININE", "CALCIUM", "GFRNONAA", "GFRAA" in the last 8760 hours.  CrCl cannot be calculated (Patient's most recent lab result is older than the maximum 21 days allowed.).     Latest Ref Rng & Units 01/25/2021    8:45 AM 05/21/2020    7:01 AM 05/19/2020    4:39 PM  CMP  Glucose 70 - 99 mg/dL 81  103  109   BUN 6 - 23 mg/dL 22  9  14    Creatinine 0.40 - 1.50 mg/dL 0.99  0.94  0.93   Sodium 135 - 145 mEq/L 140  138  140   Potassium 3.5 - 5.1 mEq/L 4.4  3.8  3.8   Chloride 96 -  112 mEq/L 104  106  106   CO2 19 - 32 mEq/L 27  22  24    Calcium 8.4 - 10.5 mg/dL 9.3  9.0  9.5   Total Protein 6.0 - 8.3 g/dL 6.9     Total Bilirubin 0.2 - 1.2 mg/dL 0.7     Alkaline Phos 39 - 117 U/L 46     AST 0 - 37 U/L 20     ALT 0 - 53 U/L 23         Latest Ref Rng & Units 01/25/2021    8:45 AM 05/21/2020    7:01 AM 05/19/2020    4:39 PM  CBC  WBC 4.0 - 10.5 K/uL 7.4  8.9  8.1   Hemoglobin 13.0 - 17.0 g/dL 16.0  14.5  15.6   Hematocrit 39.0 - 52.0 % 48.8  44.4  47.5   Platelets 150.0 - 400.0 K/uL 150.0  175  188    Lipid Panel No results for input(s): "CHOL", "TRIG", "LDLCALC", "VLDL", "HDL", "CHOLHDL", "LDLDIRECT" in the last 8760 hours.   HEMOGLOBIN A1C Lab Results  Component Value Date   HGBA1C 5.5 01/25/2021   MPG 105.41 05/19/2020   TSH No results for input(s): "TSH" in the last 8760 hours.  Medications and allergies  No Known Allergies    Outpatient Medications Prior to Visit  Medication Sig Dispense Refill   acitretin (SORIATANE) 25 MG capsule Take 25 mg by mouth daily before breakfast.     ASPIRIN LOW DOSE 81 MG EC tablet TAKE 1 TABLET BY MOUTH DAILY 90 tablet 2   atorvastatin (LIPITOR) 80 MG tablet TAKE 1 TABLET BY MOUTH EVERYDAY AT BEDTIME 90 tablet 2   bumetanide (BUMEX) 0.5 MG tablet TAKE 1 TABLET BY MOUTH EVERY DAY 30 tablet 5   MAGNESIUM PO Take by mouth.     metoprolol tartrate (LOPRESSOR) 25 MG tablet TAKE 1 TABLET BY MOUTH TWICE A DAY 180 tablet 2   Multiple Vitamin (MULTIVITAMIN) capsule Take 1 capsule by mouth daily.     nitroGLYCERIN (NITROSTAT) 0.4 MG SL tablet Place 1 tablet (0.4 mg total) under the tongue every 5 (five) minutes x 3 doses as needed for chest pain. 25 tablet 3   valsartan (DIOVAN) 160 MG tablet TAKE 1 TABLET BY MOUTH EVERY DAY 90 tablet 3   Semaglutide,0.25 or 0.5MG /DOS, (OZEMPIC, 0.25 OR 0.5 MG/DOSE,) 2 MG/1.5ML SOPN Inject 0.5 mg into the skin once a week. (Patient not  taking: Reported on 07/31/2022) 1.5 mL 5   testosterone  cypionate (DEPOTESTOSTERONE CYPIONATE) 200 MG/ML injection INJECT 1 ML (200 MG TOTAL) INTO THE MUSCLE EVERY 14 (FOURTEEN) DAYS. (Patient not taking: Reported on 07/31/2022) 7 mL 1   predniSONE (DELTASONE) 10 MG tablet Directions for 6 day taper: Day 1: 2 tablets before breakfast, 1 after both lunch & dinner and 2 at bedtime Day 2: 1 tab before breakfast, 1 after both lunch & dinner and 2 at bedtime Day 3: 1 tab at each meal & 1 at bedtime Day 4: 1 tab at breakfast, 1 at lunch, 1 at bedtime Day 5: 1 tab at breakfast & 1 tab at bedtime Day 6: 1 tab at breakfast (Patient not taking: Reported on 07/31/2022) 21 tablet 0   promethazine-dextromethorphan (PROMETHAZINE-DM) 6.25-15 MG/5ML syrup Take 5 mLs by mouth 4 (four) times daily as needed for cough. 240 mL 0   No facility-administered medications prior to visit.   Meds ordered this encounter  Medications   traZODone (DESYREL) 100 MG tablet    Sig: Take 1 tablet (100 mg total) by mouth at bedtime.    Dispense:  30 tablet    Refill:  2   sildenafil (VIAGRA) 100 MG tablet    Sig: Take 1 tablet (100 mg total) by mouth daily as needed for erectile dysfunction (Erectile dysfunction).    Dispense:  30 tablet    Refill:  1   Medications Discontinued During This Encounter  Medication Reason   predniSONE (DELTASONE) 10 MG tablet    promethazine-dextromethorphan (PROMETHAZINE-DM) 6.25-15 MG/5ML syrup      Radiology:   No results found.  Cardiac Studies:   Coronary angiogram 05/20/2020 Severe single-vessel coronary artery disease with sequential 40-50% mid and 80-90% distal RCA stenoses.  The distal RCA lesion appears ulcerated and is likely the culprit lesion for the patient's NSTEMI. No angiographically significant coronary artery disease involving the left coronary artery. Low normal left ventricular systolic function with basal and mid inferior hypokinesis.  Left ventricular filling pressure is moderately elevated (LVEDP ~30 mmHg). Successful PCI  to the distal RCA using Resolute Onyx 3.5 x 26 mm drug-eluting stent (postdilated to 4.1 mm) with 0% residual stenosis and TIMI-3 flow.  Echocardiogram 05/21/2020:  1. Left ventricular ejection fraction, by estimation, is 60%. The left  ventricle has normal function. The left ventricle has no regional wall  motion abnormalities. There is moderate left ventricular hypertrophy. Left  ventricular diastolic parameters are consistent with Grade II diastolic dysfunction (pseudonormalization).   2. Right ventricular systolic function is normal. The right ventricular  size is normal. Tricuspid regurgitation signal is inadequate for assessing  PA pressure.   3. The mitral valve is grossly normal. Trivial mitral valve  regurgitation. No evidence of mitral stenosis.   4. The aortic valve is tricuspid. Aortic valve regurgitation is not visualized. No aortic stenosis is present.   5. The inferior vena cava is dilated in size with >50% respiratory  variability, suggesting right atrial pressure of 8 mmHg.  EKG  EKG 07/31/2021: Normal sinus rhythm with rate of 76 bpm, normal axis.  No evidence of ischemia, normal EKG. No significant change from EKG 01/09/2021   Assessment     ICD-10-CM   1. Primary hypertension  I10     2. Hyperlipidemia LDL goal <70  E78.5     3. Coronary artery disease of native artery of native heart with stable angina pectoris (HCC)  I25.118     4. Primary insomnia  F51.01     5. OSA on CPAP  G47.33    Z99.89     No orders of the defined types were placed in this encounter.  Medications Discontinued During This Encounter  Medication Reason   predniSONE (DELTASONE) 10 MG tablet    promethazine-dextromethorphan (PROMETHAZINE-DM) 6.25-15 MG/5ML syrup    No orders of the defined types were placed in this encounter.    Recommendations:   Michael [name]  is a 56 y.o. Caucasian male with hypertension, hyperlipidemia, morbid obesity and obstructive sleep apnea on CPAP and  compliant, CAD presenting with NSTEMI S/P  angioplasty and stenting to the right coronary artery.    He has started a strict dietary program after he changed jobs, now his job is not as stressful and states that he is able to pay more attention to his health.   Physical examination is unremarkable except for morbid obesity. He has not had any recurrence of angina pectoris and his lipids are well controlled. Otherwise continue present medications, weight loss discussed extensively, encouraged him to stick to his diet.    Will order sildenafil for ED. The patient desires Viagra to treat his erectile dysfunction.  He is aware of the interaction with nitroglycerin.  He will follow-up with sleep team to have CPAP checked and updated if needed.  He is having difficulty sleeping, will also start Trazodone for sleep aide.   His blood pressure was elevated in office, however brings home recordings which are under excellent care.  Hence advised him to bring his blood pressure apparatus on his next office visit in 6 weeks as he wishes to make sure that blood pressure is well controlled and also for compliance with weight loss. He was previously prescribed Ozempic and would like to restart this medication. He did not have any side effects from medication but felt that it did not work well for him. He was on 0.25mg  for approximately 1 month.  I discussed with him that the best action of Ozempic would be to eat only one half of his meal and to wait for 20 to 30 minutes to experience satiety which would lead to eventual weight loss and also reduces nausea and vomiting feeling.  Avoidance of fried, oily food.  Also discussed.   Adrian Prows, MD, [facility] 08/05/2022, 11:56 AM Office: [phone] Fax: [phone] Pager: [phone]

## 2022-08-13 ENCOUNTER — Encounter: Payer: Self-pay | Admitting: *Deleted

## 2022-08-20 ENCOUNTER — Other Ambulatory Visit: Payer: Self-pay | Admitting: Cardiology

## 2022-08-20 DIAGNOSIS — I10 Essential (primary) hypertension: Secondary | ICD-10-CM

## 2022-08-23 ENCOUNTER — Other Ambulatory Visit: Payer: Self-pay | Admitting: Cardiology

## 2022-08-23 DIAGNOSIS — G4733 Obstructive sleep apnea (adult) (pediatric): Secondary | ICD-10-CM

## 2022-08-23 DIAGNOSIS — F5101 Primary insomnia: Secondary | ICD-10-CM

## 2022-08-27 NOTE — Telephone Encounter (Signed)
Refill request

## 2022-09-11 ENCOUNTER — Encounter: Payer: Self-pay | Admitting: Cardiology

## 2022-09-11 ENCOUNTER — Ambulatory Visit: Payer: 59 | Admitting: Cardiology

## 2022-09-11 VITALS — BP 150/94 | HR 68

## 2022-09-11 DIAGNOSIS — G4733 Obstructive sleep apnea (adult) (pediatric): Secondary | ICD-10-CM

## 2022-09-11 DIAGNOSIS — I10 Essential (primary) hypertension: Secondary | ICD-10-CM

## 2022-09-11 DIAGNOSIS — F5101 Primary insomnia: Secondary | ICD-10-CM

## 2022-09-11 DIAGNOSIS — M10072 Idiopathic gout, left ankle and foot: Secondary | ICD-10-CM

## 2022-09-11 MED ORDER — COLCHICINE 0.6 MG PO TABS: 0.6000 mg | ORAL_TABLET | Freq: Two times a day (BID) | ORAL | 0 refills | Status: DC

## 2022-09-11 MED ORDER — SEMAGLUTIDE (1 MG/DOSE) 4 MG/3ML ~~LOC~~ SOPN: 1.0000 mg | PEN_INJECTOR | SUBCUTANEOUS | 3 refills | Status: DC

## 2022-09-11 NOTE — Progress Notes (Signed)
Primary Physician/Referring:  Vivi Barrack, MD  Patient ID: Michael [name], male    DOB: 02/09/1966, 56 y.o.   MRN: [id number]  Chief Complaint  Patient presents with   Hypertension   Blood Pressure Check   Gout   HPI:    TREYON LANCTOT  is a 56 y.o. Caucasian male with hypertension, hyperlipidemia, morbid obesity and obstructive sleep apnea on CPAP and compliant, CAD presenting with NSTEMI S/P  angioplasty and stenting to the right coronary artery.    He presents here for a 4-week follow-up of hypertension, also had severe pain and swelling in his left greater toe that started about 3 days ago, he spoke with "[facility]" and was prescribed NSAID.  Brings in blood pressure recordings from home and also his blood pressure apparatus.  No chest pain, no dyspnea.  Has lost 10 pounds since being on Ozempic.  Past Medical History:  Diagnosis Date   (HFpEF) heart failure with preserved ejection fraction ([place])    a. 05/2020 Echo: EF 60%, no rwma, Gr2 DD. Triv MR.   CAD (coronary artery disease)    a. 04/2020 NSTEMI/PCI: LM nl, LAD nl, LCX nl, RCA 83m, 85d (3.6x26 Resolute Onyx DES), RPDA nl, RPAV nl, EF 50-55%.   Hyperlipidemia LDL goal <70 08/28/2018   Obesity hypoventilation syndrome ([place])    Primary hypertension 08/28/2018   Sleep apnea    Sleep apnea with use of continuous positive airway pressure (CPAP) 08/27/2013   Viral meningitis    Past Surgical History:  Procedure Laterality Date   CARDIAC CATHETERIZATION     CORONARY STENT INTERVENTION N/A 05/20/2020   Procedure: CORONARY STENT INTERVENTION;  Surgeon: Nelva Bush, MD;  Location: [facility];  Service: Cardiovascular;  Laterality: N/A;   LEFT HEART CATH AND CORONARY ANGIOGRAPHY N/A 05/20/2020   Procedure: LEFT HEART CATH AND CORONARY ANGIOGRAPHY;  Surgeon: Nelva Bush, MD;  Location: [facility];  Service: Cardiovascular;  Laterality: N/A;   Family History  Problem Relation Age of Onset   Alzheimer's  disease Mother     Social History   Tobacco Use   Smoking status: Never   Smokeless tobacco: Never  Substance Use Topics   Alcohol use: Yes    Comment: rarely   Marital Status: Divorced  ROS  Review of Systems  Cardiovascular:  Negative for chest pain, dyspnea on exertion and leg swelling.  Respiratory:  Positive for snoring (on CPAP).   Musculoskeletal:  Positive for gout.   Objective  Blood pressure (!) 150/94, pulse 68, SpO2 97 %.     09/11/2022    2:56 PM 07/31/2022    4:09 PM 07/31/2022    2:35 PM  Vitals with BMI  Height   5\' 10"   Weight   341 lbs  BMI   0000000  Systolic Q000111Q [id number] Q000111Q  Diastolic 94 72 90  Pulse 68  68     Physical Exam Constitutional:      Comments: He is well-built and morbidly obese in no acute distress.  Cardiovascular:     Rate and Rhythm: Normal rate and regular rhythm.     Pulses: Intact distal pulses.     Heart sounds: Normal heart sounds. No murmur heard.    No gallop.     Comments: No leg edema, no JVD. Pulmonary:     Effort: Pulmonary effort is normal.     Breath sounds: Normal breath sounds.  Abdominal:     General: Bowel sounds are normal.  Palpations: Abdomen is soft.     Comments: Pannus present  Musculoskeletal:        General: Swelling (Left great toe) and tenderness (Left great toe) present.    Laboratory examination:   No results for input(s): "NA", "K", "CL", "CO2", "GLUCOSE", "BUN", "CREATININE", "CALCIUM", "GFRNONAA", "GFRAA" in the last 8760 hours.  CrCl cannot be calculated (Patient's most recent lab result is older than the maximum 21 days allowed.).     Latest Ref Rng & Units 01/25/2021    8:45 AM 05/21/2020    7:01 AM 05/19/2020    4:39 PM  CMP  Glucose 70 - 99 mg/dL 81  103  109   BUN 6 - 23 mg/dL 22  9  14    Creatinine 0.40 - 1.50 mg/dL 0.99  0.94  0.93   Sodium 135 - 145 mEq/L 140  138  140   Potassium 3.5 - 5.1 mEq/L 4.4  3.8  3.8   Chloride 96 - 112 mEq/L 104  106  106   CO2 19 - 32 mEq/L 27  22  24     Calcium 8.4 - 10.5 mg/dL 9.3  9.0  9.5   Total Protein 6.0 - 8.3 g/dL 6.9     Total Bilirubin 0.2 - 1.2 mg/dL 0.7     Alkaline Phos 39 - 117 U/L 46     AST 0 - 37 U/L 20     ALT 0 - 53 U/L 23         Latest Ref Rng & Units 01/25/2021    8:45 AM 05/21/2020    7:01 AM 05/19/2020    4:39 PM  CBC  WBC 4.0 - 10.5 K/uL 7.4  8.9  8.1   Hemoglobin 13.0 - 17.0 g/dL 16.0  14.5  15.6   Hematocrit 39.0 - 52.0 % 48.8  44.4  47.5   Platelets 150.0 - 400.0 K/uL 150.0  175  188    Lipid Panel No results for input(s): "CHOL", "TRIG", "LDLCALC", "VLDL", "HDL", "CHOLHDL", "LDLDIRECT" in the last 8760 hours.   HEMOGLOBIN A1C Lab Results  Component Value Date   HGBA1C 5.5 01/25/2021   MPG 105.41 05/19/2020   TSH No results for input(s): "TSH" in the last 8760 hours.  Medications and allergies  No Known Allergies    Outpatient Medications Prior to Visit  Medication Sig Dispense Refill   acitretin (SORIATANE) 25 MG capsule Take 25 mg by mouth daily before breakfast.     ASPIRIN LOW DOSE 81 MG tablet TAKE 1 TABLET BY MOUTH EVERY DAY 30 tablet 8   atorvastatin (LIPITOR) 80 MG tablet TAKE 1 TABLET BY MOUTH EVERYDAY AT BEDTIME 90 tablet 2   bumetanide (BUMEX) 0.5 MG tablet TAKE 1 TABLET BY MOUTH EVERY DAY 30 tablet 5   diclofenac (VOLTAREN) 25 MG EC tablet Take 25 mg by mouth 2 (two) times daily.     MAGNESIUM PO Take by mouth.     metoprolol tartrate (LOPRESSOR) 25 MG tablet TAKE 1 TABLET BY MOUTH TWICE A DAY 180 tablet 2   Multiple Vitamin (MULTIVITAMIN) capsule Take 1 capsule by mouth daily.     nitroGLYCERIN (NITROSTAT) 0.4 MG SL tablet Place 1 tablet (0.4 mg total) under the tongue every 5 (five) minutes x 3 doses as needed for chest pain. 25 tablet 3   sildenafil (VIAGRA) 100 MG tablet Take 1 tablet (100 mg total) by mouth daily as needed for erectile dysfunction (Erectile dysfunction). 30 tablet 1   testosterone cypionate (DEPOTESTOSTERONE  CYPIONATE) 200 MG/ML injection INJECT 1 ML (200 MG  TOTAL) INTO THE MUSCLE EVERY 14 (FOURTEEN) DAYS. 7 mL 1   traZODone (DESYREL) 100 MG tablet TAKE 1 TABLET BY MOUTH EVERYDAY AT BEDTIME 90 tablet 1   valsartan (DIOVAN) 160 MG tablet TAKE 1 TABLET BY MOUTH EVERY DAY 90 tablet 3   Semaglutide,0.25 or 0.5MG /DOS, (OZEMPIC, 0.25 OR 0.5 MG/DOSE,) 2 MG/1.5ML SOPN Inject 0.5 mg into the skin once a week. 1.5 mL 5   No facility-administered medications prior to visit.   Meds ordered this encounter  Medications   colchicine 0.6 MG tablet    Sig: Take 1 tablet (0.6 mg total) by mouth 2 (two) times daily.    Dispense:  12 tablet    Refill:  0   Semaglutide, 1 MG/DOSE, 4 MG/3ML SOPN    Sig: Inject 1 mg into the skin once a week.    Dispense:  9 mL    Refill:  3   Medications Discontinued During This Encounter  Medication Reason   Semaglutide,0.25 or 0.5MG /DOS, (OZEMPIC, 0.25 OR 0.5 MG/DOSE,) 2 MG/1.5ML SOPN      Radiology:   No results found.  Cardiac Studies:   Coronary angiogram 05/20/2020 Severe single-vessel coronary artery disease with sequential 40-50% mid and 80-90% distal RCA stenoses.  The distal RCA lesion appears ulcerated and is likely the culprit lesion for the patient's NSTEMI. No angiographically significant coronary artery disease involving the left coronary artery. Low normal left ventricular systolic function with basal and mid inferior hypokinesis.  Left ventricular filling pressure is moderately elevated (LVEDP ~30 mmHg). Successful PCI to the distal RCA using Resolute Onyx 3.5 x 26 mm drug-eluting stent (postdilated to 4.1 mm) with 0% residual stenosis and TIMI-3 flow.  Echocardiogram 05/21/2020:  1. Left ventricular ejection fraction, by estimation, is 60%. The left  ventricle has normal function. The left ventricle has no regional wall  motion abnormalities. There is moderate left ventricular hypertrophy. Left  ventricular diastolic parameters are consistent with Grade II diastolic dysfunction (pseudonormalization).   2.  Right ventricular systolic function is normal. The right ventricular  size is normal. Tricuspid regurgitation signal is inadequate for assessing  PA pressure.   3. The mitral valve is grossly normal. Trivial mitral valve  regurgitation. No evidence of mitral stenosis.   4. The aortic valve is tricuspid. Aortic valve regurgitation is not visualized. No aortic stenosis is present.   5. The inferior vena cava is dilated in size with >50% respiratory  variability, suggesting right atrial pressure of 8 mmHg.  EKG  EKG 07/31/2021: Normal sinus rhythm with rate of 76 bpm, normal axis.  No evidence of ischemia, normal EKG. No significant change from EKG 01/09/2021   Assessment     ICD-10-CM   1. Primary hypertension  I10     2. White coat syndrome with diagnosis of hypertension  I10     3. Acute idiopathic gout involving toe of left foot  M10.072 diclofenac (VOLTAREN) 25 MG EC tablet    Uric acid    colchicine 0.6 MG tablet    4. Class 3 severe obesity due to excess calories with serious comorbidity and body mass index (BMI) of 50.0 to 59.9 in adult (HCC)  E66.01 Semaglutide, 1 MG/DOSE, 4 MG/3ML SOPN   Z68.43      Meds ordered this encounter  Medications   colchicine 0.6 MG tablet    Sig: Take 1 tablet (0.6 mg total) by mouth 2 (two) times daily.  Dispense:  12 tablet    Refill:  0   Semaglutide, 1 MG/DOSE, 4 MG/3ML SOPN    Sig: Inject 1 mg into the skin once a week.    Dispense:  9 mL    Refill:  3    Medications Discontinued During This Encounter  Medication Reason   Semaglutide,0.25 or 0.5MG /DOS, (OZEMPIC, 0.25 OR 0.5 MG/DOSE,) 2 MG/1.5ML SOPN    Orders Placed This Encounter  Procedures   Uric acid      Recommendations:   SAHAN KIDDER  is a 56 y.o. Caucasian male with hypertension, hyperlipidemia, morbid obesity and obstructive sleep apnea on CPAP and compliant, CAD presenting with NSTEMI S/P  angioplasty and stenting to the right coronary artery.    He presents here  for a 4-week follow-up of hypertension, also had severe pain and swelling in his left greater toe that started about 3 days ago, he spoke with "[facility]" and was prescribed NSAID.  Brings in blood pressure recordings from home and also his blood pressure apparatus.  His blood pressure apparatus readings correlated with ours.  Blood pressure is under excellent control.  He is elevated blood pressure today is related to both whitecoat hypertension and also recent acute gouty arthritis.  I prescribed him colchicine, will also obtain uric acid level.  Will forward my note to his PCP, if his symptoms do not improve he is advised to contact Dr. Dimas Chyle for follow-up.  He is presently on Bumex for diuretics for leg edema, if he continues to have recurrent episodes of gout, will have to discontinue this.  Patient is aware of the interaction.  His weight loss has plateaued since being on Ozempic, will increase the dose to 1 mg subcu q. 1 week.       Adrian Prows, MD, [facility] 09/11/2022, 4:59 PM Office: [phone] Fax: [phone] Pager: [phone]

## 2022-09-12 LAB — URIC ACID: Uric Acid: 6.5 mg/dL (ref 3.8–8.4)

## 2022-09-12 NOTE — Telephone Encounter (Signed)
From patient.

## 2022-09-17 ENCOUNTER — Other Ambulatory Visit: Payer: Self-pay | Admitting: Cardiology

## 2022-10-05 ENCOUNTER — Encounter: Payer: Self-pay | Admitting: Family Medicine

## 2022-10-05 ENCOUNTER — Ambulatory Visit (INDEPENDENT_AMBULATORY_CARE_PROVIDER_SITE_OTHER): Payer: 59 | Admitting: Family Medicine

## 2022-10-05 VITALS — BP 137/83 | HR 71 | Temp 97.5°F | Ht 70.0 in | Wt 311.0 lb

## 2022-10-05 DIAGNOSIS — Z1159 Encounter for screening for other viral diseases: Secondary | ICD-10-CM

## 2022-10-05 DIAGNOSIS — R739 Hyperglycemia, unspecified: Secondary | ICD-10-CM

## 2022-10-05 DIAGNOSIS — I10 Essential (primary) hypertension: Secondary | ICD-10-CM

## 2022-10-05 DIAGNOSIS — Z0001 Encounter for general adult medical examination with abnormal findings: Secondary | ICD-10-CM | POA: Diagnosis not present

## 2022-10-05 DIAGNOSIS — E785 Hyperlipidemia, unspecified: Secondary | ICD-10-CM

## 2022-10-05 DIAGNOSIS — Z125 Encounter for screening for malignant neoplasm of prostate: Secondary | ICD-10-CM

## 2022-10-05 DIAGNOSIS — G4700 Insomnia, unspecified: Secondary | ICD-10-CM

## 2022-10-05 DIAGNOSIS — R7989 Other specified abnormal findings of blood chemistry: Secondary | ICD-10-CM | POA: Diagnosis not present

## 2022-10-05 DIAGNOSIS — M109 Gout, unspecified: Secondary | ICD-10-CM

## 2022-10-05 LAB — COMPREHENSIVE METABOLIC PANEL
ALT: 31 U/L (ref 0–53)
AST: 23 U/L (ref 0–37)
Albumin: 4.7 g/dL (ref 3.5–5.2)
Alkaline Phosphatase: 51 U/L (ref 39–117)
BUN: 14 mg/dL (ref 6–23)
CO2: 26 mEq/L (ref 19–32)
Calcium: 9.5 mg/dL (ref 8.4–10.5)
Chloride: 105 mEq/L (ref 96–112)
Creatinine, Ser: 0.9 mg/dL (ref 0.40–1.50)
GFR: 96.04 mL/min (ref >60.00)
Glucose, Bld: 74 mg/dL (ref 70–99)
Potassium: 4.2 mEq/L (ref 3.5–5.1)
Sodium: 140 mEq/L (ref 135–145)
Total Bilirubin: 0.9 mg/dL (ref 0.2–1.2)
Total Protein: 7.1 g/dL (ref 6.0–8.3)

## 2022-10-05 LAB — LIPID PANEL
Cholesterol: 100 mg/dL (ref 0–200)
HDL: 32.6 mg/dL — ABNORMAL LOW (ref >39.00)
LDL Cholesterol: 45 mg/dL (ref 0–99)
NonHDL: 67.34
Total CHOL/HDL Ratio: 3
Triglycerides: 110 mg/dL (ref 0.0–149.0)
VLDL: 22 mg/dL (ref 0.0–40.0)

## 2022-10-05 LAB — PSA: PSA: 2.03 ng/mL (ref 0.10–4.00)

## 2022-10-05 LAB — TESTOSTERONE: Testosterone: 200.86 ng/dL — ABNORMAL LOW (ref 300.00–890.00)

## 2022-10-05 LAB — CBC
HCT: 45.7 % (ref 39.0–52.0)
Hemoglobin: 15.4 g/dL (ref 13.0–17.0)
MCHC: 33.6 g/dL (ref 30.0–36.0)
MCV: 85.2 fl (ref 78.0–100.0)
Platelets: 167 10*3/uL (ref 150.0–400.0)
RBC: 5.36 Mil/uL (ref 4.22–5.81)
RDW: 14.3 % (ref 11.5–15.5)
WBC: 6.3 10*3/uL (ref 4.0–10.5)

## 2022-10-05 LAB — URIC ACID: Uric Acid, Serum: 7.3 mg/dL (ref 4.0–7.8)

## 2022-10-05 LAB — TSH: TSH: 1.95 u[IU]/mL (ref 0.35–5.50)

## 2022-10-05 LAB — HEMOGLOBIN A1C: Hgb A1c MFr Bld: 5.2 % (ref 4.6–6.5)

## 2022-10-05 MED ORDER — GABAPENTIN 300 MG PO CAPS: 300.0000 mg | ORAL_CAPSULE | Freq: Every day | ORAL | 3 refills | Status: DC

## 2022-10-05 NOTE — Assessment & Plan Note (Signed)
Symptoms or not controlled.  Has tried hydroxyzine and trazodone without much improvement.  We will try gabapentin 300 mg nightly.  Has been on Ambien in the past but did not like next-day grogginess and other side effects.

## 2022-10-05 NOTE — Patient Instructions (Signed)
It was very nice to see you today!  We will check blood work today.  We may need to restart your testosterone depending on results.  Please try the gabapentin to help with your sleep.  See message in a few weeks on how this is doing.  I think the Ozempic is a great idea.  Please keep up the great work with your diet and exercise.  We will see you back in a year.  Come back to see [place] sooner if needed.  Take care, Dr [name]  PLEASE NOTE:  If you had any lab tests please let us know if you have not heard back within a few days. You may see your results on mychart before we have a chance to review them but we will give you a call once they are reviewed by [place]. If we ordered any referrals today, please let us know if you have not heard from their office within the next week.   Please try these tips to maintain a healthy lifestyle:  Eat at least 3 REAL meals and 1-2 snacks per day.  Aim for no more than 5 hours between eating.  If you eat breakfast, please do so within one hour of getting up.   Each meal should contain half fruits/vegetables, one quarter protein, and one quarter carbs (no bigger than a computer mouse)  Cut down on sweet beverages. This includes juice, soda, and sweet tea.   Drink at least 1 glass of water with each meal and aim for at least 8 glasses per day  Exercise at least 150 minutes every week.    Preventive Care 55-8 Years Old, Male Preventive care refers to lifestyle choices and visits with your health care provider that can promote health and wellness. Preventive care visits are also called wellness exams. What can I expect for my preventive care visit? Counseling During your preventive care visit, your health care provider may ask about your: Medical history, including: Past medical problems. Family medical history. Current health, including: Emotional well-being. Home life and relationship well-being. Sexual activity. Lifestyle, including: Alcohol,  nicotine or tobacco, and drug use. Access to firearms. Diet, exercise, and sleep habits. Safety issues such as seatbelt and bike helmet use. Sunscreen use. Work and work Astronomer. Physical exam Your health care provider will check your: Height and weight. These may be used to calculate your BMI (body mass index). BMI is a measurement that tells if you are at a healthy weight. Waist circumference. This measures the distance around your waistline. This measurement also tells if you are at a healthy weight and may help predict your risk of certain diseases, such as type 2 diabetes and high blood pressure. Heart rate and blood pressure. Body temperature. Skin for abnormal spots. What immunizations do I need?  Vaccines are usually given at various ages, according to a schedule. Your health care provider will recommend vaccines for you based on your age, medical history, and lifestyle or other factors, such as travel or where you work. What tests do I need? Screening Your health care provider may recommend screening tests for certain conditions. This may include: Lipid and cholesterol levels. Diabetes screening. This is done by checking your blood sugar (glucose) after you have not eaten for a while (fasting). Hepatitis B test. Hepatitis C test. HIV (human immunodeficiency virus) test. STI (sexually transmitted infection) testing, if you are at risk. Lung cancer screening. Prostate cancer screening. Colorectal cancer screening. Talk with your health care provider about your  test results, treatment options, and if necessary, the need for more tests. Follow these instructions at home: Eating and drinking  Eat a diet that includes fresh fruits and vegetables, whole grains, lean protein, and low-fat dairy products. Take vitamin and mineral supplements as recommended by your health care provider. Do not drink alcohol if your health care provider tells you not to drink. If you drink  alcohol: Limit how much you have to 0-2 drinks a day. Know how much alcohol is in your drink. In the [place] one drink equals one 12 oz bottle of beer (355 mL), one 5 oz glass of wine (148 mL), or one 1 oz glass of hard liquor (44 mL). Lifestyle Brush your teeth every morning and night with fluoride toothpaste. Floss one time each day. Exercise for at least 30 minutes 5 or more days each week. Do not use any products that contain nicotine or tobacco. These products include cigarettes, chewing tobacco, and vaping devices, such as e-cigarettes. If you need help quitting, ask your health care provider. Do not use drugs. If you are sexually active, practice safe sex. Use a condom or other form of protection to prevent STIs. Take aspirin only as told by your health care provider. Make sure that you understand how much to take and what form to take. Work with your health care provider to find out whether it is safe and beneficial for you to take aspirin daily. Find healthy ways to manage stress, such as: Meditation, yoga, or listening to music. Journaling. Talking to a trusted person. Spending time with friends and family. Minimize exposure to UV radiation to reduce your risk of skin cancer. Safety Always wear your seat belt while driving or riding in a vehicle. Do not drive: If you have been drinking alcohol. Do not ride with someone who has been drinking. When you are tired or distracted. While texting. If you have been using any mind-altering substances or drugs. Wear a helmet and other protective equipment during sports activities. If you have firearms in your house, make sure you follow all gun safety procedures. What's next? Go to your health care provider once a year for an annual wellness visit. Ask your health care provider how often you should have your eyes and teeth checked. Stay up to date on all vaccines. This information is not intended to replace advice given to you by your  health care provider. Make sure you discuss any questions you have with your health care provider. Document Revised: 05/03/2021 Document Reviewed: 05/03/2021 Elsevier Patient Education  2023 [employer].

## 2022-10-05 NOTE — Assessment & Plan Note (Signed)
BMI 44 today.  Congratulated patient on weight loss.  Lost about 30 pounds over the last few weeks.  We had him on Ozempic previously however he discontinued this.  His cardiologist restarted him on this recently and he is now on 1 mg weekly.  Seems to be doing well.  Continue current dose though would consider escalating to 2 mg weekly dose at some point in the future.

## 2022-10-05 NOTE — Assessment & Plan Note (Signed)
Follows with cardiology.  On Lipitor 80 mg daily.  We will check labs today.

## 2022-10-05 NOTE — Progress Notes (Signed)
3  Chief Complaint:  Michael [name] is a 56 y.o. male who presents today for his annual comprehensive physical exam.    Assessment/Plan:  Chronic Problems Addressed Today: Obesity, morbid (HCC) BMI 44 today.  Congratulated patient on weight loss.  Lost about 30 pounds over the last few weeks.  We had him on Ozempic previously however he discontinued this.  His cardiologist restarted him on this recently and he is now on 1 mg weekly.  Seems to be doing well.  Continue current dose though would consider escalating to 2 mg weekly dose at some point in the future.   Insomnia Symptoms or not controlled.  Has tried hydroxyzine and trazodone without much improvement.  We will try gabapentin 300 mg nightly.  Has been on Ambien in the past but did not like next-day grogginess and other side effects.  Gout Recently had flare that was treated by cardiology.  We will check uric acid level today.  Hyperlipidemia LDL goal <70 Follows with cardiology.  On Lipitor 80 mg daily.  We will check labs today.  Primary hypertension Blood pressure at goal today on valsartan 160 mg daily and metoprolol tartrate 25 mg twice daily.  Low testosterone He has been off of testosterone for the last several months.  This could be contributing some to his fatigue.  We will recheck testosterone level today.  May consider restarting depending on results.  Preventative Healthcare: Check labs.  Up-to-date on vaccines.  Patient Counseling(The following topics were reviewed and/or handout was given):  -Nutrition: Stressed importance of moderation in sodium/caffeine intake, saturated fat and cholesterol, caloric balance, sufficient intake of fresh fruits, vegetables, and fiber.  -Stressed the importance of regular exercise.   -Substance Abuse: Discussed cessation/primary prevention of tobacco, alcohol, or other drug use; driving or other dangerous activities under the influence; availability of treatment for abuse.   -Injury  prevention: Discussed safety belts, safety helmets, smoke detector, smoking near bedding or upholstery.   -Sexuality: Discussed sexually transmitted diseases, partner selection, use of condoms, avoidance of unintended pregnancy and contraceptive alternatives.   -Dental health: Discussed importance of regular tooth brushing, flossing, and dental visits.  -Health maintenance and immunizations reviewed. Please refer to Health maintenance section.  Return to care in 1 year for next preventative visit.     Subjective:  HPI:  He has no acute complaints today.   Lifestyle Diet: Balanced. Plenty of vegetables.  Exercise: Trying to walk more.      10/05/2022    1:05 PM  Depression screen PHQ 2/9  Decreased Interest 0  Down, Depressed, Hopeless 0  PHQ - 2 Score 0    Health Maintenance Due  Topic Date Due   Hepatitis C Screening  Never done     ROS: Per HPI, otherwise a complete review of systems was negative.   PMH:  The following were reviewed and entered/updated in epic: Past Medical History:  Diagnosis Date   (HFpEF) heart failure with preserved ejection fraction (HCC)    a. 05/2020 Echo: EF 60%, no rwma, Gr2 DD. Triv MR.   CAD (coronary artery disease)    a. 04/2020 NSTEMI/PCI: LM nl, LAD nl, LCX nl, RCA 66m, 85d (3.6x26 Resolute Onyx DES), RPDA nl, RPAV nl, EF 50-55%.   Hyperlipidemia LDL goal <70 08/28/2018   Obesity hypoventilation syndrome (HCC)    Primary hypertension 08/28/2018   Sleep apnea    Sleep apnea with use of continuous positive airway pressure (CPAP) 08/27/2013   Viral meningitis  Patient Active Problem List   Diagnosis Date Noted   Gout 10/05/2022   Low testosterone 08/16/2020   CAD (coronary artery disease) s/p NSTEMI 2021 05/21/2020   (HFpEF) heart failure with preserved ejection fraction (HCC) 05/21/2020   Dyshidrosis 08/31/2019   Insomnia 08/31/2019   Hyperlipidemia LDL goal <70 08/28/2018   Erectile dysfunction 08/28/2018   Primary  hypertension 08/28/2018   Obesity, morbid (HCC) 02/24/2014   OSA treated with BiPAP 08/27/2013   Nocturia 08/27/2013   Obesity hypoventilation syndrome (HCC)    Past Surgical History:  Procedure Laterality Date   CARDIAC CATHETERIZATION     CORONARY STENT INTERVENTION N/A 05/20/2020   Procedure: CORONARY STENT INTERVENTION;  Surgeon: Yvonne Kendall, MD;  Location: [facility];  Service: Cardiovascular;  Laterality: N/A;   LEFT HEART CATH AND CORONARY ANGIOGRAPHY N/A 05/20/2020   Procedure: LEFT HEART CATH AND CORONARY ANGIOGRAPHY;  Surgeon: Yvonne Kendall, MD;  Location: [facility];  Service: Cardiovascular;  Laterality: N/A;    Family History  Problem Relation Age of Onset   Alzheimer's disease Mother     Medications- reviewed and updated Current Outpatient Medications  Medication Sig Dispense Refill   acitretin (SORIATANE) 25 MG capsule Take 25 mg by mouth daily before breakfast.     ASPIRIN LOW DOSE 81 MG tablet TAKE 1 TABLET BY MOUTH EVERY DAY 30 tablet 8   atorvastatin (LIPITOR) 80 MG tablet TAKE 1 TABLET BY MOUTH EVERYDAY AT BEDTIME 90 tablet 2   bumetanide (BUMEX) 0.5 MG tablet TAKE 1 TABLET BY MOUTH EVERY DAY 30 tablet 5   gabapentin (NEURONTIN) 300 MG capsule Take 1 capsule (300 mg total) by mouth at bedtime. 90 capsule 3   MAGNESIUM PO Take by mouth.     metoprolol tartrate (LOPRESSOR) 25 MG tablet TAKE 1 TABLET BY MOUTH TWICE A DAY 180 tablet 1   Multiple Vitamin (MULTIVITAMIN) capsule Take 1 capsule by mouth daily.     nitroGLYCERIN (NITROSTAT) 0.4 MG SL tablet Place 1 tablet (0.4 mg total) under the tongue every 5 (five) minutes x 3 doses as needed for chest pain. 25 tablet 3   Semaglutide, 1 MG/DOSE, 4 MG/3ML SOPN Inject 1 mg into the skin once a week. 9 mL 3   testosterone cypionate (DEPOTESTOSTERONE CYPIONATE) 200 MG/ML injection INJECT 1 ML (200 MG TOTAL) INTO THE MUSCLE EVERY 14 (FOURTEEN) DAYS. 7 mL 1   valsartan (DIOVAN) 160 MG tablet TAKE 1 TABLET  BY MOUTH EVERY DAY 90 tablet 3   sildenafil (VIAGRA) 100 MG tablet Take 1 tablet (100 mg total) by mouth daily as needed for erectile dysfunction (Erectile dysfunction). (Patient not taking: Reported on 10/05/2022) 30 tablet 1   No current facility-administered medications for this visit.    Allergies-reviewed and updated No Known Allergies  Social History   Socioeconomic History   Marital status: Divorced    Spouse name: Not on file   Number of children: 1   Years of education: 14   Highest education level: Not on file  Occupational History    Employer: [employer]  Tobacco Use   Smoking status: Never   Smokeless tobacco: Never  Substance and Sexual Activity   Alcohol use: Yes    Comment: rarely   Drug use: No   Sexual activity: Not on file  Other Topics Concern   Not on file  Social History Narrative   Patient is single and lives alone.   Patient is working full-time.   Patient has a college  education.   Patient is right-handed.   Patient drinks 3-4 cups of coffee daily.   Social Determinants of Health   Financial Resource Strain: Not on file  Food Insecurity: Not on file  Transportation Needs: Not on file  Physical Activity: Not on file  Stress: Not on file  Social Connections: Not on file        Objective:  Physical Exam: BP 137/83   Pulse 71   Temp (!) 97.5 F (36.4 C) (Temporal)   Ht 5\' 10"  (1.778 m)   Wt (!) 311 lb (141.1 kg)   SpO2 96%   BMI 44.62 kg/m   Body mass index is 44.62 kg/m. Wt Readings from Last 3 Encounters:  10/05/22 (!) 311 lb (141.1 kg)  07/31/22 (!) 341 lb (154.7 kg)  08/29/21 (!) 310 lb (140.6 kg)   Gen: NAD, resting comfortably HEENT: TMs normal bilaterally. OP clear. No thyromegaly noted.  CV: RRR with no murmurs appreciated Pulm: NWOB, CTAB with no crackles, wheezes, or rhonchi GI: Normal bowel sounds present. Soft, Nontender, Nondistended. MSK: no edema, cyanosis, or clubbing noted Skin: warm, dry Neuro: CN2-12 grossly  intact. Strength 5/5 in upper and lower extremities. Reflexes symmetric and intact bilaterally.  Psych: Normal affect and thought content     Ariel Dimitri M. 10/29/21, MD 10/05/2022 1:29 PM

## 2022-10-05 NOTE — Assessment & Plan Note (Signed)
Blood pressure at goal today on valsartan 160 mg daily and metoprolol tartrate 25 mg twice daily.

## 2022-10-05 NOTE — Assessment & Plan Note (Signed)
He has been off of testosterone for the last several months.  This could be contributing some to his fatigue.  We will recheck testosterone level today.  May consider restarting depending on results.

## 2022-10-05 NOTE — Assessment & Plan Note (Signed)
Recently had flare that was treated by cardiology.  We will check uric acid level today.

## 2022-10-06 LAB — HEPATITIS C ANTIBODY: Hepatitis C Ab: NONREACTIVE

## 2022-10-09 NOTE — Progress Notes (Signed)
Please inform patient of the following:  His testosterone is still low.  We can restart replacement if he is interested.  We should recheck in 3 to 6 months if he decides to do this.  All of his other labs are stable.  We can recheck in a year.

## 2022-10-17 MED ORDER — TESTOSTERONE CYPIONATE 200 MG/ML IM SOLN: 200.0000 mg | INTRAMUSCULAR | 1 refills | Status: DC

## 2022-10-17 NOTE — Addendum Note (Signed)
Addended by: Ardith Dark on: 10/17/2022 10:42 AM   Modules accepted: Orders

## 2022-10-23 ENCOUNTER — Ambulatory Visit: Payer: 59 | Admitting: Cardiology

## 2022-10-23 ENCOUNTER — Encounter: Payer: Self-pay | Admitting: Cardiology

## 2022-10-23 VITALS — BP 135/80 | HR 80 | Resp 12 | Ht 70.0 in | Wt 311.8 lb

## 2022-10-23 DIAGNOSIS — I25118 Atherosclerotic heart disease of native coronary artery with other forms of angina pectoris: Secondary | ICD-10-CM

## 2022-10-23 DIAGNOSIS — E66813 Obesity, class 3: Secondary | ICD-10-CM

## 2022-10-23 DIAGNOSIS — I10 Essential (primary) hypertension: Secondary | ICD-10-CM

## 2022-10-23 MED ORDER — OZEMPIC (2 MG/DOSE) 8 MG/3ML ~~LOC~~ SOPN: 2.0000 mg | PEN_INJECTOR | SUBCUTANEOUS | 1 refills | Status: DC

## 2022-10-23 NOTE — Progress Notes (Signed)
Primary Physician/Referring:  Ardith Dark, MD  Patient ID: Michael [name], male    DOB: December 08, 1965, 56 y.o.   MRN: [id number]  No chief complaint on file.  HPI:    Michael [name]  is a 56 y.o. Caucasian male with hypertension, hyperlipidemia, morbid obesity and obstructive sleep apnea on CPAP and compliant, CAD presenting with NSTEMI S/P  angioplasty and stenting to the right coronary artery.  He now presents here for management of hypertension and specifically obesity.  No chest pain, no dyspnea.  Has lost 20 pounds since being on Ozempic.  Past Medical History:  Diagnosis Date   (HFpEF) heart failure with preserved ejection fraction (HCC)    a. 05/2020 Echo: EF 60%, no rwma, Gr2 DD. Triv MR.   CAD (coronary artery disease)    a. 04/2020 NSTEMI/PCI: LM nl, LAD nl, LCX nl, RCA 44m, 85d (3.6x26 Resolute Onyx DES), RPDA nl, RPAV nl, EF 50-55%.   Hyperlipidemia LDL goal <70 08/28/2018   Obesity hypoventilation syndrome (HCC)    Primary hypertension 08/28/2018   Sleep apnea    Sleep apnea with use of continuous positive airway pressure (CPAP) 08/27/2013   Viral meningitis    Past Surgical History:  Procedure Laterality Date   CARDIAC CATHETERIZATION     CORONARY STENT INTERVENTION N/A 05/20/2020   Procedure: CORONARY STENT INTERVENTION;  Surgeon: Yvonne Kendall, MD;  Location: [facility];  Service: Cardiovascular;  Laterality: N/A;   LEFT HEART CATH AND CORONARY ANGIOGRAPHY N/A 05/20/2020   Procedure: LEFT HEART CATH AND CORONARY ANGIOGRAPHY;  Surgeon: Yvonne Kendall, MD;  Location: [facility];  Service: Cardiovascular;  Laterality: N/A;   Family History  Problem Relation Age of Onset   Alzheimer's disease Mother     Social History   Tobacco Use   Smoking status: Never   Smokeless tobacco: Never  Substance Use Topics   Alcohol use: Yes    Comment: rarely   Marital Status: Divorced  ROS  Review of Systems  Cardiovascular:  Negative for chest pain, dyspnea  on exertion and leg swelling.  Respiratory:  Positive for snoring (on CPAP).   Musculoskeletal:  Negative for gout.   Objective  Blood pressure 135/80, pulse 80, resp. rate 12, height 5\' 10"  (1.778 m), weight (!) 311 lb 12.8 oz (141.4 kg), SpO2 96 %.     10/23/2022    2:58 PM 10/05/2022    1:08 PM 09/11/2022    2:56 PM  Vitals with BMI  Height 5\' 10"  5\' 10"    Weight 311 lbs 13 oz 311 lbs   BMI 44.74 44.62   Systolic 135 137 09/13/2022  Diastolic 80 83 94  Pulse 80 71 68     Physical Exam Constitutional:      Comments: He is well-built and morbidly obese in no acute distress.  Cardiovascular:     Rate and Rhythm: Normal rate and regular rhythm.     Pulses: Intact distal pulses.     Heart sounds: Normal heart sounds. No murmur heard.    No gallop.     Comments: No leg edema, no JVD. Pulmonary:     Effort: Pulmonary effort is normal.     Breath sounds: Normal breath sounds.  Abdominal:     General: Bowel sounds are normal.     Palpations: Abdomen is soft.     Comments: Pannus present  Musculoskeletal:     Right lower leg: No edema.     Left lower leg:  No edema.    Laboratory examination:   Lab Results  Component Value Date   NA 140 10/05/2022   K 4.2 10/05/2022   CO2 26 10/05/2022   GLUCOSE 74 10/05/2022   BUN 14 10/05/2022   CREATININE 0.90 10/05/2022   CALCIUM 9.5 10/05/2022   GFRNONAA >60 05/21/2020       Latest Ref Rng & Units 10/05/2022    1:28 PM 01/25/2021    8:45 AM 05/21/2020    7:01 AM  CMP  Glucose 70 - 99 mg/dL 74  81  315   BUN 6 - 23 mg/dL 14  22  9    Creatinine 0.40 - 1.50 mg/dL  9.45  8.59   Sodium 135 - 145 mEq/L 140  140  138   Potassium 3.5 - 5.1 mEq/L 4.2  4.4  3.8   Chloride 96 - 112 mEq/L 105  104  106   CO2 19 - 32 mEq/L 26  27  22    Calcium 8.4 - 10.5 mg/dL 9.5  9.3  9.0   Total Protein 6.0 - 8.3 g/dL 7.1  6.9    Total Bilirubin 0.2 - 1.2 mg/dL 0.9  0.7    Alkaline Phos 39 - 117 U/L 51  46    AST 0 - 37 U/L 23  20    ALT 0 - 53 U/L  31  23        Latest Ref Rng & Units 10/05/2022    1:28 PM 01/25/2021    8:45 AM 05/21/2020    7:01 AM  CBC  WBC 4.0 - 10.5 K/uL 6.3  7.4  8.9   Hemoglobin 13.0 - 17.0 g/dL 03/27/2021  07/22/2020  44.6   Hematocrit 39.0 - 52.0 % 45.7  48.8  44.4   Platelets 150.0 - 400.0 K/uL 167.0  150.0  175    Lipid Panel Recent Labs    10/05/22 1328  CHOL 100  TRIG 110.0  LDLCALC 45  VLDL 22.0  HDL 32.60*  CHOLHDL 3     HEMOGLOBIN A1C Lab Results  Component Value Date   HGBA1C 5.2 10/05/2022   MPG 105.41 05/19/2020   TSH Recent Labs    10/05/22 1328  TSH 1.95    Medications and allergies  No Known Allergies   Current Outpatient Medications:    acitretin (SORIATANE) 25 MG capsule, Take 25 mg by mouth daily before breakfast., Disp: , Rfl:    ASPIRIN LOW DOSE 81 MG tablet, TAKE 1 TABLET BY MOUTH EVERY DAY, Disp: 30 tablet, Rfl: 8   atorvastatin (LIPITOR) 80 MG tablet, TAKE 1 TABLET BY MOUTH EVERYDAY AT BEDTIME, Disp: 90 tablet, Rfl: 2   bumetanide (BUMEX) 0.5 MG tablet, TAKE 1 TABLET BY MOUTH EVERY DAY, Disp: 30 tablet, Rfl: 5   gabapentin (NEURONTIN) 300 MG capsule, Take 1 capsule (300 mg total) by mouth at bedtime., Disp: 90 capsule, Rfl: 3   MAGNESIUM PO, Take by mouth., Disp: , Rfl:    metoprolol tartrate (LOPRESSOR) 25 MG tablet, TAKE 1 TABLET BY MOUTH TWICE A DAY, Disp: 180 tablet, Rfl: 1   Multiple Vitamin (MULTIVITAMIN) capsule, Take 1 capsule by mouth daily., Disp: , Rfl:    Semaglutide, 2 MG/DOSE, (OZEMPIC, 2 MG/DOSE,) 8 MG/3ML SOPN, Inject 2 mg into the skin once a week., Disp: 9 mL, Rfl: 1   sildenafil (VIAGRA) 100 MG tablet, Take 1 tablet (100 mg total) by mouth daily as needed for erectile dysfunction (Erectile dysfunction)., Disp: 30 tablet, Rfl: 1  testosterone cypionate (DEPOTESTOSTERONE CYPIONATE) 200 MG/ML injection, Inject 1 mL (200 mg total) into the muscle every 14 (fourteen) days., Disp: 7 mL, Rfl: 1   valsartan (DIOVAN) 160 MG tablet, TAKE 1 TABLET BY MOUTH EVERY DAY,  Disp: 90 tablet, Rfl: 3   nitroGLYCERIN (NITROSTAT) 0.4 MG SL tablet, Place 1 tablet (0.4 mg total) under the tongue every 5 (five) minutes x 3 doses as needed for chest pain. (Patient not taking: Reported on 10/23/2022), Disp: 25 tablet, Rfl: 3   Meds ordered this encounter  Medications   Semaglutide, 2 MG/DOSE, (OZEMPIC, 2 MG/DOSE,) 8 MG/3ML SOPN    Sig: Inject 2 mg into the skin once a week.    Dispense:  9 mL    Refill:  1   Medications Discontinued During This Encounter  Medication Reason   Semaglutide, 1 MG/DOSE, 4 MG/3ML SOPN Dose change     Radiology:   No results found.  Cardiac Studies:   Coronary angiogram 05/20/2020 Severe single-vessel coronary artery disease with sequential 40-50% mid and 80-90% distal RCA stenoses.  The distal RCA lesion appears ulcerated and is likely the culprit lesion for the patient's NSTEMI. No angiographically significant coronary artery disease involving the left coronary artery. Low normal left ventricular systolic function with basal and mid inferior hypokinesis.  Left ventricular filling pressure is moderately elevated (LVEDP ~30 mmHg). Successful PCI to the distal RCA using Resolute Onyx 3.5 x 26 mm drug-eluting stent (postdilated to 4.1 mm) with 0% residual stenosis and TIMI-3 flow.  Echocardiogram 05/21/2020:  1. Left ventricular ejection fraction, by estimation, is 60%. The left  ventricle has normal function. The left ventricle has no regional wall  motion abnormalities. There is moderate left ventricular hypertrophy. Left  ventricular diastolic parameters are consistent with Grade II diastolic dysfunction (pseudonormalization).   2. Right ventricular systolic function is normal. The right ventricular  size is normal. Tricuspid regurgitation signal is inadequate for assessing  PA pressure.   3. The mitral valve is grossly normal. Trivial mitral valve  regurgitation. No evidence of mitral stenosis.   4. The aortic valve is tricuspid. Aortic  valve regurgitation is not visualized. No aortic stenosis is present.   5. The inferior vena cava is dilated in size with >50% respiratory  variability, suggesting right atrial pressure of 8 mmHg.  EKG  EKG 07/31/2021: Normal sinus rhythm with rate of 76 bpm, normal axis.  No evidence of ischemia, normal EKG. No significant change from EKG 01/09/2021   Assessment     ICD-10-CM   1. Primary hypertension  I10     2. Class 3 severe obesity due to excess calories with serious comorbidity and body mass index (BMI) of 50.0 to 59.9 in adult (HCC)  E66.01 Semaglutide, 2 MG/DOSE, (OZEMPIC, 2 MG/DOSE,) 8 MG/3ML SOPN   Z68.43     3. Coronary artery disease of native artery of native heart with stable angina pectoris (HCC)  I25.118 Semaglutide, 2 MG/DOSE, (OZEMPIC, 2 MG/DOSE,) 8 MG/3ML SOPN     Meds ordered this encounter  Medications   Semaglutide, 2 MG/DOSE, (OZEMPIC, 2 MG/DOSE,) 8 MG/3ML SOPN    Sig: Inject 2 mg into the skin once a week.    Dispense:  9 mL    Refill:  1    Medications Discontinued During This Encounter  Medication Reason   Semaglutide, 1 MG/DOSE, 4 MG/3ML SOPN Dose change   No orders of the defined types were placed in this encounter.    Ozempic.  He is tolerating.  Specifically obesity Recommendations:   Michael Alexandriaric L Belzer  is a 56 y.o. Caucasian male with hypertension, hyperlipidemia, morbid obesity and obstructive sleep apnea on CPAP and compliant, CAD presenting with NSTEMI S/P  angioplasty and stenting to the right coronary artery.  He now presents here for management of hypertension and specifically obesity.  1. Primary hypertension Blood pressure is presently well-controlled.  Continue metoprolol and valsartan.  2. Class 3 severe obesity due to excess calories with serious comorbidity and body mass index (BMI) of 50.0 to 59.9 in adult [facility](HCC) He presents here for a 6-week follow-up of hypertension and weight loss on Ozempic. He has a total of 25-29 Lbs weight loss.  Just returned from vacation and has gained some weight but plans to go back on diet again.  I will increase the dose of Ozempic to 2 mg subcu every week.  3. Coronary artery disease of native artery of native heart with stable angina pectoris (HCC) Remains angina free, presently tolerating all his medications, lipids under excellent control.  He wishes to see me back in 6 weeks.   Yates DecampJay Corley Maffeo, MD, [facility] 10/23/2022, 3:27 PM Office: [phone] Fax: [phone] Pager: [phone]

## 2022-10-31 ENCOUNTER — Ambulatory Visit: Payer: 59 | Admitting: Physician Assistant

## 2022-10-31 ENCOUNTER — Encounter: Payer: Self-pay | Admitting: Physician Assistant

## 2022-10-31 VITALS — BP 150/100 | HR 65 | Temp 98.0°F | Ht 70.0 in | Wt 314.5 lb

## 2022-10-31 DIAGNOSIS — J029 Acute pharyngitis, unspecified: Secondary | ICD-10-CM | POA: Diagnosis not present

## 2022-10-31 LAB — POCT RAPID STREP A (OFFICE): Rapid Strep A Screen: POSITIVE — AB

## 2022-10-31 MED ORDER — AMOXICILLIN-POT CLAVULANATE 875-125 MG PO TABS: 1.0000 | ORAL_TABLET | Freq: Two times a day (BID) | ORAL | 0 refills | Status: DC

## 2022-10-31 NOTE — Progress Notes (Signed)
Michael [name] is a 56 y.o. male here for a new problem.  History of Present Illness:   Chief Complaint  Patient presents with   Sore Throat    Pt c/o sore throat since last Friday   Cough    Pt c/o cough, chest congestion, expectorating thick white to light yellow sputum. Nasal congestion and headaches, sinus pressure.    HPI  Sore throat Onset x5 days, worsened significantly the following day. Accompanying cough with thick white to yellow sputum, chest congestion, nasal congestion, headaches, and sinus pressure/behind his eyes x3 days. Progressively worsened. Accompanying chills and wheezing.  Had telehealth visit and started on Prednisone 40mg  daily x2 days and Albuterol inhaler prn. Staying well hydrated.  He denies any fevers, chest pain, SOB. Has had x2 negative home COVID tests since onset of symptoms. Denies any known sick contacts but was told by his work HR that several coworkers have had COVID recently.   Past Medical History:  Diagnosis Date   (HFpEF) heart failure with preserved ejection fraction (HCC)    a. 05/2020 Echo: EF 60%, no rwma, Gr2 DD. Triv MR.   CAD (coronary artery disease)    a. 04/2020 NSTEMI/PCI: LM nl, LAD nl, LCX nl, RCA 4m, 85d (3.6x26 Resolute Onyx DES), RPDA nl, RPAV nl, EF 50-55%.   Hyperlipidemia LDL goal <70 08/28/2018   Obesity hypoventilation syndrome (HCC)    Primary hypertension 08/28/2018   Sleep apnea    Sleep apnea with use of continuous positive airway pressure (CPAP) 08/27/2013   Viral meningitis      Social History   Tobacco Use   Smoking status: Never   Smokeless tobacco: Never  Vaping Use   Vaping Use: Never used  Substance Use Topics   Alcohol use: Yes    Comment: rarely   Drug use: No    Past Surgical History:  Procedure Laterality Date   CARDIAC CATHETERIZATION     CORONARY STENT INTERVENTION N/A 05/20/2020   Procedure: CORONARY STENT INTERVENTION;  Surgeon: 07/21/2020, MD;  Location: [facility];   Service: Cardiovascular;  Laterality: N/A;   LEFT HEART CATH AND CORONARY ANGIOGRAPHY N/A 05/20/2020   Procedure: LEFT HEART CATH AND CORONARY ANGIOGRAPHY;  Surgeon: 07/21/2020, MD;  Location: [facility];  Service: Cardiovascular;  Laterality: N/A;    Family History  Problem Relation Age of Onset   Alzheimer's disease Mother     No Known Allergies  Current Medications:   Current Outpatient Medications:    acitretin (SORIATANE) 25 MG capsule, Take 25 mg by mouth daily before breakfast., Disp: , Rfl:    albuterol (VENTOLIN HFA) 108 (90 Base) MCG/ACT inhaler, Inhale 1-2 puffs into the lungs every 4 (four) hours as needed., Disp: , Rfl:    amoxicillin-clavulanate (AUGMENTIN) 875-125 MG tablet, Take 1 tablet by mouth 2 (two) times daily., Disp: 14 tablet, Rfl: 0   ASPIRIN LOW DOSE 81 MG tablet, TAKE 1 TABLET BY MOUTH EVERY DAY, Disp: 30 tablet, Rfl: 8   atorvastatin (LIPITOR) 80 MG tablet, TAKE 1 TABLET BY MOUTH EVERYDAY AT BEDTIME, Disp: 90 tablet, Rfl: 2   bumetanide (BUMEX) 0.5 MG tablet, TAKE 1 TABLET BY MOUTH EVERY DAY, Disp: 30 tablet, Rfl: 5   gabapentin (NEURONTIN) 300 MG capsule, Take 1 capsule (300 mg total) by mouth at bedtime., Disp: 90 capsule, Rfl: 3   MAGNESIUM PO, Take by mouth., Disp: , Rfl:    metoprolol tartrate (LOPRESSOR) 25 MG tablet, TAKE 1 TABLET BY  MOUTH TWICE A DAY, Disp: 180 tablet, Rfl: 1   Multiple Vitamin (MULTIVITAMIN) capsule, Take 1 capsule by mouth daily., Disp: , Rfl:    nitroGLYCERIN (NITROSTAT) 0.4 MG SL tablet, Place 1 tablet (0.4 mg total) under the tongue every 5 (five) minutes x 3 doses as needed for chest pain., Disp: 25 tablet, Rfl: 3   predniSONE (DELTASONE) 20 MG tablet, Take 40 mg by mouth every morning., Disp: , Rfl:    Semaglutide, 1 MG/DOSE, (OZEMPIC, 1 MG/DOSE,) 4 MG/3ML SOPN, Inject 1 mg into the skin once a week., Disp: , Rfl:    sildenafil (VIAGRA) 100 MG tablet, Take 1 tablet (100 mg total) by mouth daily as needed for erectile  dysfunction (Erectile dysfunction)., Disp: 30 tablet, Rfl: 1   testosterone cypionate (DEPOTESTOSTERONE CYPIONATE) 200 MG/ML injection, Inject 1 mL (200 mg total) into the muscle every 14 (fourteen) days., Disp: 7 mL, Rfl: 1   valsartan (DIOVAN) 160 MG tablet, TAKE 1 TABLET BY MOUTH EVERY DAY, Disp: 90 tablet, Rfl: 3   Semaglutide, 2 MG/DOSE, (OZEMPIC, 2 MG/DOSE,) 8 MG/3ML SOPN, Inject 2 mg into the skin once a week. (Patient not taking: Reported on 10/31/2022), Disp: 9 mL, Rfl: 1   Review of Systems:   Review of Systems  Constitutional:  Positive for chills. Negative for fever.  HENT:  Positive for congestion, sinus pain and sore throat.   Respiratory:  Positive for cough and wheezing.   Neurological:  Positive for headaches.    Vitals:   Vitals:   10/31/22 1125 10/31/22 1144  BP: (!) 140/90 (!) 150/100  Pulse: 65   Temp: 98 F (36.7 C)   TempSrc: Temporal   SpO2: 98%   Weight: (!) 314 lb 8 oz (142.7 kg)   Height: 5\' 10"  (1.778 m)      Body mass index is 45.13 kg/m.  Physical Exam:   Physical Exam Vitals and nursing note reviewed.  Constitutional:      General: He is not in acute distress.    Appearance: He is well-developed. He is not ill-appearing or toxic-appearing.  Cardiovascular:     Rate and Rhythm: Normal rate and regular rhythm.     Pulses: Normal pulses.     Heart sounds: Normal heart sounds, S1 normal and S2 normal.  Pulmonary:     Effort: Pulmonary effort is normal.     Breath sounds: Normal breath sounds.  Skin:    General: Skin is warm and dry.  Neurological:     Mental Status: He is alert.     GCS: GCS eye subscore is 4. GCS verbal subscore is 5. GCS motor subscore is 6.  Psychiatric:        Speech: Speech normal.        Behavior: Behavior normal. Behavior is cooperative.    Results for orders placed or performed in visit on 10/31/22  POCT rapid strep A  Result Value Ref Range   Rapid Strep A Screen Positive (A) Negative     Assessment and  Plan:   Sore throat No red flags on exam. Strep positive. Will initiate augmentin for strep and sinusitis per orders.  I recommended continued use of inhaler prn and steroids as prescribed. Discussed taking medications as prescribed. Reviewed return precautions including worsening fever, SOB, worsening cough or other concerns. Push fluids and rest. I recommend that patient follow-up if symptoms worsen or persist despite treatment x 7-10 days, sooner if needed.  I,Alexis Herring,acting as a 11/02/22 for Neurosurgeon, PA.,have  documented all relevant documentation on the behalf of [name], PA,as directed by  [name], PA while in the presence of [name], [place].  I, [name], [place], have reviewed all documentation for this visit. The documentation on 10/31/22 for the exam, diagnosis, procedures, and orders are all accurate and complete.  [name], PA-C

## 2022-10-31 NOTE — Patient Instructions (Addendum)
It was great to see you!  Start augmentin antibiotic  Continue prednisone  Follow-up if new/worsening symptoms  Take care,  [name] PA-C

## 2022-11-01 ENCOUNTER — Encounter: Payer: Self-pay | Admitting: Physician Assistant

## 2022-11-01 ENCOUNTER — Encounter: Payer: Self-pay | Admitting: Cardiology

## 2022-11-01 NOTE — Telephone Encounter (Signed)
From patient.

## 2022-11-11 ENCOUNTER — Other Ambulatory Visit: Payer: Self-pay | Admitting: Cardiology

## 2022-12-04 ENCOUNTER — Ambulatory Visit: Payer: 59 | Admitting: Cardiology

## 2022-12-04 ENCOUNTER — Encounter: Payer: Self-pay | Admitting: Cardiology

## 2022-12-04 VITALS — BP 139/91 | HR 82 | Resp 16 | Ht 70.0 in | Wt 303.0 lb

## 2022-12-04 DIAGNOSIS — I25118 Atherosclerotic heart disease of native coronary artery with other forms of angina pectoris: Secondary | ICD-10-CM

## 2022-12-04 DIAGNOSIS — I10 Essential (primary) hypertension: Secondary | ICD-10-CM

## 2022-12-04 MED ORDER — TRIAMTERENE-HCTZ 37.5-25 MG PO TABS: 1.0000 | ORAL_TABLET | ORAL | 2 refills | Status: DC

## 2022-12-04 NOTE — Progress Notes (Signed)
Primary Physician/Referring:  Ardith Dark, MD  Patient ID: Michael [name], male    DOB: 1966-04-11, 57 y.o.   MRN: [id number]  Chief Complaint  Patient presents with   Obesity   Hypertension   Follow-up    6 weeks    HPI:    AHREN PETTINGER  is a 57 y.o. Caucasian male with hypertension, hyperlipidemia, morbid obesity and obstructive sleep apnea on CPAP and compliant, CAD presenting with NSTEMI S/P  angioplasty and stenting to the right coronary artery.  He now presents here for management of hypertension and specifically obesity.  No chest pain, no dyspnea.    He is tolerating Ozempic, his dose was increased to 2 mg this last week.  States that about 3 weeks ago he had upper respiratory infection for which she was treated with antibiotics and prednisone, he gained 7 to 8 pounds during that time which she has now lost again.  He has no specific complaint, but has noticed that his blood pressure is elevated since his upper respiratory infection.  Past Medical History:  Diagnosis Date   (HFpEF) heart failure with preserved ejection fraction (HCC)    a. 05/2020 Echo: EF 60%, no rwma, Gr2 DD. Triv MR.   CAD (coronary artery disease)    a. 04/2020 NSTEMI/PCI: LM nl, LAD nl, LCX nl, RCA 36m, 85d (3.6x26 Resolute Onyx DES), RPDA nl, RPAV nl, EF 50-55%.   Hyperlipidemia LDL goal <70 08/28/2018   Obesity hypoventilation syndrome (HCC)    Primary hypertension 08/28/2018   Sleep apnea    Sleep apnea with use of continuous positive airway pressure (CPAP) 08/27/2013   Viral meningitis    Past Surgical History:  Procedure Laterality Date   CARDIAC CATHETERIZATION     CORONARY STENT INTERVENTION N/A 05/20/2020   Procedure: CORONARY STENT INTERVENTION;  Surgeon: Yvonne Kendall, MD;  Location: [facility];  Service: Cardiovascular;  Laterality: N/A;   LEFT HEART CATH AND CORONARY ANGIOGRAPHY N/A 05/20/2020   Procedure: LEFT HEART CATH AND CORONARY ANGIOGRAPHY;  Surgeon: Yvonne Kendall,  MD;  Location: [facility];  Service: Cardiovascular;  Laterality: N/A;   Family History  Problem Relation Age of Onset   Alzheimer's disease Mother     Social History   Tobacco Use   Smoking status: Never   Smokeless tobacco: Never  Substance Use Topics   Alcohol use: Yes    Comment: rarely   Marital Status: Divorced  ROS  Review of Systems  Cardiovascular:  Negative for chest pain, dyspnea on exertion and leg swelling.  Respiratory:  Positive for snoring (on CPAP).   Musculoskeletal:  Negative for gout.   Objective  Blood pressure (!) 139/91, pulse 82, resp. rate 16, height 5\' 10"  (1.778 m), weight (!) 303 lb (137.4 kg), SpO2 95 %.     12/04/2022    2:50 PM 10/31/2022   11:44 AM 10/31/2022   11:25 AM  Vitals with BMI  Height 5\' 10"   5\' 10"   Weight 303 lbs  314 lbs 8 oz  BMI 43.48  45.13  Systolic 139 150 11/02/2022  Diastolic 91 100 90  Pulse 82  65     Physical Exam Constitutional:      Comments: Morbidly obese in no acute distress.  Neck:     Vascular: No carotid bruit or JVD.  Cardiovascular:     Rate and Rhythm: Normal rate and regular rhythm.     Pulses: Normal pulses and intact distal pulses.  Heart sounds: Normal heart sounds. No murmur heard.    No gallop.  Pulmonary:     Effort: Pulmonary effort is normal.     Breath sounds: Normal breath sounds.  Abdominal:     General: Bowel sounds are normal.     Palpations: Abdomen is soft.     Comments: Obese. Pannus present  Musculoskeletal:     Right lower leg: No edema.     Left lower leg: No edema.    Laboratory examination:   Lab Results  Component Value Date   NA 140 10/05/2022   K 4.2 10/05/2022   CO2 26 10/05/2022   GLUCOSE 74 10/05/2022   BUN 14 10/05/2022   CREATININE 0.90 10/05/2022   CALCIUM 9.5 10/05/2022   GFRNONAA >60 05/21/2020       Latest Ref Rng & Units 10/05/2022    1:28 PM 01/25/2021    8:45 AM 05/21/2020    7:01 AM  CMP  Glucose 70 - 99 mg/dL 74  81  660   BUN 6 - 23  mg/dL 14  22  9    Creatinine 0.40 - 1.50 mg/dL  6.30  1.60   Sodium 135 - 145 mEq/L 140  140  138   Potassium 3.5 - 5.1 mEq/L 4.2  4.4  3.8   Chloride 96 - 112 mEq/L 105  104  106   CO2 19 - 32 mEq/L 26  27  22    Calcium 8.4 - 10.5 mg/dL 9.5  9.3  9.0   Total Protein 6.0 - 8.3 g/dL 7.1  6.9    Total Bilirubin 0.2 - 1.2 mg/dL 0.9  0.7    Alkaline Phos 39 - 117 U/L 51  46    AST 0 - 37 U/L 23  20    ALT 0 - 53 U/L 31  23        Latest Ref Rng & Units 10/05/2022    1:28 PM 01/25/2021    8:45 AM 05/21/2020    7:01 AM  CBC  WBC 4.0 - 10.5 K/uL 6.3  7.4  8.9   Hemoglobin 13.0 - 17.0 g/dL 03/27/2021  07/22/2020  32.3   Hematocrit 39.0 - 52.0 % 45.7  48.8  44.4   Platelets 150.0 - 400.0 K/uL 167.0  150.0  175    Lipid Panel Recent Labs    10/05/22 1328  CHOL 100  TRIG 110.0  LDLCALC 45  VLDL 22.0  HDL 32.60*  CHOLHDL 3     HEMOGLOBIN A1C Lab Results  Component Value Date   HGBA1C 5.2 10/05/2022   MPG 105.41 05/19/2020   TSH Recent Labs    10/05/22 1328  TSH 1.95    Medications and allergies  No Known Allergies   Current Outpatient Medications:    acitretin (SORIATANE) 25 MG capsule, Take 25 mg by mouth daily before breakfast., Disp: , Rfl:    albuterol (VENTOLIN HFA) 108 (90 Base) MCG/ACT inhaler, Inhale 1-2 puffs into the lungs every 4 (four) hours as needed., Disp: , Rfl:    ASPIRIN LOW DOSE 81 MG tablet, TAKE 1 TABLET BY MOUTH EVERY DAY, Disp: 30 tablet, Rfl: 8   atorvastatin (LIPITOR) 80 MG tablet, TAKE 1 TABLET BY MOUTH EVERYDAY AT BEDTIME, Disp: 30 tablet, Rfl: 8   bumetanide (BUMEX) 0.5 MG tablet, TAKE 1 TABLET BY MOUTH EVERY DAY, Disp: 30 tablet, Rfl: 5   gabapentin (NEURONTIN) 300 MG capsule, Take 1 capsule (300 mg total) by mouth at bedtime., Disp: 90  capsule, Rfl: 3   MAGNESIUM PO, Take by mouth., Disp: , Rfl:    metoprolol tartrate (LOPRESSOR) 25 MG tablet, TAKE 1 TABLET BY MOUTH TWICE A DAY, Disp: 180 tablet, Rfl: 1   Multiple Vitamin (MULTIVITAMIN) capsule,  Take 1 capsule by mouth daily., Disp: , Rfl:    nitroGLYCERIN (NITROSTAT) 0.4 MG SL tablet, Place 1 tablet (0.4 mg total) under the tongue every 5 (five) minutes x 3 doses as needed for chest pain., Disp: 25 tablet, Rfl: 3   Semaglutide, 2 MG/DOSE, (OZEMPIC, 2 MG/DOSE,) 8 MG/3ML SOPN, Inject 2 mg into the skin once a week., Disp: 9 mL, Rfl: 1   sildenafil (VIAGRA) 100 MG tablet, Take 1 tablet (100 mg total) by mouth daily as needed for erectile dysfunction (Erectile dysfunction)., Disp: 30 tablet, Rfl: 1   testosterone cypionate (DEPOTESTOSTERONE CYPIONATE) 200 MG/ML injection, Inject 1 mL (200 mg total) into the muscle every 14 (fourteen) days., Disp: 7 mL, Rfl: 1   triamterene-hydrochlorothiazide (MAXZIDE-25) 37.5-25 MG tablet, Take 1 tablet by mouth every morning., Disp: 30 tablet, Rfl: 2   valsartan (DIOVAN) 160 MG tablet, TAKE 1 TABLET BY MOUTH EVERY DAY, Disp: 90 tablet, Rfl: 3   Meds ordered this encounter  Medications   triamterene-hydrochlorothiazide (MAXZIDE-25) 37.5-25 MG tablet    Sig: Take 1 tablet by mouth every morning.    Dispense:  30 tablet    Refill:  2   Medications Discontinued During This Encounter  Medication Reason   amoxicillin-clavulanate (AUGMENTIN) 875-125 MG tablet Completed Course   Semaglutide, 1 MG/DOSE, (OZEMPIC, 1 MG/DOSE,) 4 MG/3ML SOPN Dose change   predniSONE (DELTASONE) 20 MG tablet Completed Course     Radiology:   No results found.  Cardiac Studies:   Coronary angiogram 05/20/2020 Severe single-vessel coronary artery disease with sequential 40-50% mid and 80-90% distal RCA stenoses.  The distal RCA lesion appears ulcerated and is likely the culprit lesion for the patient's NSTEMI. No angiographically significant coronary artery disease involving the left coronary artery. Low normal left ventricular systolic function with basal and mid inferior hypokinesis.  Left ventricular filling pressure is moderately elevated (LVEDP ~30 mmHg). Successful PCI  to the distal RCA using Resolute Onyx 3.5 x 26 mm drug-eluting stent (postdilated to 4.1 mm) with 0% residual stenosis and TIMI-3 flow.  RCA Resolute Onyx 3.5 x 26 mm drug-eluting stent  Echocardiogram 05/21/2020:  1. Left ventricular ejection fraction, by estimation, is 60%. The left  ventricle has normal function. The left ventricle has no regional wall  motion abnormalities. There is moderate left ventricular hypertrophy. Left  ventricular diastolic parameters are consistent with Grade II diastolic dysfunction (pseudonormalization).   2. Right ventricular systolic function is normal. The right ventricular  size is normal. Tricuspid regurgitation signal is inadequate for assessing  PA pressure.   3. The mitral valve is grossly normal. Trivial mitral valve  regurgitation. No evidence of mitral stenosis.   4. The aortic valve is tricuspid. Aortic valve regurgitation is not visualized. No aortic stenosis is present.   5. The inferior vena cava is dilated in size with >50% respiratory  variability, suggesting right atrial pressure of 8 mmHg.  EKG  EKG 07/31/2021: Normal sinus rhythm with rate of 76 bpm, normal axis.  No evidence of ischemia, normal EKG. No significant change from EKG 01/09/2021   Assessment     ICD-10-CM   1. Class 3 severe obesity due to excess calories with serious comorbidity and body mass index (BMI) of 50.0 to 59.9 in  adult ([place])  E66.01    Z68.43     2. Primary hypertension  I10 triamterene-hydrochlorothiazide (MAXZIDE-25) 37.5-25 MG tablet    3. Coronary artery disease of native artery of native heart with stable angina pectoris ([place])  I25.118      Meds ordered this encounter  Medications   triamterene-hydrochlorothiazide (MAXZIDE-25) 37.5-25 MG tablet    Sig: Take 1 tablet by mouth every morning.    Dispense:  30 tablet    Refill:  2    Medications Discontinued During This Encounter  Medication Reason   amoxicillin-clavulanate (AUGMENTIN) 875-125 MG tablet  Completed Course   Semaglutide, 1 MG/DOSE, (OZEMPIC, 1 MG/DOSE,) 4 MG/3ML SOPN Dose change   predniSONE (DELTASONE) 20 MG tablet Completed Course   No orders of the defined types were placed in this encounter.    Ozempic.  He is tolerating. Specifically obesity Recommendations:   Michael [name]  is a 57 y.o. Caucasian male with hypertension, hyperlipidemia, morbid obesity and obstructive sleep apnea on CPAP and compliant, CAD presenting with NSTEMI S/P  angioplasty and stenting to the right coronary artery.  He now presents here for management of hypertension and specifically obesity.  1. Class 3 severe obesity due to excess calories with serious comorbidity and body mass index (BMI) of 50.0 to 59.9 in adult [facility]) She is now on 2 mg of Ozempic and overall he has lost about 25  pounds in weight but has become stationary, dose was just increased last week.  Again counseling was performed with regard to slow eating and also taking break halfway during his meal to increase satiety and decrease calorie intake.  Would consider changing to [facility] if there is no response to higher dose of Ozempic.  2. Primary hypertension Blood pressure has been elevated recently even at home, I have added Maxide 37.5/25 mg 1 tablet in the morning daily.  3. Coronary artery disease of native artery of native heart with stable angina pectoris [facility]) He had questions regarding his coronary angiogram, I have reviewed the coronary angiogram again with the patient and updated him the results.  Fortunately he does not have any significant disease in the left main or LAD or CX territory but only had lesions in the right coronary artery.  He has not had any recent angina pectoris.  I will see him back in 6 weeks for management of hypertension and obesity again.   Adrian Prows, MD, [facility] 12/04/2022, 4:41 PM Office: [phone] Fax: [phone] Pager: [phone]

## 2022-12-10 ENCOUNTER — Encounter: Payer: Self-pay | Admitting: Cardiology

## 2022-12-10 NOTE — Telephone Encounter (Signed)
From patient

## 2022-12-29 ENCOUNTER — Other Ambulatory Visit: Payer: Self-pay | Admitting: Cardiology

## 2022-12-29 DIAGNOSIS — N521 Erectile dysfunction due to diseases classified elsewhere: Secondary | ICD-10-CM

## 2023-01-08 ENCOUNTER — Ambulatory Visit: Payer: 59 | Admitting: Cardiology

## 2023-01-08 ENCOUNTER — Encounter: Payer: Self-pay | Admitting: Cardiology

## 2023-01-08 VITALS — BP 111/80 | HR 80 | Ht 69.5 in | Wt 295.8 lb

## 2023-01-08 DIAGNOSIS — I25118 Atherosclerotic heart disease of native coronary artery with other forms of angina pectoris: Secondary | ICD-10-CM

## 2023-01-08 DIAGNOSIS — I10 Essential (primary) hypertension: Secondary | ICD-10-CM

## 2023-01-08 MED ORDER — OZEMPIC (2 MG/DOSE) 8 MG/3ML ~~LOC~~ SOPN: 2.0000 mg | PEN_INJECTOR | SUBCUTANEOUS | 1 refills | Status: DC

## 2023-01-08 MED ORDER — BUMETANIDE 0.5 MG PO TABS: 0.5000 mg | ORAL_TABLET | Freq: Every day | ORAL | 5 refills | Status: DC | PRN

## 2023-01-08 NOTE — Progress Notes (Unsigned)
Primary Physician/Referring:  Vivi Barrack, MD  Patient ID: Michael [name], male    DOB: 08-Aug-1966, 57 y.o.   MRN: [id number]  Chief Complaint  Patient presents with   Class 3 severe obesity due to excess calories with serious    Coronary artery disease of native artery of native heart wi   Follow-up    HPI:    Michael [name]  is a 57 y.o. Caucasian male with hypertension, hyperlipidemia, morbid obesity and obstructive sleep apnea on CPAP and compliant, CAD presenting with NSTEMI S/P  angioplasty and stenting to the right coronary artery.  He now presents here for management of hypertension and specifically obesity.  No chest pain, no dyspnea.    He is tolerating Ozempic, his dose was increased to 2 mg this last week. He has no specific complaint, on his last office visit due to elevated blood pressure had started him on Maxide.  States that his blood pressure is now well-controlled and has had episodes of dizziness due to low blood pressure.  Past Medical History:  Diagnosis Date   (HFpEF) heart failure with preserved ejection fraction ([place])    a. 05/2020 Echo: EF 60%, no rwma, Gr2 DD. Triv MR.   CAD (coronary artery disease)    a. 04/2020 NSTEMI/PCI: LM nl, LAD nl, LCX nl, RCA 40m 85d (3.6x26 Resolute Onyx DES), RPDA nl, RPAV nl, EF 50-55%.   Hyperlipidemia LDL goal <70 08/28/2018   Obesity hypoventilation syndrome ([place]    Primary hypertension 08/28/2018   Sleep apnea    Sleep apnea with use of continuous positive airway pressure (CPAP) 08/27/2013   Viral meningitis    Past Surgical History:  Procedure Laterality Date   CARDIAC CATHETERIZATION     CORONARY STENT INTERVENTION N/A 05/20/2020   Procedure: CORONARY STENT INTERVENTION;  Surgeon: ENelva Bush MD;  [facility];  Service: Cardiovascular;  Laterality: N/A;   LEFT HEART CATH AND CORONARY ANGIOGRAPHY N/A 05/20/2020   Procedure: LEFT HEART CATH AND CORONARY ANGIOGRAPHY;  Surgeon: ENelva Bush MD;   [facility];  Service: Cardiovascular;  Laterality: N/A;   Family History  Problem Relation Age of Onset   Alzheimer's disease Mother     Social History   Tobacco Use   Smoking status: Never   Smokeless tobacco: Never  Substance Use Topics   Alcohol use: Yes    Comment: rarely   Marital Status: Divorced  ROS  Review of Systems  Cardiovascular:  Negative for chest pain, dyspnea on exertion and leg swelling.  Respiratory:  Positive for snoring (on CPAP).   Musculoskeletal:  Negative for gout.  Neurological:  Positive for dizziness (Occasional when he stands up).   Objective  Blood pressure 111/80, pulse 80, height 5' 9.5" (1.765 m), weight 295 lb 12.8 oz (134.2 kg), SpO2 96 %.     01/08/2023    2:56 PM 12/04/2022    2:50 PM 10/31/2022   11:44 AM  Vitals with BMI  Height 5' 9.5" 5' 10"$    Weight 295 lbs 13 oz 303 lbs   BMI [id number]  Systolic [id number] Diastolic 80 91 [id number] Pulse 80 82      Physical Exam Constitutional:      Comments: Morbidly obese in no acute distress.  Neck:     Vascular: No carotid bruit or JVD.  Cardiovascular:     Rate and Rhythm: Normal rate and regular rhythm.     Pulses:  Normal pulses and intact distal pulses.     Heart sounds: Normal heart sounds. No murmur heard.    No gallop.  Pulmonary:     Effort: Pulmonary effort is normal.     Breath sounds: Normal breath sounds.  Abdominal:     General: Bowel sounds are normal.     Palpations: Abdomen is soft.     Comments: Obese. Pannus present  Musculoskeletal:     Right lower leg: No edema.     Left lower leg: No edema.    Laboratory examination:   Lab Results  Component Value Date   NA 140 10/05/2022   K 4.2 10/05/2022   CO2 26 10/05/2022   GLUCOSE 74 10/05/2022   BUN 14 10/05/2022   CREATININE 0.90 10/05/2022   CALCIUM 9.5 10/05/2022   GFRNONAA >60 05/21/2020       Latest Ref Rng & Units 10/05/2022    1:28 PM 01/25/2021    8:45 AM 05/21/2020    7:01 AM   CMP  Glucose 70 - 99 mg/dL 74  81  103   BUN 6 - 23 mg/dL 14  22  9   $ Creatinine 0.40 - 1.50 mg/dL 0.90  0.99  0.94   Sodium 135 - 145 mEq/L 140  140  138   Potassium 3.5 - 5.1 mEq/L 4.2  4.4  3.8   Chloride 96 - 112 mEq/L 105  104  106   CO2 19 - 32 mEq/L 26  27  22   $ Calcium 8.4 - 10.5 mg/dL 9.5  9.3  9.0   Total Protein 6.0 - 8.3 g/dL 7.1  6.9    Total Bilirubin 0.2 - 1.2 mg/dL 0.9  0.7    Alkaline Phos 39 - 117 U/L 51  46    AST 0 - 37 U/L 23  20    ALT 0 - 53 U/L 31  23        Latest Ref Rng & Units 10/05/2022    1:28 PM 01/25/2021    8:45 AM 05/21/2020    7:01 AM  CBC  WBC 4.0 - 10.5 K/uL 6.3  7.4  8.9   Hemoglobin 13.0 - 17.0 g/dL 15.4  16.0  14.5   Hematocrit 39.0 - 52.0 % 45.7  48.8  44.4   Platelets 150.0 - 400.0 K/uL 167.0  150.0  175    Lipid Panel Recent Labs    10/05/22 1328  CHOL 100  TRIG 110.0  LDLCALC 45  VLDL 22.0  HDL 32.60*  CHOLHDL 3   HEMOGLOBIN A1C Lab Results  Component Value Date   HGBA1C 5.2 10/05/2022   MPG 105.41 05/19/2020   TSH Recent Labs    10/05/22 1328  TSH 1.95    Medications and allergies  No Known Allergies   Current Outpatient Medications:    acitretin (SORIATANE) 25 MG capsule, Take 25 mg by mouth daily before breakfast., Disp: , Rfl:    albuterol (VENTOLIN HFA) 108 (90 Base) MCG/ACT inhaler, Inhale 1-2 puffs into the lungs every 4 (four) hours as needed., Disp: , Rfl:    ASPIRIN LOW DOSE 81 MG tablet, TAKE 1 TABLET BY MOUTH EVERY DAY, Disp: 30 tablet, Rfl: 8   atorvastatin (LIPITOR) 80 MG tablet, TAKE 1 TABLET BY MOUTH EVERYDAY AT BEDTIME, Disp: 30 tablet, Rfl: 8   gabapentin (NEURONTIN) 300 MG capsule, Take 1 capsule (300 mg total) by mouth at bedtime., Disp: 90 capsule, Rfl: 3   MAGNESIUM PO, Take by mouth., Disp: ,  Rfl:    metoprolol tartrate (LOPRESSOR) 25 MG tablet, TAKE 1 TABLET BY MOUTH TWICE A DAY, Disp: 180 tablet, Rfl: 1   Multiple Vitamin (MULTIVITAMIN) capsule, Take 1 capsule by mouth daily., Disp: , Rfl:     nitroGLYCERIN (NITROSTAT) 0.4 MG SL tablet, Place 1 tablet (0.4 mg total) under the tongue every 5 (five) minutes x 3 doses as needed for chest pain., Disp: 25 tablet, Rfl: 3   sildenafil (VIAGRA) 100 MG tablet, TAKE 1 TABLET(100 MG) BY MOUTH DAILY AS NEEDED FOR ERECTILE DYSFUNCTION, Disp: 30 tablet, Rfl: 1   testosterone cypionate (DEPOTESTOSTERONE CYPIONATE) 200 MG/ML injection, Inject 1 mL (200 mg total) into the muscle every 14 (fourteen) days., Disp: 7 mL, Rfl: 1   triamterene-hydrochlorothiazide (MAXZIDE-25) 37.5-25 MG tablet, Take 1 tablet by mouth every morning. (Patient taking differently: Take 1 tablet by mouth every other day.), Disp: 30 tablet, Rfl: 2   valsartan (DIOVAN) 160 MG tablet, TAKE 1 TABLET BY MOUTH EVERY DAY, Disp: 90 tablet, Rfl: 3   bumetanide (BUMEX) 0.5 MG tablet, Take 1 tablet (0.5 mg total) by mouth daily as needed (Fluid, weight gain)., Disp: 30 tablet, Rfl: 5   Semaglutide, 2 MG/DOSE, (OZEMPIC, 2 MG/DOSE,) 8 MG/3ML SOPN, Inject 2 mg into the skin once a week., Disp: 9 mL, Rfl: 1   Meds ordered this encounter  Medications   Semaglutide, 2 MG/DOSE, (OZEMPIC, 2 MG/DOSE,) 8 MG/3ML SOPN    Sig: Inject 2 mg into the skin once a week.    Dispense:  9 mL    Refill:  1   bumetanide (BUMEX) 0.5 MG tablet    Sig: Take 1 tablet (0.5 mg total) by mouth daily as needed (Fluid, weight gain).    Dispense:  30 tablet    Refill:  5   Medications Discontinued During This Encounter  Medication Reason   Semaglutide, 2 MG/DOSE, (OZEMPIC, 2 MG/DOSE,) 8 MG/3ML SOPN Dose change   bumetanide (BUMEX) 0.5 MG tablet Reorder      Radiology:   No results found.  Cardiac Studies:   Coronary angiogram 05/20/2020 Severe single-vessel coronary artery disease with sequential 40-50% mid and 80-90% distal RCA stenoses.  The distal RCA lesion appears ulcerated and is likely the culprit lesion for the patient's NSTEMI. No angiographically significant coronary artery disease involving the  left coronary artery. Low normal left ventricular systolic function with basal and mid inferior hypokinesis.  Left ventricular filling pressure is moderately elevated (LVEDP ~30 mmHg). Successful PCI to the distal RCA using Resolute Onyx 3.5 x 26 mm drug-eluting stent (postdilated to 4.1 mm) with 0% residual stenosis and TIMI-3 flow.  RCA Resolute Onyx 3.5 x 26 mm drug-eluting stent  Echocardiogram 05/21/2020:  1. Left ventricular ejection fraction, by estimation, is 60%. The left  ventricle has normal function. The left ventricle has no regional wall  motion abnormalities. There is moderate left ventricular hypertrophy. Left  ventricular diastolic parameters are consistent with Grade II diastolic dysfunction (pseudonormalization).   2. Right ventricular systolic function is normal. The right ventricular  size is normal. Tricuspid regurgitation signal is inadequate for assessing  PA pressure.   3. The mitral valve is grossly normal. Trivial mitral valve  regurgitation. No evidence of mitral stenosis.   4. The aortic valve is tricuspid. Aortic valve regurgitation is not visualized. No aortic stenosis is present.   5. The inferior vena cava is dilated in size with >50% respiratory  variability, suggesting right atrial pressure of 8 mmHg.  EKG  EKG  07/31/2021: Normal sinus rhythm with rate of 76 bpm, normal axis.  No evidence of ischemia, normal EKG. No significant change from EKG 01/09/2021   Assessment     ICD-10-CM   1. Coronary artery disease of native artery of native heart with stable angina pectoris ([place])  I25.118 Amb Ref to Medical Weight Management    Semaglutide, 2 MG/DOSE, (OZEMPIC, 2 MG/DOSE,) 8 MG/3ML SOPN    CANCELED: EKG 12-Lead    2. Class 3 severe obesity due to excess calories with serious comorbidity and body mass index (BMI) of 50.0 to 59.9 in adult (HCC)  E66.01 Amb Ref to Medical Weight Management   Z68.43 Semaglutide, 2 MG/DOSE, (OZEMPIC, 2 MG/DOSE,) 8 MG/3ML SOPN    3.  Primary hypertension  I10 bumetanide (BUMEX) 0.5 MG tablet     Meds ordered this encounter  Medications   Semaglutide, 2 MG/DOSE, (OZEMPIC, 2 MG/DOSE,) 8 MG/3ML SOPN    Sig: Inject 2 mg into the skin once a week.    Dispense:  9 mL    Refill:  1   bumetanide (BUMEX) 0.5 MG tablet    Sig: Take 1 tablet (0.5 mg total) by mouth daily as needed (Fluid, weight gain).    Dispense:  30 tablet    Refill:  5    Medications Discontinued During This Encounter  Medication Reason   Semaglutide, 2 MG/DOSE, (OZEMPIC, 2 MG/DOSE,) 8 MG/3ML SOPN Dose change   bumetanide (BUMEX) 0.5 MG tablet Reorder    Orders Placed This Encounter  Procedures   Amb Ref to Medical Weight Management    Referral Priority:   Routine    Referral Type:   Consultation    Referred to Provider:   Georgianne Fick, FNP    Number of Visits Requested:   1     Ozempic.  He is tolerating. Specifically obesity Recommendations:   Michael [name]  is a 57 y.o. Caucasian male with hypertension, hyperlipidemia, morbid obesity and obstructive sleep apnea on CPAP and compliant, CAD presenting with NSTEMI S/P  angioplasty and stenting to the right coronary artery.  He now presents here for management of hypertension and specifically obesity.  1. Class 3 severe obesity due to excess calories with serious comorbidity and body mass index (BMI) of 50.0 to 59.9 in adult [facility]) She is now on 2 mg of Ozempic and overall he has lost about 32  pounds in weight..  Again counseling was performed with regard to slow eating and also taking break halfway during his meal to increase satiety and decrease calorie intake.  Would consider changing to [facility] if there is no response to higher dose of Ozempic, he was just escalated to 2 mg dose last week and patient thinks it is working. I will refer him for medical weight management..  2. Primary hypertension Blood pressure is not well-controlled and in fact has been low with addition of Maxide 37.5/25 mg  1 tablet in the morning daily.  I will discontinue bumetanide and advised him to use it only if needed if she feels like he has fluid retention.  Hopefully this will reduce his low blood pressure and occasional episodes of dizziness.  Suspect weight loss is contributing to this improved blood pressure control.  3. Coronary artery disease of native artery of native heart with stable angina pectoris [facility]) He is presently doing well, has not had any recent angina or use sublingual nitroglycerin.  Continue aggressive secondary prevention.  Lipids well-controlled.  I would like to  see him back in 6 weeks for follow-up.   Adrian Prows, MD, [facility] 01/08/2023, 3:14 PM Office: [phone] Fax: [phone] Pager: [phone]

## 2023-01-14 ENCOUNTER — Encounter (INDEPENDENT_AMBULATORY_CARE_PROVIDER_SITE_OTHER): Payer: BC Managed Care – PPO | Admitting: Family Medicine

## 2023-01-14 DIAGNOSIS — Z0289 Encounter for other administrative examinations: Secondary | ICD-10-CM

## 2023-01-30 ENCOUNTER — Ambulatory Visit (INDEPENDENT_AMBULATORY_CARE_PROVIDER_SITE_OTHER): Payer: Self-pay | Admitting: Family Medicine

## 2023-02-02 ENCOUNTER — Other Ambulatory Visit: Payer: Self-pay | Admitting: Cardiology

## 2023-02-02 DIAGNOSIS — I10 Essential (primary) hypertension: Secondary | ICD-10-CM

## 2023-02-11 ENCOUNTER — Encounter: Payer: Self-pay | Admitting: Neurology

## 2023-02-11 ENCOUNTER — Encounter: Payer: Self-pay | Admitting: Cardiology

## 2023-02-11 ENCOUNTER — Ambulatory Visit: Payer: 59 | Admitting: Cardiology

## 2023-02-11 VITALS — BP 120/76 | HR 73 | Ht 69.5 in | Wt 295.0 lb

## 2023-02-11 DIAGNOSIS — I25118 Atherosclerotic heart disease of native coronary artery with other forms of angina pectoris: Secondary | ICD-10-CM

## 2023-02-11 DIAGNOSIS — I10 Essential (primary) hypertension: Secondary | ICD-10-CM

## 2023-02-11 DIAGNOSIS — Z6841 Body Mass Index (BMI) 40.0 and over, adult: Secondary | ICD-10-CM

## 2023-02-11 NOTE — Progress Notes (Unsigned)
Primary Physician/Referring:  Vivi Barrack, MD  Patient ID: Mertha Finders, male    DOB: [birth year], 57 y.o.   MRN: [id number]  Chief Complaint  Patient presents with   Coronary Artery Disease   Follow-up    HPI:    BRIAM KNUCKLES  is a 57 y.o. Caucasian male with hypertension, hyperlipidemia, morbid obesity and obstructive sleep apnea on CPAP and compliant, CAD presenting with NSTEMI S/P  angioplasty and stenting to the right coronary artery.  He now presents here for management of hypertension and specifically obesity.  No chest pain, no dyspnea.    He is tolerating Ozempic. He has no specific complaint.  Past Medical History:  Diagnosis Date   (HFpEF) heart failure with preserved ejection fraction ([place])    a. 05/2020 Echo: EF 60%, no rwma, Gr2 DD. Triv MR.   CAD (coronary artery disease)    a. 04/2020 NSTEMI/PCI: LM nl, LAD nl, LCX nl, RCA 95m, 85d (3.6x26 Resolute Onyx DES), RPDA nl, RPAV nl, EF 50-55%.   Hyperlipidemia LDL goal <70 08/28/2018   Obesity hypoventilation syndrome ([place])    Primary hypertension 08/28/2018   Sleep apnea    Sleep apnea with use of continuous positive airway pressure (CPAP) 08/27/2013   Viral meningitis    Past Surgical History:  Procedure Laterality Date   CARDIAC CATHETERIZATION     CORONARY STENT INTERVENTION N/A 05/20/2020   Procedure: CORONARY STENT INTERVENTION;  Surgeon: Nelva Bush, MD;  Location: [facility];  Service: Cardiovascular;  Laterality: N/A;   LEFT HEART CATH AND CORONARY ANGIOGRAPHY N/A 05/20/2020   Procedure: LEFT HEART CATH AND CORONARY ANGIOGRAPHY;  Surgeon: Nelva Bush, MD;  Location: [facility];  Service: Cardiovascular;  Laterality: N/A;   Family History  Problem Relation Age of Onset   Alzheimer's disease Mother     Social History   Tobacco Use   Smoking status: Never   Smokeless tobacco: Never  Substance Use Topics   Alcohol use: Yes    Comment: rarely   Marital Status: Divorced  ROS   Review of Systems  Cardiovascular:  Negative for chest pain, dyspnea on exertion and leg swelling.  Respiratory:  Positive for snoring (on CPAP).   Musculoskeletal:  Negative for gout.  Neurological:  Negative for dizziness.   Objective  Blood pressure 120/76, pulse 73, height 5' 9.5" (1.765 m), weight 295 lb (133.8 kg), SpO2 96 %.     02/11/2023    2:45 PM 01/08/2023    2:56 PM 12/04/2022    2:50 PM  Vitals with BMI  Height 5' 9.5" 5' 9.5" 5\' 10"   Weight 295 lbs 295 lbs 13 oz 303 lbs  BMI 42.95 [id number]  Systolic 123456 [id number]  Diastolic 76 80 91  Pulse 73 80 82     Physical Exam Constitutional:      Comments: Morbidly obese in no acute distress.  Neck:     Vascular: No carotid bruit or JVD.  Cardiovascular:     Rate and Rhythm: Normal rate and regular rhythm.     Pulses: Normal pulses and intact distal pulses.     Heart sounds: Normal heart sounds. No murmur heard.    No gallop.  Pulmonary:     Effort: Pulmonary effort is normal.     Breath sounds: Normal breath sounds.  Abdominal:     General: Bowel sounds are normal.     Palpations: Abdomen is soft.     Comments: Obese. Pannus  present  Musculoskeletal:     Right lower leg: No edema.     Left lower leg: No edema.    Laboratory examination:   Lab Results  Component Value Date   NA 140 10/05/2022   K 4.2 10/05/2022   CO2 26 10/05/2022   GLUCOSE 74 10/05/2022   BUN 14 10/05/2022   CREATININE 0.90 10/05/2022   CALCIUM 9.5 10/05/2022   GFRNONAA >60 05/21/2020       Latest Ref Rng & Units 10/05/2022    1:28 PM 01/25/2021    8:45 AM 05/21/2020    7:01 AM  CMP  Glucose 70 - 99 mg/dL 74  81  103   BUN 6 - 23 mg/dL 14  22  9    Creatinine 0.40 - 1.50 mg/dL 0.90  0.99  0.94   Sodium 135 - 145 mEq/L 140  140  138   Potassium 3.5 - 5.1 mEq/L 4.2  4.4  3.8   Chloride 96 - 112 mEq/L 105  104  106   CO2 19 - 32 mEq/L 26  27  22    Calcium 8.4 - 10.5 mg/dL 9.5  9.3  9.0   Total Protein 6.0 - 8.3 g/dL 7.1  6.9     Total Bilirubin 0.2 - 1.2 mg/dL 0.9  0.7    Alkaline Phos 39 - 117 U/L 51  46    AST 0 - 37 U/L 23  20    ALT 0 - 53 U/L 31  23        Latest Ref Rng & Units 10/05/2022    1:28 PM 01/25/2021    8:45 AM 05/21/2020    7:01 AM  CBC  WBC 4.0 - 10.5 K/uL 6.3  7.4  8.9   Hemoglobin 13.0 - 17.0 g/dL 15.4  16.0  14.5   Hematocrit 39.0 - 52.0 % 45.7  48.8  44.4   Platelets 150.0 - 400.0 K/uL 167.0  150.0  175    Lipid Panel Recent Labs    10/05/22 1328  CHOL 100  TRIG 110.0  LDLCALC 45  VLDL 22.0  HDL 32.60*  CHOLHDL 3   HEMOGLOBIN A1C Lab Results  Component Value Date   HGBA1C 5.2 10/05/2022   MPG 105.41 05/19/2020   TSH Recent Labs    10/05/22 1328  TSH 1.95    Medications and allergies  No Known Allergies   Current Outpatient Medications:    acitretin (SORIATANE) 25 MG capsule, Take 25 mg by mouth daily before breakfast., Disp: , Rfl:    ASPIRIN LOW DOSE 81 MG tablet, TAKE 1 TABLET BY MOUTH EVERY DAY, Disp: 30 tablet, Rfl: 8   atorvastatin (LIPITOR) 80 MG tablet, TAKE 1 TABLET BY MOUTH EVERYDAY AT BEDTIME, Disp: 30 tablet, Rfl: 8   Berberine Chloride (BERBERINE HCI PO), Take 1 capsule by mouth daily at 6 (six) AM., Disp: , Rfl:    gabapentin (NEURONTIN) 300 MG capsule, Take 1 capsule (300 mg total) by mouth at bedtime., Disp: 90 capsule, Rfl: 3   Krill Oil 300 MG CAPS, Take by mouth., Disp: , Rfl:    MAGNESIUM PO, Take by mouth., Disp: , Rfl:    metoprolol tartrate (LOPRESSOR) 25 MG tablet, TAKE 1 TABLET BY MOUTH TWICE A DAY, Disp: 180 tablet, Rfl: 1   Multiple Vitamin (MULTIVITAMIN) capsule, Take 1 capsule by mouth daily., Disp: , Rfl:    nitroGLYCERIN (NITROSTAT) 0.4 MG SL tablet, Place 1 tablet (0.4 mg total) under the tongue every 5 (five)  minutes x 3 doses as needed for chest pain., Disp: 25 tablet, Rfl: 3   Semaglutide, 2 MG/DOSE, (OZEMPIC, 2 MG/DOSE,) 8 MG/3ML SOPN, Inject 2 mg into the skin once a week., Disp: 9 mL, Rfl: 1   sildenafil (VIAGRA) 100 MG tablet,  TAKE 1 TABLET(100 MG) BY MOUTH DAILY AS NEEDED FOR ERECTILE DYSFUNCTION, Disp: 30 tablet, Rfl: 1   testosterone cypionate (DEPOTESTOSTERONE CYPIONATE) 200 MG/ML injection, Inject 1 mL (200 mg total) into the muscle every 14 (fourteen) days., Disp: 7 mL, Rfl: 1   triamterene-hydrochlorothiazide (MAXZIDE-25) 37.5-25 MG tablet, Take 1 tablet by mouth every morning. (Patient taking differently: Take 1 tablet by mouth every other day.), Disp: 30 tablet, Rfl: 2   valsartan (DIOVAN) 160 MG tablet, TAKE 1 TABLET BY MOUTH EVERY DAY, Disp: 90 tablet, Rfl: 3   albuterol (VENTOLIN HFA) 108 (90 Base) MCG/ACT inhaler, Inhale 1-2 puffs into the lungs every 4 (four) hours as needed. (Patient not taking: Reported on 02/11/2023), Disp: , Rfl:    bumetanide (BUMEX) 0.5 MG tablet, TAKE 1 TABLET BY MOUTH EVERY DAY (Patient not taking: Reported on 02/11/2023), Disp: 30 tablet, Rfl: 5     Radiology:   No results found.  Cardiac Studies:   Coronary angiogram 05/20/2020 Severe single-vessel coronary artery disease with sequential 40-50% mid and 80-90% distal RCA stenoses.  The distal RCA lesion appears ulcerated and is likely the culprit lesion for the patient's NSTEMI. No angiographically significant coronary artery disease involving the left coronary artery. Low normal left ventricular systolic function with basal and mid inferior hypokinesis.  Left ventricular filling pressure is moderately elevated (LVEDP ~30 mmHg). Successful PCI to the distal RCA using Resolute Onyx 3.5 x 26 mm drug-eluting stent (postdilated to 4.1 mm) with 0% residual stenosis and TIMI-3 flow.  RCA Resolute Onyx 3.5 x 26 mm drug-eluting stent  Echocardiogram 05/21/2020:  1. Left ventricular ejection fraction, by estimation, is 60%. The left  ventricle has normal function. The left ventricle has no regional wall  motion abnormalities. There is moderate left ventricular hypertrophy. Left  ventricular diastolic parameters are consistent with Grade  II diastolic dysfunction (pseudonormalization).   2. Right ventricular systolic function is normal. The right ventricular  size is normal. Tricuspid regurgitation signal is inadequate for assessing  PA pressure.   3. The mitral valve is grossly normal. Trivial mitral valve  regurgitation. No evidence of mitral stenosis.   4. The aortic valve is tricuspid. Aortic valve regurgitation is not visualized. No aortic stenosis is present.   5. The inferior vena cava is dilated in size with >50% respiratory  variability, suggesting right atrial pressure of 8 mmHg.  EKG  EKG 02/11/2023: Normal sinus rhythm at the rate of 69 beats minute, normal EKG.  Compared to 07/31/2021, no change.  Assessment     ICD-10-CM   1. Coronary artery disease of native artery of native heart with stable angina pectoris ([place])  I25.118 EKG 12-Lead    2. Class 3 severe obesity due to excess calories with serious comorbidity and body mass index (BMI) of 50.0 to 59.9 in adult ([place])  E66.01    Z68.43     3. Primary hypertension  I10      No orders of the defined types were placed in this encounter.   There are no discontinued medications.   Orders Placed This Encounter  Procedures   EKG 12-Lead     Ozempic.  He is tolerating. Specifically obesity Recommendations:   KEAUNDRE GALLET  is  a 57 y.o. Caucasian male with hypertension, hyperlipidemia, morbid obesity and obstructive sleep apnea on CPAP and compliant, CAD presenting with NSTEMI S/P  angioplasty and stenting to the right coronary artery.  He now presents here for management of hypertension and specifically obesity.  1. Coronary artery disease of native artery of native heart with stable angina pectoris [facility]) Patient remains stable without recurrence of angina pectoris, I reviewed his angiogram, reassured him that he has no other significant disease except right coronary artery stenosis which was stented.  He is getting married next month.  I wished him all the  bes and congratulations.  Blood pressure is well-controlled, lipids are under excellent control.  His risk factors except for obesity is well-controlled. - EKG 12-Lead  2. Class 3 severe obesity due to excess calories with serious comorbidity and body mass index (BMI) of 50.0 to 59.9 in adult [facility]) Obesity continues to be a major issue, presently tolerating GLP-1 agonist however no significant change in his weight.  He is now enrolled in health and wellness and will have his first visit this coming week.  Suspect behavioral changes will certainly help with weight loss.  3. Primary hypertension Blood pressure under excellent control.  No changes in the medications needed.  I will see him back in 3 months for follow-up.    Adrian Prows, MD, [facility] 02/13/2023, 4:20 AM Office: [phone] Fax: [phone] Pager: [phone]

## 2023-02-12 ENCOUNTER — Ambulatory Visit: Payer: 59 | Admitting: Cardiology

## 2023-02-13 ENCOUNTER — Ambulatory Visit (INDEPENDENT_AMBULATORY_CARE_PROVIDER_SITE_OTHER): Payer: 59 | Admitting: Family Medicine

## 2023-02-13 ENCOUNTER — Encounter (INDEPENDENT_AMBULATORY_CARE_PROVIDER_SITE_OTHER): Payer: Self-pay | Admitting: Family Medicine

## 2023-02-13 VITALS — BP 123/79 | HR 78 | Temp 98.3°F | Ht 68.0 in | Wt 290.0 lb

## 2023-02-13 DIAGNOSIS — E782 Mixed hyperlipidemia: Secondary | ICD-10-CM | POA: Diagnosis not present

## 2023-02-13 DIAGNOSIS — I10 Essential (primary) hypertension: Secondary | ICD-10-CM | POA: Diagnosis not present

## 2023-02-13 DIAGNOSIS — Z6841 Body Mass Index (BMI) 40.0 and over, adult: Secondary | ICD-10-CM

## 2023-02-13 DIAGNOSIS — R0602 Shortness of breath: Secondary | ICD-10-CM

## 2023-02-13 DIAGNOSIS — R5382 Chronic fatigue, unspecified: Secondary | ICD-10-CM

## 2023-02-13 DIAGNOSIS — I25118 Atherosclerotic heart disease of native coronary artery with other forms of angina pectoris: Secondary | ICD-10-CM | POA: Diagnosis not present

## 2023-02-13 DIAGNOSIS — G4733 Obstructive sleep apnea (adult) (pediatric): Secondary | ICD-10-CM

## 2023-02-13 DIAGNOSIS — R7989 Other specified abnormal findings of blood chemistry: Secondary | ICD-10-CM

## 2023-02-13 DIAGNOSIS — Z1331 Encounter for screening for depression: Secondary | ICD-10-CM | POA: Diagnosis not present

## 2023-02-13 NOTE — Progress Notes (Signed)
Chief Complaint:   OBESITY Michael [name] (MR# [id number]) is a 57 y.o. male who presents for evaluation and treatment of obesity and related comorbidities. Current BMI is Body mass index is 44.09 kg/m. Michael [name] has been struggling with his weight for many years and has been unsuccessful in either losing weight, maintaining weight loss, or reaching his healthy weight goal.  New patient that attended information session.  He has had a complex cardiac history that included NSTEMI, PCI and drug eluting stent.  He has hyperlipidemia, OSA and dyshidrotic eczema as well.  He is currently taking Ozempic 2mg  and has been on that dose for about 3 months. 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  Typical hours are 8-5 Monday- Friday.  He is getting married on 02/23/23.  Lives with Michael [name], his fiance, and she is the main cook of the household. Goal weight is 220lbs, last time he was that weight was in the 90s. Has tried many different diets and thinks currently eats around 1300 calories a day. Patient skips breakfast daily.  Tends to crave more in the evenings. Coffee in the am usually black but occassionally with a pistachio latte syrup (rarely).  Makes 2 cans of soup daily that he brings in a thermos and sips on during the day.  Chicken and rice, Tomato basil or chicken noodle are common choices. Will ultimately be able to eat/ drink all of that.  Will bring a skinny pop bag or cheese puff bag.  Feels satisfied. Supper is either more soup of dinner that [employer].  Dinner she may make is salmon patty with asian salad or ground chicken lettuce wraps (feels satisfied).  Has an all or nothing personality.  Michael [name] is currently in the action stage of change and ready to dedicate time achieving and maintaining a healthier weight. Michael [name] is interested in becoming our patient and working on intensive lifestyle modifications including (but not limited to) diet and exercise for  weight loss.  Michael [name]'s habits were reviewed today and are as follows: His family eats meals together, he thinks his family will eat healthier with him, his desired weight loss is 81 LBS, he has been heavy most of his life, he started gaining weight at age 39 and 22 years, his heaviest weight ever was 348 pounds, he has significant food cravings issues, he snacks frequently in the evenings, he skips meals frequently, he is frequently drinking liquids with calories, he frequently makes poor food choices, he has problems with excessive hunger, he frequently eats larger portions than normal, and he struggles with emotional eating.  Depression Screen Michael [name]'s Food and Mood (modified PHQ-9) score was 11.  Subjective:   1. Chronic fatigue Michael [name] admits to daytime somnolence and admits to waking up still tired. Patient has a history of symptoms of daytime fatigue and morning fatigue. [name] generally gets 5 or 6 hours of sleep per night, and states that he has nightime awakenings. Snoring is present. Apneic episodes are present. Epworth Sleepiness Score is 8.  EKG NSR with no change from 07/31/2021, done by Dr. Einar [name].  2. Shortness of breath Michael [name] notes increasing shortness of breath with exercising and seems to be worsening over time with weight gain. He notes getting out of breath sooner with activity than he used to. This has not gotten worse recently. Michael [name] denies shortness of breath at rest or orthopnea.  3. Primary hypertension Blood pressure controlled today.  Patient denies  chest pain, chest pressure, headache.  Patient is on metoprolol, valsartan, triamterene/HCTZ.    4. Mixed hyperlipidemia Patient is on atorvastatin.  Last LDL 45, HDL 32.6, triglycerides 110.  5. Coronary artery disease of native artery of native heart with stable angina pectoris [facility]) Patient had a drug-eluting stent.  Patient sees Dr. Einar [name].  6. OSA (obstructive sleep apnea) Patient has a CPAP, initial increase in sleep quantity  but patient has not been using.  See Dr. Brett [name] but last visit was in 2021.  7. Low testosterone Previously on IM injection.  Positive for fatigue.  Assessment/Plan:   1. Chronic fatigue Michael [name] does feel that his weight is causing his energy to be lower than it should be. Fatigue may be related to obesity, depression or many other causes. Labs will be ordered, and in the meanwhile, Life will focus on self care including making healthy food choices, increasing physical activity and focusing on stress reduction.  Check IC and labs today.  - Vitamin B12 - Folate - Hemoglobin A1c - Insulin, random - VITAMIN D 25 Hydroxy (Vit-D Deficiency, Fractures) - TSH - T4, free - T3  2. Shortness of breath Michael [name] does feel that he gets out of breath more easily that he used to when he exercises. Michael [name]'s shortness of breath appears to be obesity related and exercise induced. He has agreed to work on weight loss and gradually increase exercise to treat his exercise induced shortness of breath. Will continue to monitor closely.  3. Primary hypertension Continue current medications, no change in dose.  Check labs today.  - Comprehensive metabolic panel  4. Mixed hyperlipidemia Continue atorvastatin.  Check labs today.  - Lipid Panel With LDL/HDL Ratio  5. Coronary artery disease of native artery of native heart with stable angina pectoris ([place]) Continue current medications.  Follow-up with Dr. Einar [name] at already scheduled appointment.  6. OSA (obstructive sleep apnea) Follow-up with Dr. Brett [name] for repeat study.  Check labs today.  - CBC with Differential/Platelet  7. Low testosterone Follow-up on symptoms at next appointment.  8. Depression screening Michael [name] had a positive depression screening. Depression is commonly associated with obesity and often results in emotional eating behaviors. We will monitor this closely and work on CBT to help improve the non-hunger eating patterns. Referral to  Psychology may be required if no improvement is seen as he continues in our clinic.  9. BMI 40.0-44.9, adult ([place])  10. Obesity with starting BMI of 44.1 Michael [name] is currently in the action stage of change and his goal is to continue with weight loss efforts. I recommend Michael [name] begin the structured treatment plan as follows:  He has agreed to the Category 3 Plan.  Exercise goals: No exercise has been prescribed at this time.   Behavioral modification strategies: increasing lean protein intake, meal planning and cooking strategies, better snacking choices, and planning for success.  He was informed of the importance of frequent follow-up visits to maximize his success with intensive lifestyle modifications for his multiple health conditions. He was informed we would discuss his lab results at his next visit unless there is a critical issue that needs to be addressed sooner. Michael [name] agreed to keep his next visit at the agreed upon time to discuss these results.  Objective:   Blood pressure 123/79, pulse 78, temperature 98.3 F (36.8 C), height 5\' 8"  (1.727 m), weight 290 lb (131.5 kg), SpO2 97 %. Body mass index is 44.09 kg/m.  EKG: Normal sinus rhythm, rate 69 bpm.  Indirect Calorimeter completed today shows a VO2 of 285 and a REE of 1973.  His calculated basal metabolic rate is 0000000 thus his basal metabolic rate is worse than expected.  General: Cooperative, alert, well developed, in no acute distress. HEENT: Conjunctivae and lids unremarkable. Cardiovascular: Regular rhythm.  Lungs: Normal work of breathing. Neurologic: No focal deficits.   Lab Results  Component Value Date   CREATININE 1.04 02/13/2023   BUN 26 (H) 02/13/2023   NA 144 02/13/2023   K 4.7 02/13/2023   CL 104 02/13/2023   CO2 26 02/13/2023   Lab Results  Component Value Date   ALT 38 02/13/2023   AST 23 02/13/2023   ALKPHOS 57 02/13/2023   BILITOT 0.5 02/13/2023   Lab Results  Component Value Date   HGBA1C 5.3  02/13/2023   HGBA1C 5.2 10/05/2022   HGBA1C 5.5 01/25/2021   HGBA1C 5.3 05/19/2020   Lab Results  Component Value Date   INSULIN 23.0 02/13/2023   Lab Results  Component Value Date   TSH 2.420 02/13/2023   Lab Results  Component Value Date   CHOL 161 02/13/2023   HDL 36 (L) 02/13/2023   LDLCALC 85 02/13/2023   TRIG 240 (H) 02/13/2023   CHOLHDL 3 10/05/2022   Lab Results  Component Value Date   WBC 7.3 02/13/2023   HGB 14.7 02/13/2023   HCT 44.6 02/13/2023   MCV 89 02/13/2023   PLT 166 02/13/2023   No results found for: "IRON", "TIBC", "FERRITIN"  Attestation Statements:   Reviewed by clinician on day of visit: allergies, medications, problem list, medical history, surgical history, family history, social history, and previous encounter notes. This is the patient's first visit at [facility]. The patient's NEW PATIENT PACKET was reviewed at length. Included in the packet: current and past health history, medications, allergies, ROS, gynecologic history (women only), surgical history, family history, social history, weight history, weight loss surgery history (for those that have had weight loss surgery), nutritional evaluation, mood and food questionnaire, PHQ9, Epworth questionnaire, sleep habits questionnaire, patient life and health improvement goals questionnaire. These will all be scanned into the patient's chart under media.   During the visit, I independently reviewed the patient's EKG, bioimpedance scale results, and indirect calorimeter results. I used this information to tailor a meal plan for the patient that will help him to lose weight and will improve his obesity-related conditions going forward. I performed a medically necessary appropriate examination and/or evaluation. I discussed the assessment and treatment plan with the patient. The patient was provided an opportunity to ask questions and all were answered. The patient agreed with the plan and  demonstrated an understanding of the instructions. Labs were ordered at this visit and will be reviewed at the next visit unless more critical results need to be addressed immediately. Clinical information was updated and documented in the EMR.     I, Davy Pique, RMA, am acting as transcriptionist for Coralie Common, MD. I have reviewed the above documentation for accuracy and completeness, and I agree with the above. - Coralie Common, MD

## 2023-02-13 NOTE — Telephone Encounter (Signed)
Called pt.  Scheduled him a office visit with Dr. Brett Fairy on 6/10 @ 3:00 pm. Pt said thank you for calling.

## 2023-02-14 LAB — LIPID PANEL WITH LDL/HDL RATIO
Cholesterol, Total: 161 mg/dL (ref 100–199)
HDL: 36 mg/dL — ABNORMAL LOW (ref >39)
LDL Chol Calc (NIH): 85 mg/dL (ref 0–99)
LDL/HDL Ratio: 2.4 ratio (ref 0.0–3.6)
Triglycerides: 240 mg/dL — ABNORMAL HIGH (ref 0–149)
VLDL Cholesterol Cal: 40 mg/dL (ref 5–40)

## 2023-02-14 LAB — COMPREHENSIVE METABOLIC PANEL
ALT: 38 IU/L (ref 0–44)
AST: 23 IU/L (ref 0–40)
Albumin/Globulin Ratio: 1.6 (ref 1.2–2.2)
Albumin: 4.3 g/dL (ref 3.8–4.9)
Alkaline Phosphatase: 57 IU/L (ref 44–121)
BUN/Creatinine Ratio: 25 — ABNORMAL HIGH (ref 9–20)
BUN: 26 mg/dL — ABNORMAL HIGH (ref 6–24)
Bilirubin Total: 0.5 mg/dL (ref 0.0–1.2)
CO2: 26 mmol/L (ref 20–29)
Calcium: 9.5 mg/dL (ref 8.7–10.2)
Chloride: 104 mmol/L (ref 96–106)
Creatinine, Ser: 1.04 mg/dL (ref 0.76–1.27)
Globulin, Total: 2.7 g/dL (ref 1.5–4.5)
Glucose: 89 mg/dL (ref 70–99)
Potassium: 4.7 mmol/L (ref 3.5–5.2)
Sodium: 144 mmol/L (ref 134–144)
Total Protein: 7 g/dL (ref 6.0–8.5)
eGFR: 84 mL/min/{1.73_m2} (ref >59)

## 2023-02-14 LAB — CBC WITH DIFFERENTIAL/PLATELET
Basophils Absolute: 0.1 10*3/uL (ref 0.0–0.2)
Basos: 1 %
EOS (ABSOLUTE): 0.6 10*3/uL — ABNORMAL HIGH (ref 0.0–0.4)
Eos: 9 %
Hematocrit: 44.6 % (ref 37.5–51.0)
Hemoglobin: 14.7 g/dL (ref 13.0–17.7)
Immature Grans (Abs): 0 10*3/uL (ref 0.0–0.1)
Immature Granulocytes: 0 %
Lymphocytes Absolute: 1.7 10*3/uL (ref 0.7–3.1)
Lymphs: 23 %
MCH: 29.2 pg (ref 26.6–33.0)
MCHC: 33 g/dL (ref 31.5–35.7)
MCV: 89 fL (ref 79–97)
Monocytes Absolute: 0.6 10*3/uL (ref 0.1–0.9)
Monocytes: 8 %
Neutrophils Absolute: 4.3 10*3/uL (ref 1.4–7.0)
Neutrophils: 59 %
Platelets: 166 10*3/uL (ref 150–450)
RBC: 5.03 x10E6/uL (ref 4.14–5.80)
RDW: 13.5 % (ref 11.6–15.4)
WBC: 7.3 10*3/uL (ref 3.4–10.8)

## 2023-02-14 LAB — HEMOGLOBIN A1C
Est. average glucose Bld gHb Est-mCnc: 105 mg/dL
Hgb A1c MFr Bld: 5.3 % (ref 4.8–5.6)

## 2023-02-14 LAB — T3: T3, Total: 106 ng/dL (ref 71–180)

## 2023-02-14 LAB — TSH: TSH: 2.42 u[IU]/mL (ref 0.450–4.500)

## 2023-02-14 LAB — FOLATE: Folate: 10 ng/mL (ref >3.0)

## 2023-02-14 LAB — VITAMIN B12: Vitamin B-12: 789 pg/mL (ref 232–1245)

## 2023-02-14 LAB — INSULIN, RANDOM: INSULIN: 23 u[IU]/mL (ref 2.6–24.9)

## 2023-02-14 LAB — T4, FREE: Free T4: 1.09 ng/dL (ref 0.82–1.77)

## 2023-02-14 LAB — VITAMIN D 25 HYDROXY (VIT D DEFICIENCY, FRACTURES): Vit D, 25-Hydroxy: 29.3 ng/mL — ABNORMAL LOW (ref 30.0–100.0)

## 2023-02-20 ENCOUNTER — Other Ambulatory Visit: Payer: Self-pay | Admitting: Cardiology

## 2023-02-20 DIAGNOSIS — I25118 Atherosclerotic heart disease of native coronary artery with other forms of angina pectoris: Secondary | ICD-10-CM

## 2023-02-27 ENCOUNTER — Ambulatory Visit (INDEPENDENT_AMBULATORY_CARE_PROVIDER_SITE_OTHER): Payer: Self-pay | Admitting: Family Medicine

## 2023-03-02 ENCOUNTER — Other Ambulatory Visit: Payer: Self-pay | Admitting: Cardiology

## 2023-03-02 DIAGNOSIS — I10 Essential (primary) hypertension: Secondary | ICD-10-CM

## 2023-03-19 ENCOUNTER — Ambulatory Visit (INDEPENDENT_AMBULATORY_CARE_PROVIDER_SITE_OTHER): Payer: 59 | Admitting: Family Medicine

## 2023-03-19 ENCOUNTER — Encounter (INDEPENDENT_AMBULATORY_CARE_PROVIDER_SITE_OTHER): Payer: Self-pay | Admitting: Family Medicine

## 2023-03-19 VITALS — BP 128/86 | HR 74 | Temp 97.9°F | Ht 68.0 in | Wt 297.0 lb

## 2023-03-19 DIAGNOSIS — E559 Vitamin D deficiency, unspecified: Secondary | ICD-10-CM | POA: Diagnosis not present

## 2023-03-19 DIAGNOSIS — I10 Essential (primary) hypertension: Secondary | ICD-10-CM | POA: Diagnosis not present

## 2023-03-19 DIAGNOSIS — E88819 Insulin resistance, unspecified: Secondary | ICD-10-CM | POA: Diagnosis not present

## 2023-03-19 DIAGNOSIS — E781 Pure hyperglyceridemia: Secondary | ICD-10-CM | POA: Diagnosis not present

## 2023-03-19 DIAGNOSIS — E669 Obesity, unspecified: Secondary | ICD-10-CM

## 2023-03-19 DIAGNOSIS — Z6841 Body Mass Index (BMI) 40.0 and over, adult: Secondary | ICD-10-CM

## 2023-03-19 MED ORDER — VITAMIN D (ERGOCALCIFEROL) 1.25 MG (50000 UNIT) PO CAPS: 50000.0000 [IU] | ORAL_CAPSULE | ORAL | 0 refills | Status: DC

## 2023-03-19 NOTE — Progress Notes (Unsigned)
Chief Complaint:   OBESITY Michael [name] is here to discuss his progress with his obesity treatment plan along with follow-up of his obesity related diagnoses. Michael [name] is on the Category 3 Plan and states he is following his eating plan approximately 90% of the time. Michael [name] states he is some walking.  Today's visit was #: 2 Starting weight: 290 lbs Starting date: 02/13/2023 Today's weight: 297 lb Today's date: 03/19/2023 Total lbs lost to date: 7 lbs Total lbs lost since last in-office visit: +7 lbs  Interim History: Patient got married since last appointment and went on a mini moon.  He is going on a [place] cruise early June which is actual honeymoon.  He didn't start plan prior to wedding but started the plan around April 13th.  He stuck to plan with exception of a few indulgent meals.  He is able to get all food in but  is eating until he is uncomfortable even at [facility].  He is wondering about total calories and total protein for the day.  He is wondering about what he can do with his snack calories.  He mentions he has noticed an increased time between bowel movements and increased effort for a bowel movement.   Subjective:   1. Essential hypertension Patient blood pressure controlled today.  Patient denies chest pain, chest pressure, headache.  Patient is on Bumex, metoprolol, Maxide and Diovan.    2. Vitamin D deficiency New diagnosis. Discussed labs with patient today. Patient is positive for fatigue.  Patient is not on medication replacement therapy.  3. Insulin resistance New diagnosis. Discussed labs with patient today. Patient last A1c was 5.3, insulin 23.0.  4. Hypertriglyceridemia Worsening.  Discussed labs with patient today. Patient HDL 36, triglycerides 240, LDL 85.  Patient had a previously normal triglycerides.  Assessment/Plan:   1. Essential hypertension Continue current labs, no change in medication or doses.  2. Vitamin D deficiency Start- Vitamin D,  Ergocalciferol, (DRISDOL) 1.25 MG (50000 UNIT) CAPS capsule; Take 1 capsule (50,000 Units total) by mouth every 7 (seven) days.  Dispense: 4 capsule; Refill: 0  3. Insulin resistance Pathophysiology of insulin resistance, prediabetes, diabetes discussed with patient today.  No medications at this time.  4. Hypertriglyceridemia Follow-up FLP in 3 months.  5. BMI 45.0-49.9, adult ([facility])  6. Obesity with starting BMI of 44.1 Michael [name] is currently in the action stage of change. As such, his goal is to continue with weight loss efforts. He has agreed to the Category 3 Plan.   Exercise goals: All adults should avoid inactivity. Some physical activity is better than none, and adults who participate in any amount of physical activity gain some health benefits.  Behavioral modification strategies: increasing lean protein intake, meal planning and cooking strategies, keeping healthy foods in the home, and planning for success.  Maximum has agreed to follow-up with our clinic in 2 weeks. He was informed of the importance of frequent follow-up visits to maximize his success with intensive lifestyle modifications for his multiple health conditions.   Objective:   Blood pressure 128/86, pulse 74, temperature 97.9 F (36.6 C), height 5\' 8"  (1.727 m), weight 297 lb (134.7 kg), SpO2 97 %. Body mass index is 45.16 kg/m.  General: Cooperative, alert, well developed, in no acute distress. HEENT: Conjunctivae and lids unremarkable. Cardiovascular: Regular rhythm.  Lungs: Normal work of breathing. Neurologic: No focal deficits.   Lab Results  Component Value Date   CREATININE 1.04 02/13/2023   BUN 26 (H) 02/13/2023  NA 144 02/13/2023   K 4.7 02/13/2023   CL 104 02/13/2023   CO2 26 02/13/2023   Lab Results  Component Value Date   ALT 38 02/13/2023   AST 23 02/13/2023   ALKPHOS 57 02/13/2023   BILITOT 0.5 02/13/2023   Lab Results  Component Value Date   HGBA1C 5.3 02/13/2023   HGBA1C 5.2  10/05/2022   HGBA1C 5.5 01/25/2021   HGBA1C 5.3 05/19/2020   Lab Results  Component Value Date   INSULIN 23.0 02/13/2023   Lab Results  Component Value Date   TSH 2.420 02/13/2023   Lab Results  Component Value Date   CHOL 161 02/13/2023   HDL 36 (L) 02/13/2023   LDLCALC 85 02/13/2023   TRIG 240 (H) 02/13/2023   CHOLHDL 3 10/05/2022   Lab Results  Component Value Date   VD25OH 29.3 (L) 02/13/2023   Lab Results  Component Value Date   WBC 7.3 02/13/2023   HGB 14.7 02/13/2023   HCT 44.6 02/13/2023   MCV 89 02/13/2023   PLT 166 02/13/2023   No results found for: "IRON", "TIBC", "FERRITIN"  Attestation Statements:   Reviewed by clinician on day of visit: allergies, medications, problem list, medical history, surgical history, family history, social history, and previous encounter notes.  Time spent on visit including pre-visit chart review and post-visit care and charting was 45 minutes.   I, Michael [name], RMA, am acting as transcriptionist for Michael Likes, MD. I have reviewed the above documentation for accuracy and completeness, and I agree with the above. - Michael Likes, MD

## 2023-04-09 ENCOUNTER — Other Ambulatory Visit (INDEPENDENT_AMBULATORY_CARE_PROVIDER_SITE_OTHER): Payer: Self-pay | Admitting: Family Medicine

## 2023-04-09 DIAGNOSIS — E559 Vitamin D deficiency, unspecified: Secondary | ICD-10-CM

## 2023-04-10 ENCOUNTER — Ambulatory Visit (INDEPENDENT_AMBULATORY_CARE_PROVIDER_SITE_OTHER): Payer: 59 | Admitting: Family Medicine

## 2023-04-17 ENCOUNTER — Other Ambulatory Visit (INDEPENDENT_AMBULATORY_CARE_PROVIDER_SITE_OTHER): Payer: Self-pay | Admitting: Family Medicine

## 2023-04-17 DIAGNOSIS — E559 Vitamin D deficiency, unspecified: Secondary | ICD-10-CM

## 2023-04-29 ENCOUNTER — Other Ambulatory Visit: Payer: Self-pay | Admitting: Cardiology

## 2023-04-29 ENCOUNTER — Ambulatory Visit: Payer: 59 | Admitting: Neurology

## 2023-05-06 ENCOUNTER — Ambulatory Visit (INDEPENDENT_AMBULATORY_CARE_PROVIDER_SITE_OTHER): Payer: 59 | Admitting: Family Medicine

## 2023-05-06 ENCOUNTER — Encounter (INDEPENDENT_AMBULATORY_CARE_PROVIDER_SITE_OTHER): Payer: Self-pay | Admitting: Family Medicine

## 2023-05-06 VITALS — BP 108/70 | HR 72 | Temp 98.0°F | Ht 68.0 in | Wt 310.0 lb

## 2023-05-06 DIAGNOSIS — E88819 Insulin resistance, unspecified: Secondary | ICD-10-CM | POA: Diagnosis not present

## 2023-05-06 DIAGNOSIS — Z6841 Body Mass Index (BMI) 40.0 and over, adult: Secondary | ICD-10-CM | POA: Diagnosis not present

## 2023-05-06 DIAGNOSIS — E559 Vitamin D deficiency, unspecified: Secondary | ICD-10-CM

## 2023-05-06 DIAGNOSIS — E669 Obesity, unspecified: Secondary | ICD-10-CM | POA: Diagnosis not present

## 2023-05-06 MED ORDER — VITAMIN D (ERGOCALCIFEROL) 1.25 MG (50000 UNIT) PO CAPS: 50000.0000 [IU] | ORAL_CAPSULE | ORAL | 0 refills | Status: DC

## 2023-05-06 NOTE — Progress Notes (Unsigned)
Chief Complaint:   OBESITY Michael [name] is here to discuss his progress with his obesity treatment plan along with follow-up of his obesity related diagnoses. Michael [name] is on the Category 3 Plan and states he is following his eating plan approximately 0% of the time. Michael [name] states he was walking 5 miles for 4 days on a cruise.    Today's visit was #: 3 Starting weight: 290 lbs Starting date: 02/13/2023 Today's weight: 310 lbs Today's date: 05/06/2023 Total lbs lost to date: 0 Total lbs lost since last in-office visit: 0  Interim History: Patient had a wedding and honeymoon since end of April.  Trip was amazing and he voices he felt it was the most scenic place he has ever been.  He has plans to be home for a bit now that he is back.  He is thinking he may want to try Low Carb plan as he feels like this may be easier for him to stay compliant to in terms of eating and not having to weigh and measure.   Subjective:   1. Vitamin D deficiency Patient is on prescription vitamin D.  He denies nausea, vomiting, or muscle weakness but notes fatigue.  2. Insulin resistance Patient is on Ozempic weekly with no GI side effects noted.  Assessment/Plan:   1. Vitamin D deficiency Patient will continue prescription vitamin D, and we will refill for 1 month.  - Vitamin D, Ergocalciferol, (DRISDOL) 1.25 MG (50000 UNIT) CAPS capsule; Take 1 capsule (50,000 Units total) by mouth every 7 (seven) days.  Dispense: 4 capsule; Refill: 0  2. Insulin resistance Patient will continue Ozempic with no change in dose.  3. BMI 45.0-49.9, adult (HCC)  4. Obesity with starting BMI of 44.1 Lar[place] is currently in the action stage of change. As such, his goal is to continue with weight loss efforts. He has agreed to following a lower carbohydrate, vegetable and lean protein rich diet plan.   Exercise goals: No exercise has been prescribed at this time.  Behavioral modification strategies: increasing lean protein intake,  meal planning and cooking strategies, keeping healthy foods in the home, and planning for success.  [facility] has agreed to follow-up with our clinic in 2 weeks. He was informed of the importance of frequent follow-up visits to maximize his success with intensive lifestyle modifications for his multiple health conditions.   Objective:   Blood pressure 108/70, pulse 72, temperature 98 F (36.7 C), height 5\' 8"  (1.727 m), weight (!) 310 lb (140.6 kg), SpO2 98 %. Body mass index is 47.14 kg/m.  General: Cooperative, alert, well developed, in no acute distress. HEENT: Conjunctivae and lids unremarkable. Cardiovascular: Regular rhythm.  Lungs: Normal work of breathing. Neurologic: No focal deficits.   Lab Results  Component Value Date   CREATININE 1.04 02/13/2023   BUN 26 (H) 02/13/2023   NA 144 02/13/2023   K 4.7 02/13/2023   CL 104 02/13/2023   CO2 26 02/13/2023   Lab Results  Component Value Date   ALT 38 02/13/2023   AST 23 02/13/2023   ALKPHOS 57 02/13/2023   BILITOT 0.5 02/13/2023   Lab Results  Component Value Date   HGBA1C 5.3 02/13/2023   HGBA1C 5.2 10/05/2022   HGBA1C 5.5 01/25/2021   HGBA1C 5.3 05/19/2020   Lab Results  Component Value Date   INSULIN 23.0 02/13/2023   Lab Results  Component Value Date   TSH 2.420 02/13/2023   Lab Results  Component Value Date  CHOL 161 02/13/2023   HDL 36 (L) 02/13/2023   LDLCALC 85 02/13/2023   TRIG 240 (H) 02/13/2023   CHOLHDL 3 10/05/2022   Lab Results  Component Value Date   VD25OH 29.3 (L) 02/13/2023   Lab Results  Component Value Date   WBC 7.3 02/13/2023   HGB 14.7 02/13/2023   HCT 44.6 02/13/2023   MCV 89 02/13/2023   PLT 166 02/13/2023   No results found for: "IRON", "TIBC", "FERRITIN"  Attestation Statements:   Reviewed by clinician on day of visit: allergies, medications, problem list, medical history, surgical history, family history, social history, and previous encounter notes.   I, Burt Knack, am acting as transcriptionist for Reuben Likes, MD.  I have reviewed the above documentation for accuracy and completeness, and I agree with the above. - Reuben Likes, MD

## 2023-05-13 ENCOUNTER — Other Ambulatory Visit: Payer: Self-pay | Admitting: Cardiology

## 2023-05-13 DIAGNOSIS — I10 Essential (primary) hypertension: Secondary | ICD-10-CM

## 2023-05-14 ENCOUNTER — Encounter: Payer: Self-pay | Admitting: Cardiology

## 2023-05-14 ENCOUNTER — Ambulatory Visit: Payer: 59 | Admitting: Cardiology

## 2023-05-14 VITALS — BP 145/83 | HR 68 | Resp 16 | Ht 68.0 in | Wt 321.0 lb

## 2023-05-14 DIAGNOSIS — I10 Essential (primary) hypertension: Secondary | ICD-10-CM

## 2023-05-14 DIAGNOSIS — I25118 Atherosclerotic heart disease of native coronary artery with other forms of angina pectoris: Secondary | ICD-10-CM

## 2023-05-14 MED ORDER — ZEPBOUND 7.5 MG/0.5ML ~~LOC~~ SOAJ
7.5000 mg | SUBCUTANEOUS | 2 refills | Status: DC
Start: 2023-05-14 — End: 2023-05-16

## 2023-05-14 NOTE — Progress Notes (Signed)
Primary Physician/Referring:  Ardith Dark, MD  Patient ID: Michael [name], male    DOB: 1966-05-25, 57 y.o.   MRN: [id number]  Chief Complaint  Patient presents with   Coronary artery disease of native artery of native heart wi   Primary hypertension   Class 3 severe obesity due to excess calories with serious     HPI:    Michael [name]  is a 57 y.o. Caucasian male with hypertension, hyperlipidemia, morbid obesity and obstructive sleep apnea on CPAP and compliant, CAD presenting with NSTEMI S/P  angioplasty and stenting to the right coronary artery.  He now presents here for management of hypertension and specifically obesity.  No chest pain, no dyspnea.    Patient remains stable without recurrence of angina pectoris.  Just returned from a honeymoon trip to [place] and [place], states that he walked pretty much and did not have any significant symptoms of chest pain or dyspnea.  Past Medical History:  Diagnosis Date   (HFpEF) heart failure with preserved ejection fraction (HCC)    a. 05/2020 Echo: EF 60%, no rwma, Gr2 DD. Triv MR.   Back pain    CAD (coronary artery disease)    a. 04/2020 NSTEMI/PCI: LM nl, LAD nl, LCX nl, RCA 2m, 85d (3.6x26 Resolute Onyx DES), RPDA nl, RPAV nl, EF 50-55%.   Dyshidrotic eczema    History of heart attack    Hyperlipidemia LDL goal <70 08/28/2018   Obesity hypoventilation syndrome (HCC)    Primary hypertension 08/28/2018   Sleep apnea    Sleep apnea with use of continuous positive airway pressure (CPAP) 08/27/2013   SOB (shortness of breath)    Swallowing difficulty    Viral meningitis    Past Surgical History:  Procedure Laterality Date   CARDIAC CATHETERIZATION     CORONARY STENT INTERVENTION N/A 05/20/2020   Procedure: CORONARY STENT INTERVENTION;  Surgeon: Yvonne Kendall, MD;  Location: [facility];  Service: Cardiovascular;  Laterality: N/A;   LEFT HEART CATH AND CORONARY ANGIOGRAPHY N/A 05/20/2020   Procedure: LEFT HEART CATH AND  CORONARY ANGIOGRAPHY;  Surgeon: Yvonne Kendall, MD;  Location: [facility];  Service: Cardiovascular;  Laterality: N/A;   Family History  Problem Relation Age of Onset   Alzheimer's disease Mother     Social History   Tobacco Use   Smoking status: Never   Smokeless tobacco: Never  Substance Use Topics   Alcohol use: Yes    Comment: rarely   Marital Status: Divorced  ROS  Review of Systems  Cardiovascular:  Negative for chest pain, dyspnea on exertion and leg swelling.  Respiratory:  Positive for snoring (on CPAP).   Musculoskeletal:  Negative for gout.  Neurological:  Negative for dizziness.   Objective  Blood pressure (!) 145/83, pulse 68, resp. rate 16, height 5\' 8"  (1.727 m), weight (!) 321 lb (145.6 kg), SpO2 96 %.     05/14/2023    2:30 PM 05/06/2023    2:00 PM 03/19/2023    2:00 PM  Vitals with BMI  Height 5\' 8"  5\' 8"  5\' 8"   Weight 321 lbs 310 lbs 297 lbs  BMI 48.82 47.15 45.17  Systolic 145 108 914  Diastolic 83 70 86  Pulse 68 72 74     Physical Exam Constitutional:      Comments: Morbidly obese in no acute distress.  Neck:     Vascular: No carotid bruit or JVD.  Cardiovascular:     Rate  and Rhythm: Normal rate and regular rhythm.     Pulses: Normal pulses and intact distal pulses.     Heart sounds: Normal heart sounds. No murmur heard.    No gallop.  Pulmonary:     Effort: Pulmonary effort is normal.     Breath sounds: Normal breath sounds.  Abdominal:     General: Bowel sounds are normal.     Palpations: Abdomen is soft.     Comments: Obese. Pannus present  Musculoskeletal:     Right lower leg: No edema.     Left lower leg: No edema.    Laboratory examination:   Lab Results  Component Value Date   NA 144 02/13/2023   K 4.7 02/13/2023   CO2 26 02/13/2023   GLUCOSE 89 02/13/2023   BUN 26 (H) 02/13/2023   CREATININE 1.04 02/13/2023   CALCIUM 9.5 02/13/2023   EGFR 84 02/13/2023   GFRNONAA >60 05/21/2020       Latest Ref Rng &  Units 02/13/2023    8:45 AM 10/05/2022    1:28 PM 01/25/2021    8:45 AM  CMP  Glucose 70 - 99 mg/dL 89  74  81   BUN 6 - 24 mg/dL 26  14  22    Creatinine 0.76 - 1.27 mg/dL 5.57  3.22  0.25   Sodium 134 - 144 mmol/L 144  140  140   Potassium 3.5 - 5.2 mmol/L 4.7  4.2  4.4   Chloride 96 - 106 mmol/L 104  105  104   CO2 20 - 29 mmol/L 26  26  27    Calcium 8.7 - 10.2 mg/dL 9.5  9.5  9.3   Total Protein 6.0 - 8.5 g/dL 7.0  7.1  6.9   Total Bilirubin 0.0 - 1.2 mg/dL 0.5  0.9  0.7   Alkaline Phos 44 - 121 IU/L 57  51  46   AST 0 - 40 IU/L 23  23  20    ALT 0 - 44 IU/L 38  31  23       Latest Ref Rng & Units 02/13/2023    8:45 AM 10/05/2022    1:28 PM 01/25/2021    8:45 AM  CBC  WBC 3.4 - 10.8 x10E3/uL 7.3  6.3  7.4   Hemoglobin 13.0 - 17.7 g/dL 42.7  06.2  37.6   Hematocrit 37.5 - 51.0 % 44.6  45.7  48.8   Platelets 150 - 450 x10E3/uL 166  167.0  150.0    Lipid Panel Recent Labs    10/05/22 1328 02/13/23 0845  CHOL 100 161  TRIG 110.0 240*  LDLCALC 45 85  VLDL 22.0  --   HDL 32.60* 36*  CHOLHDL 3  --    HEMOGLOBIN A1C Lab Results  Component Value Date   HGBA1C 5.3 02/13/2023   MPG 105.41 05/19/2020   TSH Recent Labs    10/05/22 1328 02/13/23 0845  TSH 1.95 2.420    Medications and allergies  No Known Allergies   Current Outpatient Medications:    acitretin (SORIATANE) 25 MG capsule, Take 25 mg by mouth daily before breakfast., Disp: , Rfl:    ASPIRIN LOW DOSE 81 MG tablet, TAKE 1 TABLET BY MOUTH EVERY DAY, Disp: 30 tablet, Rfl: 8   atorvastatin (LIPITOR) 80 MG tablet, TAKE 1 TABLET BY MOUTH EVERYDAY AT BEDTIME, Disp: 30 tablet, Rfl: 8   gabapentin (NEURONTIN) 300 MG capsule, Take 1 capsule (300 mg total) by mouth at bedtime., Disp:  90 capsule, Rfl: 3   metoprolol tartrate (LOPRESSOR) 25 MG tablet, TAKE 1 TABLET BY MOUTH TWICE A DAY, Disp: 60 tablet, Rfl: 5   Multiple Vitamin (MULTIVITAMIN) capsule, Take 1 capsule by mouth daily., Disp: , Rfl:    nitroGLYCERIN  (NITROSTAT) 0.4 MG SL tablet, Place 1 tablet (0.4 mg total) under the tongue every 5 (five) minutes x 3 doses as needed for chest pain., Disp: 25 tablet, Rfl: 3   sildenafil (VIAGRA) 100 MG tablet, TAKE 1 TABLET(100 MG) BY MOUTH DAILY AS NEEDED FOR ERECTILE DYSFUNCTION, Disp: 30 tablet, Rfl: 1   testosterone cypionate (DEPOTESTOSTERONE CYPIONATE) 200 MG/ML injection, Inject 1 mL (200 mg total) into the muscle every 14 (fourteen) days., Disp: 7 mL, Rfl: 1   tirzepatide (ZEPBOUND) 7.5 MG/0.5ML Pen, Inject 7.5 mg into the skin once a week., Disp: 2 mL, Rfl: 2   triamterene-hydrochlorothiazide (MAXZIDE-25) 37.5-25 MG tablet, TAKE 1 TABLET BY MOUTH EVERY DAY IN THE MORNING, Disp: 30 tablet, Rfl: 2   valsartan (DIOVAN) 160 MG tablet, TAKE 1 TABLET BY MOUTH EVERY DAY, Disp: 30 tablet, Rfl: 11   Vitamin D, Ergocalciferol, (DRISDOL) 1.25 MG (50000 UNIT) CAPS capsule, Take 1 capsule (50,000 Units total) by mouth every 7 (seven) days., Disp: 4 capsule, Rfl: 0   bumetanide (BUMEX) 0.5 MG tablet, TAKE 1 TABLET BY MOUTH EVERY DAY (Patient not taking: Reported on 05/14/2023), Disp: 30 tablet, Rfl: 5   CALCIUM MAGNESIUM ZINC PO, Take by mouth. (Patient not taking: Reported on 05/14/2023), Disp: , Rfl:    Krill Oil 300 MG CAPS, Take by mouth. (Patient not taking: Reported on 05/14/2023), Disp: , Rfl:      Radiology:   No results found.  Cardiac Studies:   Coronary angiogram 05/20/2020 Severe single-vessel coronary artery disease with sequential 40-50% mid and 80-90% distal RCA stenoses.  The distal RCA lesion appears ulcerated and is likely the culprit lesion for the patient's NSTEMI. No angiographically significant coronary artery disease involving the left coronary artery. Low normal left ventricular systolic function with basal and mid inferior hypokinesis.  Left ventricular filling pressure is moderately elevated (LVEDP ~30 mmHg). Successful PCI to the distal RCA using Resolute Onyx 3.5 x 26 mm drug-eluting  stent (postdilated to 4.1 mm) with 0% residual stenosis and TIMI-3 flow.  RCA Resolute Onyx 3.5 x 26 mm drug-eluting stent  Echocardiogram 05/21/2020:  1. Left ventricular ejection fraction, by estimation, is 60%. The left  ventricle has normal function. The left ventricle has no regional wall  motion abnormalities. There is moderate left ventricular hypertrophy. Left  ventricular diastolic parameters are consistent with Grade II diastolic dysfunction (pseudonormalization).   2. Right ventricular systolic function is normal. The right ventricular  size is normal. Tricuspid regurgitation signal is inadequate for assessing  PA pressure.   3. The mitral valve is grossly normal. Trivial mitral valve  regurgitation. No evidence of mitral stenosis.   4. The aortic valve is tricuspid. Aortic valve regurgitation is not visualized. No aortic stenosis is present.   5. The inferior vena cava is dilated in size with >50% respiratory  variability, suggesting right atrial pressure of 8 mmHg.  EKG  EKG 02/11/2023: Normal sinus rhythm at the rate of 69 beats minute, normal EKG.  Compared to 07/31/2021, no change.  Assessment     ICD-10-CM   1. Coronary artery disease of native artery of native heart with stable angina pectoris (HCC)  I25.118 tirzepatide (ZEPBOUND) 7.5 MG/0.5ML Pen    2. Primary hypertension  I10  3. Hyperlipidemia LDL goal <70  E78.5     4. Class 3 severe obesity due to excess calories with serious comorbidity and body mass index (BMI) of 50.0 to 59.9 in adult (HCC)  E66.01 tirzepatide (ZEPBOUND) 7.5 MG/0.5ML Pen   Z68.43      Meds ordered this encounter  Medications   tirzepatide (ZEPBOUND) 7.5 MG/0.5ML Pen    Sig: Inject 7.5 mg into the skin once a week.    Dispense:  2 mL    Refill:  2    Medications Discontinued During This Encounter  Medication Reason   Berberine Chloride (BERBERINE HCI PO)    MAGNESIUM PO    OZEMPIC, 2 MG/DOSE, 8 MG/3ML SOPN Change in therapy   No  orders of the defined types were placed in this encounter.    Ozempic.  He is tolerating. Specifically obesity Recommendations:   Michael [name]  is a 57 y.o. Caucasian male with hypertension, hyperlipidemia, morbid obesity and obstructive sleep apnea on CPAP and compliant, CAD presenting with NSTEMI S/P  angioplasty and stenting to the right coronary artery.  He now presents here for management of hypertension and specifically obesity.  1. Coronary artery disease of native artery of native heart with stable angina pectoris [facility]) Patient remains stable without recurrence of angina pectoris.  Just returned from a honeymoon trip to [place] and [place], states that he walked pretty much and did not have any significant symptoms of chest pain or dyspnea.  Blood pressure is well-controlled, lipids are under excellent control.  His risk factors except for obesity is well-controlled. - EKG 12-Lead  2. Class 3 severe obesity due to excess calories with serious comorbidity and body mass index (BMI) of 50.0 to 59.9 in adult [facility]) Obesity continues to be a major issue, presently tolerating GLP-1 agonist however no significant change in his weight.    Hence I will discontinue Ozempic and switch him to [facility] to see whether he will be successful.  Rx sent for 7.5 mg subcu q. weekly.  3. Primary hypertension Blood pressure under excellent control.  No changes in the medications needed.  I will see him back in 3 months for follow-up.    Yates Decamp, MD, [facility] 05/14/2023, 2:48 PM Office: [phone] Fax: [phone] Pager: [phone]

## 2023-05-15 ENCOUNTER — Encounter: Payer: Self-pay | Admitting: Cardiology

## 2023-05-16 ENCOUNTER — Other Ambulatory Visit: Payer: Self-pay

## 2023-05-16 ENCOUNTER — Ambulatory Visit: Payer: 59 | Admitting: Cardiology

## 2023-05-16 DIAGNOSIS — I25118 Atherosclerotic heart disease of native coronary artery with other forms of angina pectoris: Secondary | ICD-10-CM

## 2023-05-16 MED ORDER — ZEPBOUND 7.5 MG/0.5ML ~~LOC~~ SOAJ
7.5000 mg | SUBCUTANEOUS | 2 refills | Status: AC
Start: 2023-05-16 — End: ?

## 2023-05-20 ENCOUNTER — Ambulatory Visit (INDEPENDENT_AMBULATORY_CARE_PROVIDER_SITE_OTHER): Payer: 59 | Admitting: Family Medicine

## 2023-05-21 ENCOUNTER — Other Ambulatory Visit: Payer: Self-pay | Admitting: Cardiology

## 2023-05-21 ENCOUNTER — Other Ambulatory Visit: Payer: Self-pay | Admitting: Physician Assistant

## 2023-05-21 DIAGNOSIS — M10072 Idiopathic gout, left ankle and foot: Secondary | ICD-10-CM

## 2023-05-21 NOTE — Telephone Encounter (Signed)
Refill

## 2023-06-01 ENCOUNTER — Other Ambulatory Visit: Payer: Self-pay | Admitting: Cardiology

## 2023-06-01 DIAGNOSIS — I10 Essential (primary) hypertension: Secondary | ICD-10-CM

## 2023-06-03 ENCOUNTER — Other Ambulatory Visit (INDEPENDENT_AMBULATORY_CARE_PROVIDER_SITE_OTHER): Payer: Self-pay | Admitting: Family Medicine

## 2023-06-03 DIAGNOSIS — E559 Vitamin D deficiency, unspecified: Secondary | ICD-10-CM

## 2023-06-12 ENCOUNTER — Encounter: Payer: Self-pay | Admitting: Neurology

## 2023-06-13 ENCOUNTER — Ambulatory Visit: Payer: 59 | Admitting: Neurology

## 2023-06-13 ENCOUNTER — Encounter: Payer: Self-pay | Admitting: Neurology

## 2023-06-13 VITALS — Ht 69.0 in | Wt 326.0 lb

## 2023-06-13 DIAGNOSIS — Z9989 Dependence on other enabling machines and devices: Secondary | ICD-10-CM | POA: Diagnosis not present

## 2023-06-13 DIAGNOSIS — E662 Morbid (severe) obesity with alveolar hypoventilation: Secondary | ICD-10-CM

## 2023-06-13 DIAGNOSIS — G4731 Primary central sleep apnea: Secondary | ICD-10-CM | POA: Diagnosis not present

## 2023-06-13 NOTE — Patient Instructions (Signed)

## 2023-06-13 NOTE — Progress Notes (Signed)
Provider:  Melvyn Novas, MD  Primary Care Physician:  Ardith Dark, MD [address] [place] [address]     Referring Provider: Ardith Dark, Md [address] [place],  [place] [address]          Chief Complaint according to patient   Patient presents with:     New Patient (Initial Visit)     Patient last seen here 06-2020  Pt was not able to get a BiPAP due to out of pocket cost. States he had an old BiPAP machine that is not working anymore. States he is sleeping 5 hours at night and feels un-rested , sleepy and SOB. Cardiologist want's him back on BiPAP>       HISTORY OF PRESENT ILLNESS:  ANSELM AUMILLER is a 57 y.o. male patient who is here for delayed revisit 06/13/2023 for  needing a new BIPAP machine.  I have the pleasure of seeing Mr. Guadamuz today after 35 months interval.  He had undergone a sleep study with [place] in 2014 he was diagnosed with severe obstructive sleep apnea developed central apnea developed heart failure in 2020 and since then was BiPAP dependent. Because of the pandemic related supply issues he was never given a new machine then his sleep study became invalid for a new prescription and he could continue for a while to use his old machine but this has now failed.  The humidifier is broken it is very unpleasant to use and he stopped using it but he misses a deep sleep.  He would like to restart and for this reason today I meet with him and like for him to replace his BiPAP machine with similar settings.  He will use a BiPAP given that his last sleep study showed severe apnea with an AHI of 56.4.  If his insurance does not accept this order without a new sleep study we will urgently do a home sleep test.  The patient is diagnosed with obesity hypoventilation syndrome, central apneas arising under CPAP.  Tolerance issues of CPAP and therefore changed to BiPAP ventilation, he has coronary heart artery disease, heart failure with preserved ejection  fraction.  The Epworth Sleepiness Scale was endorsed at 15 points fatigue severity endorsed at 6 by set out of 63 points.  .  Chief concern according to patient :  I can't sleep well without my biPAP and my cardiologist needs to back.     New Patient (Initial Visit)       pt has not been seen here since 2015. He suffered a Myocardial infarction 5 weeks ago- Dr Jacinto Halim. he states that CPAP machine in the last month he is having dry mouth. he has been purchasing supplies from [employer]. last SS was 03/2013      HISTORY OF PRESENT ILLNESS:  VIREN LEBEAU is a 57 y.o. year old White or Caucasian male patient seen here as a re-referral on 06/30/2020 from Dr Jacinto Halim, MD - his cardiologist . He had last been seen here in May 2014.   Chief concern according to patient :   Mr. Winkowski has a CPAP machine and uses with supplies from online services his last sleep study was through our office in May 2014 and he has used the same machine for the last 7 years.  At this time he complains about a dry mouth after using his CPAP machine also his CPAP machine would be considered obsolete by now.  I have the pleasure of seeing ABISHAI VIEGAS today, a right -handed Caucasian male has a past medical history of (HFpEF) heart failure with preserved ejection fraction (HCC), CAD (coronary artery disease), Hyperlipidemia LDL goal <70 (08/28/2018), Obesity hypoventilation syndrome (HCC), Primary hypertension (08/28/2018), Sleep apnea, Sleep apnea with use of continuous positive airway pressure (CPAP) (08/27/2013), and Viral meningitis, at age 28.   He suffered a Myocardial infarction 5 weeks ago- Dr Jacinto Halim. He lost both parents to COVID in January 2021.    The patient had the sleep study in the year 2014 study was performed on 27 Mar 2013 upon referral by Sigmund Hazel, MD.  The patient had endorsed at that time the Epworth Sleepiness Scale at 18 out of 24 points his BMI was 55 his neck circumference 20 inches and he was diagnosed  with very severe sleep apnea.  His AHI was 121.8 he slept all night in a nonsupine position and he did not reach REM sleep.  He did retain CO2.  He was 61 minutes of the total sleep time below 89% oxygen saturation was a nadir at 76% saturation.  CPAP was initiated at 5 and titrated to 10 it was then changed to BiPAP for there was a marginal improvement.  Patient was asked to start BiPAP with a ramp of 19/613 cmH2O at the time he used a [employer] full facemask and medium size he still uses this model.   I was also able to review the patient's compliance download he has used the machine 25 out of 30 days this is a BiPAP easy breeze machine so-called variable.  His inspiratory pressure is 19 cmH2O expiratory pressure 13 cmH2O and he reaches a residual AHI of only 1.1.  This is an absolute great result he does have quite a bit of air leakage he does not have a significant number of central apneas.  What I cannot tell is how high or low his oxygen level may be.  Based on this I would like him to continue with similar settings but his machine again is 56 years old and will no longer be software supported.  My goal would be to do a split-night polysomnography.     Sleep relevant medical history: lost 80 pounds with diet and exercise.  he stopped using CPAP but as he regained weight  He started again , about 4-5 month ago.   Family medical /sleep history: No other family member on CPAP with OSA, insomnia, sleep walkers.       Review of Systems: Out of a complete 14 system review, the patient complains of only the following symptoms, and all other reviewed systems are negative.:  Fatigue, sleepiness , snoring, fragmented sleep, Insomnia, since BiPAP is broken.    How likely are you to doze in the following situations: 0 = not likely, 1 = slight chance, 2 = moderate chance, 3 = high chance   Sitting and Reading? Watching Television? Sitting inactive in a public place (theater or meeting)? As a  passenger in a car for an hour without a break? Lying down in the afternoon when circumstances permit? Sitting and talking to someone? Sitting quietly after lunch without alcohol? In a car, while stopped for a few minutes in traffic?   Total = 15/ 24 points   FSS endorsed at 55/ 63 points.   Social History   Socioeconomic History   Marital status: Divorced    Spouse name: Not on file   Number of children: 1  Years of education: 14   Highest education level: Not on file  Occupational History    Employer: [employer]   Occupation: Customer service manager  Tobacco Use   Smoking status: Never   Smokeless tobacco: Never  Vaping Use   Vaping status: Never Used  Substance and Sexual Activity   Alcohol use: Yes    Comment: rarely   Drug use: No   Sexual activity: Not on file  Other Topics Concern   Not on file  Social History Narrative   Patient is single and lives alone.   Patient is working full-time.   Patient has a college education.   Patient is right-handed.   Patient drinks 3-4 cups of coffee daily.   Social Determinants of Health   Financial Resource Strain: Not on file  Food Insecurity: Not on file  Transportation Needs: Not on file  Physical Activity: Not on file  Stress: Not on file  Social Connections: Not on file    Family History  Problem Relation Age of Onset   Alzheimer's disease Mother     Past Medical History:  Diagnosis Date   (HFpEF) heart failure with preserved ejection fraction (HCC)    a. 05/2020 Echo: EF 60%, no rwma, Gr2 DD. Triv MR.   Back pain    CAD (coronary artery disease)    a. 04/2020 NSTEMI/PCI: LM nl, LAD nl, LCX nl, RCA 83m, 85d (3.6x26 Resolute Onyx DES), RPDA nl, RPAV nl, EF 50-55%.   Dyshidrotic eczema    History of heart attack    Hyperlipidemia LDL goal <70 08/28/2018   Obesity hypoventilation syndrome (HCC)    Primary hypertension 08/28/2018   Sleep apnea    Sleep apnea with use of continuous positive airway pressure (CPAP)  08/27/2013   SOB (shortness of breath)    Swallowing difficulty    Viral meningitis     Past Surgical History:  Procedure Laterality Date   CARDIAC CATHETERIZATION     CORONARY STENT INTERVENTION N/A 05/20/2020   Procedure: CORONARY STENT INTERVENTION;  Surgeon: Yvonne Kendall, MD;  Location: [facility];  Service: Cardiovascular;  Laterality: N/A;   LEFT HEART CATH AND CORONARY ANGIOGRAPHY N/A 05/20/2020   Procedure: LEFT HEART CATH AND CORONARY ANGIOGRAPHY;  Surgeon: Yvonne Kendall, MD;  Location: [facility];  Service: Cardiovascular;  Laterality: N/A;     Current Outpatient Medications on File Prior to Visit  Medication Sig Dispense Refill   acitretin (SORIATANE) 25 MG capsule Take 25 mg by mouth daily before breakfast.     ASPIRIN LOW DOSE 81 MG tablet TAKE 1 TABLET BY MOUTH EVERY DAY 30 tablet 8   atorvastatin (LIPITOR) 80 MG tablet TAKE 1 TABLET BY MOUTH EVERYDAY AT BEDTIME 30 tablet 8   gabapentin (NEURONTIN) 300 MG capsule Take 1 capsule (300 mg total) by mouth at bedtime. 90 capsule 3   metoprolol tartrate (LOPRESSOR) 25 MG tablet TAKE 1 TABLET BY MOUTH TWICE A DAY 60 tablet 5   Multiple Vitamin (MULTIVITAMIN) capsule Take 1 capsule by mouth daily.     nitroGLYCERIN (NITROSTAT) 0.4 MG SL tablet Place 1 tablet (0.4 mg total) under the tongue every 5 (five) minutes x 3 doses as needed for chest pain. 25 tablet 3   sildenafil (VIAGRA) 100 MG tablet TAKE 1 TABLET(100 MG) BY MOUTH DAILY AS NEEDED FOR ERECTILE DYSFUNCTION 30 tablet 1   testosterone cypionate (DEPOTESTOSTERONE CYPIONATE) 200 MG/ML injection Inject 1 mL (200 mg total) into the muscle every 14 (fourteen) days.  7 mL 1   tirzepatide (ZEPBOUND) 7.5 MG/0.5ML Pen Inject 7.5 mg into the skin once a week. 2 mL 2   traZODone (DESYREL) 100 MG tablet Take 100 mg by mouth at bedtime.     triamterene-hydrochlorothiazide (MAXZIDE-25) 37.5-25 MG tablet TAKE 1 TABLET BY MOUTH EVERY DAY IN THE MORNING 30 tablet 2    valsartan (DIOVAN) 160 MG tablet TAKE 1 TABLET BY MOUTH EVERY DAY 30 tablet 11   Vitamin D, Ergocalciferol, (DRISDOL) 1.25 MG (50000 UNIT) CAPS capsule Take 1 capsule (50,000 Units total) by mouth every 7 (seven) days. 4 capsule 0   bumetanide (BUMEX) 0.5 MG tablet TAKE 1 TABLET BY MOUTH EVERY DAY (Patient not taking: Reported on 05/14/2023) 30 tablet 5   CALCIUM MAGNESIUM ZINC PO Take by mouth. (Patient not taking: Reported on 05/14/2023)     colchicine 0.6 MG tablet TAKE 1 TABLET BY MOUTH 2 TIMES DAILY. (Patient not taking: Reported on 06/13/2023) 12 tablet 0   Krill Oil 300 MG CAPS Take by mouth. (Patient not taking: Reported on 05/14/2023)     No current facility-administered medications on file prior to visit.    No Known Allergies   DIAGNOSTIC DATA (LABS, IMAGING, TESTING) - I reviewed patient records, labs, notes, testing and imaging myself where available.  Lab Results  Component Value Date   WBC 7.3 02/13/2023   HGB 14.7 02/13/2023   HCT 44.6 02/13/2023   MCV 89 02/13/2023   PLT 166 02/13/2023      Component Value Date/Time   NA 144 02/13/2023 0845   K 4.7 02/13/2023 0845   CL 104 02/13/2023 0845   CO2 26 02/13/2023 0845   GLUCOSE 89 02/13/2023 0845   GLUCOSE 74 10/05/2022 1328   BUN 26 (H) 02/13/2023 0845   CREATININE 1.04 02/13/2023 0845   CALCIUM 9.5 02/13/2023 0845   PROT 7.0 02/13/2023 0845   ALBUMIN 4.3 02/13/2023 0845   AST 23 02/13/2023 0845   ALT 38 02/13/2023 0845   ALKPHOS 57 02/13/2023 0845   BILITOT 0.5 02/13/2023 0845   GFRNONAA >60 05/21/2020 0701   GFRAA >60 05/21/2020 0701   Lab Results  Component Value Date   CHOL 161 02/13/2023   HDL 36 (L) 02/13/2023   LDLCALC 85 02/13/2023   TRIG 240 (H) 02/13/2023   CHOLHDL 3 10/05/2022   Lab Results  Component Value Date   HGBA1C 5.3 02/13/2023   Lab Results  Component Value Date   VITAMINB12 789 02/13/2023   Lab Results  Component Value Date   TSH 2.420 02/13/2023    PHYSICAL  EXAM:  Today's Vitals   06/13/23 1513  Weight: (!) 326 lb (147.9 kg)  Height: 5\' 9"  (1.753 m)   Body mass index is 48.14 kg/m.   Wt Readings from Last 3 Encounters:  06/13/23 (!) 326 lb (147.9 kg)  05/14/23 (!) 321 lb (145.6 kg)  05/06/23 (!) 310 lb (140.6 kg)     Ht Readings from Last 3 Encounters:  06/13/23 5\' 9"  (1.753 m)  05/14/23 5\' 8"  (1.727 m)  05/06/23 5\' 8"  (1.727 m)      General: The patient is awake, alert and appears not in acute distress. The patient is well groomed. Head: Normocephalic, atraumatic. Neck is supple.  Mallampati 3 plus ,  neck circumference:19.5 inches . Nasal airflow patent.  Retrognathia is not seen.  Dental status:  Cardiovascular:  Regular rate and cardiac rhythm by pulse,  without distended neck veins. Respiratory: Lungs are clear to auscultation.  Skin:  With evidence of ankle edema, with a rash. Trunk: The patient's posture is erect.   NEUROLOGIC EXAM: The patient is awake and alert, oriented to place and time.   Memory subjective described as intact.  Attention span & concentration ability appears normal.  Speech is fluent,  without  dysarthria, dysphonia or aphasia.  Mood and affect are appropriate.   Cranial nerves: no loss of smell or taste reported  Pupils are equal and briskly reactive to light.  Extraocular movements in vertical and horizontal planes were intact and without nystagmus. No Diplopia. Visual fields by finger perimetry are intact. Hearing was intact to soft voice and finger rubbing.    Facial sensation intact to fine touch.  Facial motor strength is symmetric and tongue and uvula move midline.  Neck ROM : rotation, tilt and flexion extension were normal for age and shoulder shrug was symmetrical.    Motor exam:  Symmetric bulk, tone and ROM.   Normal tone without cog wheeling, symmetric grip strength .   Sensory:  Fine touch,vibration were intact  Proprioception tested in the upper extremities was normal.    Coordination: Rapid alternating movements in the fingers/hands were of normal speed.  The Finger-to-nose maneuver was intact without evidence of ataxia, dysmetria or tremor.   Gait and station: Patient could rise unassisted from a seated position, walked without assistive device.  Stance is of wider base and the patient turned with 4 steps.  Toe and heel walk were deferred.  Deep tendon reflexes: in the  upper and lower extremities are attenuated . Babinski response was deferred     ASSESSMENT AND PLAN 57 y.o. year old male  here with:    1)  need to continue auto BIPAP therapy, If insurance doesn't accept prescription without a new CPAP/ BiPAP titration will need  to do in-lab BiPAP titration first.   2)  I will write for an auto BIPAP machine with 5 cm spread , starting at 10/5 and going up to 19/14 cm water. Easy breathe on.  Humidifier, FFM  large.   3) I ordered an in lab BIPAP titration just in case.   I plan to follow up either personally or through our NP within 4-6 months.   CC: I will share my notes with Dr Jacinto Halim. .  After spending a total time of  40  minutes face to face and additional time for physical and neurologic examination, review of laboratory studies,  personal review of imaging studies, reports and results of other testing and review of referral information / records as far as provided in visit,   Electronically signed by: Melvyn Novas, MD 06/13/2023 3:45 PM  [facility] and [facility] Sleep Board certified by The [employer] of Sleep Medicine and Diplomate of the [employer] of Sleep Medicine. Board certified In Neurology through the ABPN, Fellow of the [employer] of Neu[employer].

## 2023-06-17 ENCOUNTER — Telehealth: Payer: Self-pay

## 2023-06-18 ENCOUNTER — Ambulatory Visit (INDEPENDENT_AMBULATORY_CARE_PROVIDER_SITE_OTHER): Payer: 59 | Admitting: Family Medicine

## 2023-06-19 ENCOUNTER — Encounter: Payer: Self-pay | Admitting: Physician Assistant

## 2023-06-19 ENCOUNTER — Ambulatory Visit: Payer: 59 | Admitting: Neurology

## 2023-06-19 ENCOUNTER — Telehealth (INDEPENDENT_AMBULATORY_CARE_PROVIDER_SITE_OTHER): Payer: 59 | Admitting: Physician Assistant

## 2023-06-19 VITALS — Ht 69.0 in | Wt 320.0 lb

## 2023-06-19 DIAGNOSIS — R6889 Other general symptoms and signs: Secondary | ICD-10-CM

## 2023-06-19 MED ORDER — COVID-19 ANTIBODY TEST VI KIT: PACK | 1 refills | Status: DC

## 2023-06-19 MED ORDER — AMOXICILLIN-POT CLAVULANATE 875-125 MG PO TABS: 1.0000 | ORAL_TABLET | Freq: Two times a day (BID) | ORAL | 0 refills | Status: DC

## 2023-06-19 NOTE — Progress Notes (Signed)
I acted as a Neurosurgeon for [employer], PA-C Corky Mull, LPN  Virtual Visit via Video Note   I, Michael Motto, PA, connected with  Michael [name]  ([id number], Jun 25, 1966) on 06/19/23 at 11:20 AM EDT by a video-enabled telemedicine application and verified that I am speaking with the correct person using two identifiers.  Location: Patient: Home Provider:  [place] office   I discussed the limitations of evaluation and management by telemedicine and the availability of in person appointments. The patient expressed understanding and agreed to proceed.    History of Present Illness: Michael [name] is a 57 y.o. who identifies as a male who was assigned male at birth, and is being seen today for cough. Pt c/o cough since Saturday night, coughing and expectorating yellow sputum, headache and fatigue, congestion and sinus pressure behind his eyes, nasal congestion and some wheezing. Did 3 COVID tests were Neg,but were expired 01/16/2022.   Feels better today than yesterday. Denies history of asthma, has used inhaler in the past. Has fair appetite. Drinking fluids but not much water.   Problems:  Patient Active Problem List   Diagnosis Date Noted   Obesity with alveolar hypoventilation and body mass index (BMI) of 40 or greater (HCC) 06/13/2023   Dependence on bilevel positive airway pressure (BiPAP) ventilation due to central sleep apnea 06/13/2023   Gout 10/05/2022   Low testosterone 08/16/2020   CAD (coronary artery disease) s/p NSTEMI 2021 05/21/2020   (HFpEF) heart failure with preserved ejection fraction (HCC) 05/21/2020   Dyshidrosis 08/31/2019   Insomnia 08/31/2019   Hyperlipidemia LDL goal <70 08/28/2018   Erectile dysfunction 08/28/2018   Primary hypertension 08/28/2018   Obesity, morbid (HCC) 02/24/2014   OSA treated with BiPAP 08/27/2013   Nocturia 08/27/2013   Obesity hypoventilation syndrome (HCC)     Allergies: No Known Allergies Medications:   Current Outpatient Medications:    acitretin (SORIATANE) 25 MG capsule, Take 25 mg by mouth daily before breakfast., Disp: , Rfl:    amoxicillin-clavulanate (AUGMENTIN) 875-125 MG tablet, Take 1 tablet by mouth 2 (two) times daily., Disp: 10 tablet, Rfl: 0   ASPIRIN LOW DOSE 81 MG tablet, TAKE 1 TABLET BY MOUTH EVERY DAY, Disp: 30 tablet, Rfl: 8   atorvastatin (LIPITOR) 80 MG tablet, TAKE 1 TABLET BY MOUTH EVERYDAY AT BEDTIME, Disp: 30 tablet, Rfl: 8   bumetanide (BUMEX) 0.5 MG tablet, TAKE 1 TABLET BY MOUTH EVERY DAY, Disp: 30 tablet, Rfl: 5   COVID-19 Antibody Test KIT, Use per package instructions, Disp: 1 kit, Rfl: 1   gabapentin (NEURONTIN) 300 MG capsule, Take 1 capsule (300 mg total) by mouth at bedtime., Disp: 90 capsule, Rfl: 3   metoprolol tartrate (LOPRESSOR) 25 MG tablet, TAKE 1 TABLET BY MOUTH TWICE A DAY, Disp: 60 tablet, Rfl: 5   Multiple Vitamin (MULTIVITAMIN) capsule, Take 1 capsule by mouth daily., Disp: , Rfl:    nitroGLYCERIN (NITROSTAT) 0.4 MG SL tablet, Place 1 tablet (0.4 mg total) under the tongue every 5 (five) minutes x 3 doses as needed for chest pain., Disp: 25 tablet, Rfl: 3   sildenafil (VIAGRA) 100 MG tablet, TAKE 1 TABLET(100 MG) BY MOUTH DAILY AS NEEDED FOR ERECTILE DYSFUNCTION, Disp: 30 tablet, Rfl: 1   traZODone (DESYREL) 100 MG tablet, Take 100 mg by mouth at bedtime., Disp: , Rfl:    triamterene-hydrochlorothiazide (MAXZIDE-25) 37.5-25 MG tablet, TAKE 1 TABLET BY MOUTH EVERY DAY IN THE MORNING (Patient taking differently: every other day.), Disp:  30 tablet, Rfl: 2   valsartan (DIOVAN) 160 MG tablet, TAKE 1 TABLET BY MOUTH EVERY DAY, Disp: 30 tablet, Rfl: 11   Zinc 30 MG CAPS, Take 1 capsule by mouth daily in the afternoon., Disp: , Rfl:    testosterone cypionate (DEPOTESTOSTERONE CYPIONATE) 200 MG/ML injection, Inject 1 mL (200 mg total) into the muscle every 14 (fourteen) days. (Patient not taking: Reported on 06/19/2023), Disp: 7 mL, Rfl: 1   tirzepatide  (ZEPBOUND) 7.5 MG/0.5ML Pen, Inject 7.5 mg into the skin once a week. (Patient not taking: Reported on 06/19/2023), Disp: 2 mL, Rfl: 2   Vitamin D, Ergocalciferol, (DRISDOL) 1.25 MG (50000 UNIT) CAPS capsule, Take 1 capsule (50,000 Units total) by mouth every 7 (seven) days. (Patient not taking: Reported on 06/19/2023), Disp: 4 capsule, Rfl: 0  Observations/Objective: Patient is well-developed, well-nourished in no acute distress.  Resting comfortably  at home.  Head is normocephalic, atraumatic.  No labored breathing.  Speech is clear and coherent with logical content.  Patient is alert and oriented at baseline.    Assessment and Plan: 1. Flu-like symptoms No red flags on discussion, patient is not in any obvious distress during our visit.  Discussed progression of most viral illness, and recommended supportive care at this point in time. I did however provide pocket rx for oral augmentin should symptoms not improve as anticipated.  Discussed over the counter supportive care options, with recommendations to push fluids and rest. Reviewed return precautions including new/worsening fever, SOB, new/worsening cough or other concerns.  Recommended need to self-quarantine and practice social distancing until symptoms resolve. I recommend that patient follow-up if symptoms worsen or persist despite treatment x 7-10 days, sooner if needed.   Follow Up Instructions: I discussed the assessment and treatment plan with the patient. The patient was provided an opportunity to ask questions and all were answered. The patient agreed with the plan and demonstrated an understanding of the instructions.  A copy of instructions were sent to the patient via MyChart unless otherwise noted below.   The patient was advised to call back or seek an in-person evaluation if the symptoms worsen or if the condition fails to improve as anticipated.  Michael [name], [place]

## 2023-06-26 ENCOUNTER — Telehealth: Payer: Self-pay | Admitting: Neurology

## 2023-06-26 NOTE — Telephone Encounter (Signed)
NPS[facility]- [employer] pending uploaded notes.  

## 2023-07-08 NOTE — Telephone Encounter (Signed)
Bipap [employer] Berkley Harvey: [id number] (exp. 06/26/23 to11/5/24)  Sent mychart message.

## 2023-07-29 ENCOUNTER — Other Ambulatory Visit: Payer: Self-pay | Admitting: Cardiology

## 2023-08-15 ENCOUNTER — Ambulatory Visit: Payer: 59 | Admitting: Cardiology

## 2023-08-28 ENCOUNTER — Other Ambulatory Visit: Payer: Self-pay | Admitting: Cardiology

## 2023-08-28 DIAGNOSIS — I10 Essential (primary) hypertension: Secondary | ICD-10-CM

## 2023-09-15 ENCOUNTER — Encounter (HOSPITAL_BASED_OUTPATIENT_CLINIC_OR_DEPARTMENT_OTHER): Payer: Self-pay

## 2023-09-15 ENCOUNTER — Emergency Department (HOSPITAL_BASED_OUTPATIENT_CLINIC_OR_DEPARTMENT_OTHER)
Admission: EM | Admit: 2023-09-15 | Discharge: 2023-09-16 | Disposition: A | Discharge status: Home / Self Care | Payer: 59 | Attending: Emergency Medicine | Admitting: Emergency Medicine

## 2023-09-15 ENCOUNTER — Other Ambulatory Visit: Payer: Self-pay

## 2023-09-15 ENCOUNTER — Encounter: Payer: Self-pay | Admitting: Cardiology

## 2023-09-15 ENCOUNTER — Emergency Department (HOSPITAL_BASED_OUTPATIENT_CLINIC_OR_DEPARTMENT_OTHER): Payer: 59

## 2023-09-15 DIAGNOSIS — I2510 Atherosclerotic heart disease of native coronary artery without angina pectoris: Secondary | ICD-10-CM | POA: Insufficient documentation

## 2023-09-15 DIAGNOSIS — I10 Essential (primary) hypertension: Secondary | ICD-10-CM | POA: Insufficient documentation

## 2023-09-15 DIAGNOSIS — I776 Arteritis, unspecified: Secondary | ICD-10-CM | POA: Diagnosis not present

## 2023-09-15 DIAGNOSIS — Z79899 Other long term (current) drug therapy: Secondary | ICD-10-CM | POA: Insufficient documentation

## 2023-09-15 DIAGNOSIS — R21 Rash and other nonspecific skin eruption: Secondary | ICD-10-CM | POA: Diagnosis present

## 2023-09-15 DIAGNOSIS — R509 Fever, unspecified: Secondary | ICD-10-CM

## 2023-09-15 LAB — CBC WITH DIFFERENTIAL/PLATELET
Abs Immature Granulocytes: 0.05 10*3/uL (ref 0.00–0.07)
Basophils Absolute: 0 10*3/uL (ref 0.0–0.1)
Basophils Relative: 0 %
Eosinophils Absolute: 1 10*3/uL — ABNORMAL HIGH (ref 0.0–0.5)
Eosinophils Relative: 12 %
HCT: 40.1 % (ref 39.0–52.0)
Hemoglobin: 13.5 g/dL (ref 13.0–17.0)
Immature Granulocytes: 1 %
Lymphocytes Relative: 8 %
Lymphs Abs: 0.7 10*3/uL (ref 0.7–4.0)
MCH: 28.6 pg (ref 26.0–34.0)
MCHC: 33.7 g/dL (ref 30.0–36.0)
MCV: 85 fL (ref 80.0–100.0)
Monocytes Absolute: 0.3 10*3/uL (ref 0.1–1.0)
Monocytes Relative: 4 %
Neutro Abs: 6.3 10*3/uL (ref 1.7–7.7)
Neutrophils Relative %: 75 %
Platelets: 147 10*3/uL — ABNORMAL LOW (ref 150–400)
RBC: 4.72 MIL/uL (ref 4.22–5.81)
RDW: 13.6 % (ref 11.5–15.5)
WBC: 8.4 10*3/uL (ref 4.0–10.5)
nRBC: 0 % (ref 0.0–0.2)

## 2023-09-15 LAB — COMPREHENSIVE METABOLIC PANEL
ALT: 38 U/L (ref 0–44)
AST: 24 U/L (ref 15–41)
Albumin: 4.3 g/dL (ref 3.5–5.0)
Alkaline Phosphatase: 38 U/L (ref 38–126)
Anion gap: 8 (ref 5–15)
BUN: 20 mg/dL (ref 6–20)
CO2: 27 mmol/L (ref 22–32)
Calcium: 9.3 mg/dL (ref 8.9–10.3)
Chloride: 99 mmol/L (ref 98–111)
Creatinine, Ser: 1.59 mg/dL — ABNORMAL HIGH (ref 0.61–1.24)
GFR, Estimated: 51 mL/min — ABNORMAL LOW (ref >60)
Glucose, Bld: 114 mg/dL — ABNORMAL HIGH (ref 70–99)
Potassium: 4 mmol/L (ref 3.5–5.1)
Sodium: 134 mmol/L — ABNORMAL LOW (ref 135–145)
Total Bilirubin: 1.4 mg/dL — ABNORMAL HIGH (ref 0.3–1.2)
Total Protein: 7 g/dL (ref 6.5–8.1)

## 2023-09-15 LAB — LACTIC ACID, PLASMA: Lactic Acid, Venous: 1 mmol/L (ref 0.5–1.9)

## 2023-09-15 MED ORDER — SODIUM CHLORIDE 0.9 % IV SOLN
2.0000 g | Freq: Once | INTRAVENOUS | Status: AC
  Administered 2023-09-16: 2 g via INTRAVENOUS
  Filled 2023-09-15: qty 20

## 2023-09-15 MED ORDER — DOXYCYCLINE HYCLATE 100 MG PO TABS
100.0000 mg | ORAL_TABLET | Freq: Once | ORAL | Status: AC
  Administered 2023-09-15: 100 mg via ORAL
  Filled 2023-09-15: qty 1

## 2023-09-15 MED ORDER — VANCOMYCIN HCL IN DEXTROSE 1-5 GM/200ML-% IV SOLN
1000.0000 mg | Freq: Once | INTRAVENOUS | Status: AC
  Administered 2023-09-16: 1000 mg via INTRAVENOUS
  Filled 2023-09-15: qty 200

## 2023-09-15 MED ORDER — FAMOTIDINE IN NACL 20-0.9 MG/50ML-% IV SOLN
20.0000 mg | Freq: Once | INTRAVENOUS | Status: AC
  Administered 2023-09-15: 20 mg via INTRAVENOUS
  Filled 2023-09-15: qty 50

## 2023-09-15 MED ORDER — DIPHENHYDRAMINE HCL 50 MG/ML IJ SOLN
25.0000 mg | Freq: Once | INTRAMUSCULAR | Status: AC
  Administered 2023-09-15: 25 mg via INTRAVENOUS
  Filled 2023-09-15: qty 1

## 2023-09-15 MED ORDER — LACTATED RINGERS IV BOLUS
1000.0000 mL | Freq: Once | INTRAVENOUS | Status: AC
  Administered 2023-09-16: 1000 mL via INTRAVENOUS

## 2023-09-15 MED ORDER — FENTANYL CITRATE PF 50 MCG/ML IJ SOSY
50.0000 ug | PREFILLED_SYRINGE | Freq: Once | INTRAMUSCULAR | Status: AC
  Administered 2023-09-16: 50 ug via INTRAVENOUS
  Filled 2023-09-15: qty 1

## 2023-09-15 NOTE — ED Provider Notes (Signed)
[place] EMERGENCY DEPARTMENT AT [facility] Provider Note   CSN: [id number] Arrival date & time: 09/15/23  2105     History {Add pertinent medical, surgical, social history, OB history to HPI:1} Chief Complaint  Patient presents with   Rash    Michael [name] is a 57 y.o. male.  Patient with a history of CAD status post stent, hypertension, dyshidrotic eczema, sleep apnea presenting with painful rash to bilateral lower extremities that is progressively worsening.  States 2 days ago he noted a red blotchy itchy painful rash to his left lower leg.  This is since progressed up his entire left leg and now involving his right leg as well.  Rash is progressed to his lower abdomen and back.  He describes a burning painful itchy sensation.  Has had fever at home as well as to 103.  No chest pain or shortness of breath.  No oral or genital lesions.  No recent travel or sick contacts.  He reports being bitten by "something" to the back of his left leg about 3 weeks ago but was not sure what it was.  Does not believe it was a tick.  No travel outside of the country.  No one else with similar rash.  No difficulty breathing or difficulty swallowing.  No new exposures. Describes pain itching and redness extending from his left leg all the way up to his abdomen.  Took Benadryl at home without relief.  Does not feel short of breath.  No chest pain.  No tongue or lip swelling.  The history is provided by the patient and the spouse.  Rash Associated symptoms: joint pain and myalgias   Associated symptoms: no abdominal pain, no fever, no headaches, no nausea, no shortness of breath and not vomiting        Home Medications Prior to Admission medications   Medication Sig Start Date End Date Taking? Authorizing Provider  acitretin (SORIATANE) 25 MG capsule Take 25 mg by mouth daily before breakfast.    [provider]  amoxicillin-clavulanate (AUGMENTIN) 875-125 MG tablet Take 1 tablet  by mouth 2 (two) times daily. 06/19/23   [name], PA  ASPIRIN LOW DOSE 81 MG tablet TAKE 1 TABLET BY MOUTH EVERY DAY 04/29/23   Yates Decamp, MD  atorvastatin (LIPITOR) 80 MG tablet TAKE 1 TABLET BY MOUTH EVERYDAY AT BEDTIME 07/29/23   Yates Decamp, MD  bumetanide (BUMEX) 0.5 MG tablet TAKE 1 TABLET BY MOUTH EVERY DAY 02/05/23   Yates Decamp, MD  COVID-19 Antibody Test KIT Use per package instructions 06/19/23   [name], PA  gabapentin (NEURONTIN) 300 MG capsule Take 1 capsule (300 mg total) by mouth at bedtime. 10/05/22   Ardith Dark, MD  metoprolol tartrate (LOPRESSOR) 25 MG tablet TAKE 1 TABLET BY MOUTH TWICE A DAY 08/28/23   Yates Decamp, MD  Multiple Vitamin (MULTIVITAMIN) capsule Take 1 capsule by mouth daily.    [provider]  nitroGLYCERIN (NITROSTAT) 0.4 MG SL tablet Place 1 tablet (0.4 mg total) under the tongue every 5 (five) minutes x 3 doses as needed for chest pain. 05/21/20   Creig Hines, NP  sildenafil (VIAGRA) 100 MG tablet TAKE 1 TABLET(100 MG) BY MOUTH DAILY AS NEEDED FOR ERECTILE DYSFUNCTION 12/31/22   Yates Decamp, MD  testosterone cypionate (DEPOTESTOSTERONE CYPIONATE) 200 MG/ML injection Inject 1 mL (200 mg total) into the muscle every 14 (fourteen) days. Patient not taking: Reported on 06/19/2023 10/17/22   Ardith Dark, MD  tirzepatide (ZEPBOUND) 7.5 MG/0.5ML Pen Inject 7.5 mg into the skin once a week. Patient not taking: Reported on 06/19/2023 05/16/23   Yates Decamp, MD  traZODone (DESYREL) 100 MG tablet Take 100 mg by mouth at bedtime.    [provider]  triamterene-hydrochlorothiazide (MAXZIDE-25) 37.5-25 MG tablet TAKE 1 TABLET BY MOUTH EVERY DAY IN THE MORNING 08/28/23   Yates Decamp, MD  valsartan (DIOVAN) 160 MG tablet TAKE 1 TABLET BY MOUTH EVERY DAY 05/13/23   Yates Decamp, MD  Vitamin D, Ergocalciferol, (DRISDOL) 1.25 MG (50000 UNIT) CAPS capsule Take 1 capsule (50,000 Units total) by mouth every 7 (seven) days. Patient not taking:  Reported on 06/19/2023 05/06/23   Langston Reusing, MD  Zinc 30 MG CAPS Take 1 capsule by mouth daily in the afternoon.    [provider]      Allergies    Patient has no known allergies.    Review of Systems   Review of Systems  Constitutional:  Negative for activity change, appetite change and fever.  HENT:  Negative for congestion and rhinorrhea.   Respiratory:  Negative for cough, chest tightness and shortness of breath.   Gastrointestinal:  Negative for abdominal pain, nausea and vomiting.  Genitourinary:  Negative for dysuria and hematuria.  Musculoskeletal:  Positive for arthralgias and myalgias.  Skin:  Positive for rash.  Neurological:  Negative for dizziness, weakness and headaches.   all other systems are negative except as noted in the HPI and PMH.    Physical Exam Updated Vital Signs BP (!) 114/57   Pulse 85   Temp 99.1 F (37.3 C) (Oral)   Resp 20   Ht 5\' 9"  (1.753 m)   Wt (!) 156.5 kg   SpO2 95%   BMI 50.95 kg/m  Physical Exam Vitals and nursing note reviewed.  Constitutional:      General: He is not in acute distress.    Appearance: He is well-developed.     Comments: Uncomfortable appearing  HENT:     Head: Normocephalic and atraumatic.     Mouth/Throat:     Pharynx: No oropharyngeal exudate.  Eyes:     Conjunctiva/sclera: Conjunctivae normal.     Pupils: Pupils are equal, round, and reactive to light.  Neck:     Comments: No meningismus. Cardiovascular:     Rate and Rhythm: Normal rate and regular rhythm.     Heart sounds: Normal heart sounds. No murmur heard. Pulmonary:     Effort: Pulmonary effort is normal. No respiratory distress.     Breath sounds: Normal breath sounds.  Abdominal:     Palpations: Abdomen is soft.     Tenderness: There is no abdominal tenderness. There is no guarding or rebound.  Musculoskeletal:        General: No tenderness. Normal range of motion.     Cervical back: Normal range of motion and neck supple.   Skin:    General: Skin is warm.     Findings: Erythema and rash present.     Comments: Red itchy painful rash involving lower extremities diffusely, lower abdomen, low back.  Some areas are coalesced macular and papular what other regions seem to be more petechial.  Intact DP and PT pulses  Neurological:     Mental Status: He is alert and oriented to person, place, and time.     Cranial Nerves: No cranial nerve deficit.     Motor: No abnormal muscle tone.     Coordination: Coordination normal.  Comments:  5/5 strength throughout. CN 2-12 intact.Equal grip strength.   Psychiatric:        Behavior: Behavior normal.            ED Results / Procedures / Treatments   Labs (all labs ordered are listed, but only abnormal results are displayed) Labs Reviewed  COMPREHENSIVE METABOLIC PANEL - Abnormal; Notable for the following components:      Result Value   Sodium 134 (*)    Glucose, Bld 114 (*)    Creatinine, Ser 1.59 (*)    Total Bilirubin 1.4 (*)    GFR, Estimated 51 (*)    All other components within normal limits  CBC WITH DIFFERENTIAL/PLATELET - Abnormal; Notable for the following components:   Platelets 147 (*)    Eosinophils Absolute 1.0 (*)    All other components within normal limits  CULTURE, BLOOD (ROUTINE X 2)  CULTURE, BLOOD (ROUTINE X 2)  LACTIC ACID, PLASMA  LACTIC ACID, PLASMA  URINALYSIS, W/ REFLEX TO CULTURE (INFECTION SUSPECTED)  SEDIMENTATION RATE  C-REACTIVE PROTEIN  ANTINUCLEAR ANTIBODIES, IFA  ROCKY MTN SPOTTED FVR ABS PNL(IGG+IGM)  LYME DISEASE SEROLOGY W/REFLEX  CK  LIPASE, BLOOD    EKG None  Radiology DG Chest Port 1 View  Result Date: 09/15/2023 CLINICAL DATA:  Fever and chills EXAM: PORTABLE CHEST 1 VIEW COMPARISON:  Radiographs 05/20/2020 FINDINGS: The heart size and mediastinal contours are within normal limits. Both lungs are clear. The visualized skeletal structures are unremarkable. IMPRESSION: No active disease.  Electronically Signed   By: Minerva Fester M.D.   On: 09/15/2023 23:26    Procedures Procedures  {Document cardiac monitor, telemetry assessment procedure when appropriate:1}  Medications Ordered in ED Medications  lactated ringers bolus 1,000 mL (has no administration in time range)  fentaNYL (SUBLIMAZE) injection 50 mcg (has no administration in time range)  famotidine (PEPCID) IVPB 20 mg premix (has no administration in time range)  diphenhydrAMINE (BENADRYL) injection 25 mg (has no administration in time range)    ED Course/ Medical Decision Making/ A&P   {   Click here for ABCD2, HEART and other calculatorsREFRESH Note before signing :1}                              Medical Decision Making Amount and/or Complexity of Data Reviewed Labs: ordered. Decision-making details documented in ED Course. Radiology: ordered and independent interpretation performed. Decision-making details documented in ED Course. ECG/medicine tests: ordered and independent interpretation performed. Decision-making details documented in ED Course.  Risk Prescription drug management.   3 days of painful rash with fever.  No chest pain or shortness of breath.  No difficulty breathing or difficulty swallowing.  Will obtain lab work including blood cultures, [facility] spotted fever, Lyme titers, ESR, CRP, CK, ANA.  Will give IV fluids.  Platelet count acceptable at 147.  Creatinine 1.59. {Document critical care time when appropriate:1} {Document review of labs and clinical decision tools ie heart score, Chads2Vasc2 etc:1}  {Document your independent review of radiology images, and any outside records:1} {Document your discussion with family members, caretakers, and with consultants:1} {Document social determinants of health affecting pt's care:1} {Document your decision making why or why not admission, treatments were needed:1} Final Clinical Impression(s) / ED Diagnoses Final diagnoses:  None     Rx / DC Orders ED Discharge Orders     None

## 2023-09-15 NOTE — ED Triage Notes (Signed)
PT to triage c/o fever with rash to bilateral Lower Extremities red raised beginning spreading up the legs into torso. Rash started Friday. PT VSS NAD PT on room air. Pt took benadryl 50mg  @15 :00 no improvement.

## 2023-09-16 LAB — CK: Total CK: 118 U/L (ref 49–397)

## 2023-09-16 LAB — SEDIMENTATION RATE: Sed Rate: 26 mm/h — ABNORMAL HIGH (ref 0–16)

## 2023-09-16 LAB — LACTIC ACID, PLASMA: Lactic Acid, Venous: 1 mmol/L (ref 0.5–1.9)

## 2023-09-16 LAB — LIPASE, BLOOD: Lipase: 69 U/L — ABNORMAL HIGH (ref 11–51)

## 2023-09-16 LAB — C-REACTIVE PROTEIN: CRP: 8.7 mg/dL — ABNORMAL HIGH (ref <1.0)

## 2023-09-16 MED ORDER — ACETAMINOPHEN 325 MG PO TABS
650.0000 mg | ORAL_TABLET | Freq: Once | ORAL | Status: AC
  Administered 2023-09-16: 650 mg via ORAL
  Filled 2023-09-16: qty 2

## 2023-09-16 MED ORDER — METHYLPREDNISOLONE SODIUM SUCC 125 MG IJ SOLR
125.0000 mg | Freq: Once | INTRAMUSCULAR | Status: AC
  Administered 2023-09-16: 125 mg via INTRAVENOUS
  Filled 2023-09-16: qty 2

## 2023-09-16 NOTE — ED Notes (Signed)
Report called to [facility] at [facility].

## 2023-09-16 NOTE — ED Notes (Signed)
Patient left in care of [facility] en route to [facility].

## 2023-09-16 NOTE — ED Notes (Signed)
Pt [facility] accepting physician: Donzetta Sprung Bed: AT59 Report to : [phone]

## 2023-09-16 NOTE — ED Notes (Signed)
Report given to [facility]

## 2023-09-16 NOTE — ED Notes (Signed)
Called to make sure transport was being arranged Michael [name] said that it is in progress and will call when truck is ready and give eta

## 2023-09-17 LAB — ANTINUCLEAR ANTIBODIES, IFA: ANA Ab, IFA: POSITIVE — AB

## 2023-09-17 LAB — LYME DISEASE SEROLOGY W/REFLEX: Lyme Total Antibody EIA: NEGATIVE

## 2023-09-17 LAB — FANA STAINING PATTERNS: Speckled Pattern: 24529 — ABNORMAL HIGH

## 2023-09-18 ENCOUNTER — Telehealth: Payer: Self-pay

## 2023-09-18 NOTE — Transitions of Care (Post Inpatient/ED Visit) (Signed)
09/18/2023  Name: Michael [name] MRN: [id number] DOB: 10-09-1966  Today's TOC FU Call Status: Today's TOC FU Call Status:: Successful TOC FU Call Completed TOC FU Call Complete Date: 09/18/23 Patient's Name and Date of Birth confirmed.  Transition Care Management Follow-up Telephone Call Date of Discharge: 09/17/23 Discharge Facility: Other ([facility]) Name of Other ([facility]) Discharge Facility: [facility] Type of Discharge: Inpatient Admission Primary Inpatient Discharge Diagnosis:: "vasculitis" How have you been since you were released from the hospital?: Better (Pt states he is doing better- rash improving-took Prednisone this AM, appetite good) Any questions or concerns?: Yes Patient Questions/Concerns:: pt voices he is sitll not sleeping/resting well at night-does not feel like Neurontin helping- slept about 3hrs last night- using CPAP-plans to discuss with provider during appt other med options to help Patient Questions/Concerns Addressed: Notified Provider of Patient Questions/Concerns  Items Reviewed: Did you receive and understand the discharge instructions provided?: Yes Medications obtained,verified, and reconciled?: Yes (Medications Reviewed) Any new allergies since your discharge?: No Dietary orders reviewed?: Yes Type of Diet Ordered:: low salt/heart healthy Do you have support at home?: No  Medications Reviewed Today: Medications Reviewed Today     Reviewed by Charlyn Minerva, RN (Registered Nurse) on 09/18/23 at 1056  Med List Status: <None>   Medication Order Taking? Sig Documenting Provider Last Dose Status Informant  acitretin (SORIATANE) 25 MG capsule [id number] Yes Take 25 mg by mouth daily before breakfast. [provider] Taking Active   amoxicillin-clavulanate (AUGMENTIN) 875-125 MG tablet [id number] No Take 1 tablet by mouth 2 (two) times daily.  Patient not taking: Reported on 09/18/2023   Michael [name], Michael [name] Not Taking Active    ASPIRIN LOW DOSE 81 MG tablet [id number] Yes TAKE 1 TABLET BY MOUTH EVERY DAY Michael Decamp, MD Taking Active   atorvastatin (LIPITOR) 80 MG tablet [id number] Yes TAKE 1 TABLET BY MOUTH EVERYDAY AT BEDTIME Michael Decamp, MD Taking Active   bumetanide (BUMEX) 0.5 MG tablet [id number] No TAKE 1 TABLET BY MOUTH EVERY DAY  Patient not taking: Reported on 09/18/2023   Michael Decamp, MD Not Taking Active   COVID-19 Antibody Test KIT [id number] No Use per package instructions  Patient not taking: Reported on 09/18/2023   Michael [name], Michael [name] Not Taking Active   gabapentin (NEURONTIN) 300 MG capsule [id number] Yes Take 1 capsule (300 mg total) by mouth at bedtime. Michael Dark, MD Taking Active   metoprolol tartrate (LOPRESSOR) 25 MG tablet [id number] Yes TAKE 1 TABLET BY MOUTH TWICE A Michael Calamity, MD Taking Active   Multiple Vitamin (MULTIVITAMIN) capsule [id number] Yes Take 1 capsule by mouth daily. [provider] Taking Active Self  nitroGLYCERIN (NITROSTAT) 0.4 MG SL tablet [id number]  Place 1 tablet (0.4 mg total) under the tongue every 5 (five) minutes x 3 doses as needed for chest pain. Michael Hines, NP  Active   predniSONE (DELTASONE) 20 MG tablet [id number] Yes Take 20 mg by mouth daily with breakfast. Take 3 tablets (60 mg total) by mouth daily for 6 days, THEN 2.5 tablets (50 mg total) daily for 2 days, THEN 2 tablets (40 mg total) daily for 2 days, THEN 1.5 tablets (30 mg total) daily for 2 days, THEN 1 tablet (20 mg total) daily for 2 days, THEN 0.5 tablets (10 mg total) daily for 2 days. [provider] Taking Active Self  sildenafil (VIAGRA) 100 MG tablet [id number] Yes TAKE 1 TABLET(100 MG) BY MOUTH DAILY AS NEEDED FOR ERECTILE DYSFUNCTION Michael Decamp, MD  Taking Active   testosterone cypionate (DEPOTESTOSTERONE CYPIONATE) 200 MG/ML injection [id number] No Inject 1 mL (200 mg total) into the muscle every 14 (fourteen) days.  Patient not taking: Reported on 06/19/2023    Michael Dark, MD Not Taking Active   tirzepatide [name]) 7.5 MG/0.5ML Pen [id number] Yes Inject 7.5 mg into the skin once a week. Michael Decamp, MD Taking Active   traZODone (DESYREL) 100 MG tablet [id number] No Take 100 mg by mouth at bedtime.  Patient not taking: Reported on 09/18/2023   [provider] Not Taking Active   triamcinolone cream (KENALOG) 0.1 % [id number] Yes Apply 1 Application topically 2 (two) times daily. Apply topically 2 (two) times a day for up to 14 days. [provider] Taking Active Self  triamterene-hydrochlorothiazide (MAXZIDE-25) 37.5-25 MG tablet [id number] Yes TAKE 1 TABLET BY MOUTH EVERY DAY IN THE MORNING  Patient taking differently: Pt states taking every other day   Michael Decamp, MD Taking Active Self  valsartan (DIOVAN) 160 MG tablet [id number] Yes TAKE 1 TABLET BY MOUTH EVERY DAY Michael Decamp, MD Taking Active   Vitamin D, Ergocalciferol, (DRISDOL) 1.25 MG (50000 UNIT) CAPS capsule [id number] No Take 1 capsule (50,000 Units total) by mouth every 7 (seven) days.  Patient not taking: Reported on 06/19/2023   Michael [name] Reusing, MD Not Taking Active   Zinc 30 MG CAPS [id number] No Take 1 capsule by mouth daily in the afternoon.  Patient not taking: Reported on 09/18/2023   [provider] Not Taking Active             Home Care and Equipment/Supplies: Were Home Health Services Ordered?: NA Any new equipment or medical supplies ordered?: NA  Functional Questionnaire: Do you need assistance with bathing/showering or dressing?: No Do you need assistance with meal preparation?: No Do you need assistance with eating?: No Do you have difficulty maintaining continence: No Do you need assistance with getting out of bed/getting out of a chair/moving?: No Do you have difficulty managing or taking your medications?: No  Follow up appointments reviewed: PCP Follow-up appointment confirmed?: Yes Date of PCP follow-up appointment?: 10/04/23  (care guide assisted with making hosp f/u appt during call per pt request of wanting to 2pm appt time slot) Follow-up Provider: Dr. [name] [facility] Follow-up appointment confirmed?: No Reason Specialist Follow-Up Not Confirmed: Patient has Specialist Provider Number and will Call for Appointment (pt aware to follow up with dermatology as advised on d/c instructions and has office number) Do you need transportation to your follow-up appointment?: No Do you understand care options if your condition(s) worsen?: Yes-patient verbalized understanding  SDOH Interventions Today    Flowsheet Row Most Recent Value  SDOH Interventions   Food Insecurity Interventions Intervention Not Indicated  Transportation Interventions Intervention Not Indicated       Antionette Fairy, RN,BSN,CCM RN Care Manager Transitions of Care  [place]  Direct Phone: [phone] Toll Free: [phone] Fax: [phone]

## 2023-09-21 LAB — CULTURE, BLOOD (ROUTINE X 2)
Culture: NO GROWTH
Culture: NO GROWTH
Special Requests: ADEQUATE

## 2023-10-04 ENCOUNTER — Inpatient Hospital Stay: Payer: 59 | Admitting: Family Medicine

## 2023-10-08 ENCOUNTER — Other Ambulatory Visit: Payer: Self-pay

## 2023-10-08 DIAGNOSIS — I10 Essential (primary) hypertension: Secondary | ICD-10-CM

## 2023-10-08 MED ORDER — BUMETANIDE 0.5 MG PO TABS: 0.5000 mg | ORAL_TABLET | Freq: Every day | ORAL | 7 refills | Status: DC

## 2023-10-11 ENCOUNTER — Encounter: Payer: Self-pay | Admitting: Family Medicine

## 2023-10-11 ENCOUNTER — Encounter: Payer: 59 | Admitting: Family Medicine

## 2023-10-11 ENCOUNTER — Ambulatory Visit: Payer: 59 | Admitting: Family Medicine

## 2023-10-11 VITALS — BP 96/62 | HR 65 | Temp 97.0°F | Ht 69.0 in | Wt 344.8 lb

## 2023-10-11 DIAGNOSIS — R768 Other specified abnormal immunological findings in serum: Secondary | ICD-10-CM | POA: Diagnosis not present

## 2023-10-11 DIAGNOSIS — G4700 Insomnia, unspecified: Secondary | ICD-10-CM

## 2023-10-11 DIAGNOSIS — M109 Gout, unspecified: Secondary | ICD-10-CM | POA: Diagnosis not present

## 2023-10-11 DIAGNOSIS — M310 Hypersensitivity angiitis: Secondary | ICD-10-CM | POA: Diagnosis not present

## 2023-10-11 DIAGNOSIS — E538 Deficiency of other specified B group vitamins: Secondary | ICD-10-CM | POA: Insufficient documentation

## 2023-10-11 DIAGNOSIS — E785 Hyperlipidemia, unspecified: Secondary | ICD-10-CM

## 2023-10-11 DIAGNOSIS — L301 Dyshidrosis [pompholyx]: Secondary | ICD-10-CM

## 2023-10-11 MED ORDER — VITAMIN B-12 1000 MCG PO TABS: 1000.0000 ug | ORAL_TABLET | Freq: Every day | ORAL | 3 refills | Status: AC

## 2023-10-11 NOTE — Assessment & Plan Note (Signed)
Follows with dermatology

## 2023-10-11 NOTE — Assessment & Plan Note (Signed)
His rash has resolved.  He has 1 more day of prednisone left.  Had extensive discussion with patient today regarding his workup and [facility].  Apparently the dermatologist told him that he does not need any dermatologic follow-up with his rash is improved.  On reviewing his workup in the hospital he was found to have a positive ANA of 1-320 titer.  He has had intermittent issues with rash and joint pain in the past they were typically attributed to eczema, dyshidrosis, gout, and osteoarthritis.  It is possible that he may have some underlying autoimmune condition.  Given his recent episode and strongly positive ANA titer would be reasonable for him to see rheumatology for further evaluation.  Will place referral today.  He will let us know if he has any recurrence of rash though anticipate that he will do well going forward.

## 2023-10-11 NOTE — Assessment & Plan Note (Signed)
BMI 50.9.  He has tried working with nutritionist however ended up getting weight with a diet and prescribed.  His cardiologist has him on Zepbound 7.5 mg daily but he is not sure if this is effective.  He will come back soon for physical and we can discuss more at that point they would be reasonable for him to increase the dose as tolerated.

## 2023-10-11 NOTE — Assessment & Plan Note (Addendum)
Symptoms are still not controlled.  He is not sure if the gabapentin is helping.  He has been on hydroxyzine or trazodone in the past without much improvement.  Also tried Ambien in the past but did not tolerate due to next day grogginess.  He is interested in trial of alternative medication.  He will discontinue gabapentin and he will follow-up with [place] in a couple of weeks.  If still having issues with insomnia at that point would consider trial of ramelteon or Belsomra. Will avoid mirtazapine for now due to obesity.

## 2023-10-11 NOTE — Assessment & Plan Note (Signed)
Patient with presumed gout due to recurrent toe pain.  He did have uric acid level checked last year due to an acute flare which was 6.5 by his cardiologist.  He has not had any levels checked outside of an acute flare.  He did recently have a flareup of pain in his left toe and took a single dose of colchicine and pain is resolving.  Discussed with patient that we need to check uric acid when he is not dealing with an acute flare.  We can check this when he comes back in a few weeks for CPE.  He can also discuss this with rheumatology.

## 2023-10-11 NOTE — Progress Notes (Signed)
Chief Complaint:  Michael [name] is a 57 y.o. male who presents today for a TCM visit.  Assessment/Plan:  Chronic Problems Addressed Today: Leukocytoclastic vasculitis (HCC) His rash has resolved.  He has 1 more day of prednisone left.  Had extensive discussion with patient today regarding his workup and atrium.  Apparently the dermatologist told him that he does not need any dermatologic follow-up with his rash is improved.  On reviewing his workup in the hospital he was found to have a positive ANA of 1-320 titer.  He has had intermittent issues with rash and joint pain in the past they were typically attributed to eczema, dyshidrosis, gout, and osteoarthritis.  It is possible that he may have some underlying autoimmune condition.  Given his recent episode and strongly positive ANA titer would be reasonable for him to see rheumatology for further evaluation.  Will place referral today.  He will let us know if he has any recurrence of rash though anticipate that he will do well going forward.  Obesity, morbid (HCC) BMI 50.9.  He has tried working with nutritionist however ended up getting weight with a diet and prescribed.  His cardiologist has him on Zepbound 7.5 mg daily but he is not sure if this is effective.  He will come back soon for physical and we can discuss more at that point they would be reasonable for him to increase the dose as tolerated.  Gout Patient with presumed gout due to recurrent toe pain.  He did have uric acid level checked last year due to an acute flare which was 6.5 by his cardiologist.  He has not had any levels checked outside of an acute flare.  He did recently have a flareup of pain in his left toe and took a single dose of colchicine and pain is resolving.  Discussed with patient that we need to check uric acid when he is not dealing with an acute flare.  We can check this when he comes back in a few weeks for CPE.  He can also discuss this with  rheumatology.  Hyperlipidemia LDL goal <70 On Lipitor 80 mg daily.  Tolerating well.  He will come back soon for physical and we will check lipids at that time.  Dyshidrosis Follows with dermatology.  Insomnia Symptoms are still not controlled.  He is not sure if the gabapentin is helping.  He has been on hydroxyzine or trazodone in the past without much improvement.  Also tried Ambien in the past but did not tolerate due to next day grogginess.  He is interested in trial of alternative medication.  He will discontinue gabapentin and he will follow-up with [place] in a couple of weeks.  If still having issues with insomnia at that point would consider trial of ramelteon or Belsomra. Will avoid mirtazapine for now due to obesity.   Low serum vitamin B12 His weight management physician was previously prescribing this.  Will send in prescription today.  Will check B12 and comes back in for physical in a few weeks.     Subjective:  HPI:  Summary of Hospital admission: Reason for admission: Rash Date of admission: 09/15/2023 Date of discharge: 09/17/2023 Date of Interactive contact: 09/18/2023 Summary of Hospital course: Patient presented to ED on 09/15/2023 with 3 days of rash and fever.  Initially rash started on bilateral lower extremities.  This progressed to lower abdomen and back.  Had a fever at home with 103.  He had extensive workup  in the ED including RMSF, Lyme disease titers, sed rate, CRP, CK, and ANA.  He was initially started on doxycycline, IV Rocephin, and IV vancomycin as well as oral steroids.  There was concern for possible vasculitis based on his presentation and labs including elevated creatinine and nonblanchable rash.  He was then transferred to [facility] for dermatologic evaluation.  Their dermatologist was consulted who felt his presentation was consistent with leukocytoclastic vasculitis.  He was continued on oral prednisone 60 mg daily for 7 days total to be  followed by 10-day taper.  He is also continued on topical triamcinolone.  He was discharged home on hospital day 1.  Interim history:  He has done well for the last few weeks.  He still has 1 more day of prednisone.  Rash resolved within a couple of days.  He has not had any side effects with the prednisone.  Overall feels well today.  Feels like he is back to his baseline.  He does have several questions and concerns regarding his workup during his hospitalization.   ROS: Per HPI, otherwise a complete review of systems was negative.   PMH:  The following were reviewed and entered/updated in epic: Past Medical History:  Diagnosis Date   (HFpEF) heart failure with preserved ejection fraction (HCC)    a. 05/2020 Echo: EF 60%, no rwma, Gr2 DD. Triv MR.   Back pain    CAD (coronary artery disease)    a. 04/2020 NSTEMI/PCI: LM nl, LAD nl, LCX nl, RCA 46m, 85d (3.6x26 Resolute Onyx DES), RPDA nl, RPAV nl, EF 50-55%.   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 06/13/2023   Gout 10/05/2022   Low testosterone 08/16/2020   CAD (coronary artery disease) s/p NSTEMI 2021 05/21/2020   (HFpEF) heart failure with preserved ejection fraction (HCC) 05/21/2020   Dyshidrosis 08/31/2019   Insomnia 08/31/2019   Hyperlipidemia LDL goal <70 08/28/2018   Erectile dysfunction 08/28/2018    Primary hypertension 08/28/2018   Obesity, morbid (HCC) 02/24/2014   OSA treated with BiPAP 08/27/2013   Nocturia 08/27/2013   Obesity hypoventilation syndrome (HCC)    Past Surgical History:  Procedure Laterality Date   CARDIAC CATHETERIZATION     CORONARY STENT INTERVENTION N/A 05/20/2020   Procedure: CORONARY STENT INTERVENTION;  Surgeon: Yvonne Kendall, MD;  Location: [facility];  Service: Cardiovascular;  Laterality: N/A;   LEFT HEART CATH AND CORONARY ANGIOGRAPHY N/A 05/20/2020   Procedure: LEFT HEART CATH AND CORONARY ANGIOGRAPHY;  Surgeon: Yvonne Kendall, MD;  Location: [facility] CV [facility];  Service: Cardiovascular;  Laterality: N/A;    Family History  Problem Relation Age of Onset   Alzheimer's disease Mother     Medications- Reconciled discharge and current medications in Epic.  Current Outpatient Medications  Medication Sig Dispense Refill   acitretin (SORIATANE) 25  MG capsule Take 25 mg by mouth daily before breakfast.     ASPIRIN LOW DOSE 81 MG tablet TAKE 1 TABLET BY MOUTH EVERY DAY 30 tablet 8   atorvastatin (LIPITOR) 80 MG tablet TAKE 1 TABLET BY MOUTH EVERYDAY AT BEDTIME 30 tablet 8   bumetanide (BUMEX) 0.5 MG tablet Take 1 tablet (0.5 mg total) by mouth daily. 30 tablet 7   cyanocobalamin (VITAMIN B12) 1000 MCG tablet Take 1 tablet (1,000 mcg total) by mouth daily. 90 tablet 3   metoprolol tartrate (LOPRESSOR) 25 MG tablet TAKE 1 TABLET BY MOUTH TWICE A DAY 60 tablet 5   Multiple Vitamin (MULTIVITAMIN) capsule Take 1 capsule by mouth daily.     nitroGLYCERIN (NITROSTAT) 0.4 MG SL tablet Place 1 tablet (0.4 mg total) under the tongue every 5 (five) minutes x 3 doses as needed for chest pain. 25 tablet 3   predniSONE (DELTASONE) 20 MG tablet Take 20 mg by mouth daily with breakfast. Take 3 tablets (60 mg total) by mouth daily for 6 days, THEN 2.5 tablets (50 mg total) daily for 2 days, THEN 2 tablets (40 mg total) daily for 2 days, THEN 1.5 tablets (30 mg total)  daily for 2 days, THEN 1 tablet (20 mg total) daily for 2 days, THEN 0.5 tablets (10 mg total) daily for 2 days.     sildenafil (VIAGRA) 100 MG tablet TAKE 1 TABLET(100 MG) BY MOUTH DAILY AS NEEDED FOR ERECTILE DYSFUNCTION 30 tablet 1   testosterone cypionate (DEPOTESTOSTERONE CYPIONATE) 200 MG/ML injection Inject 1 mL (200 mg total) into the muscle every 14 (fourteen) days. 7 mL 1   tirzepatide (ZEPBOUND) 7.5 MG/0.5ML Pen Inject 7.5 mg into the skin once a week. 2 mL 2   traZODone (DESYREL) 100 MG tablet Take 100 mg by mouth at bedtime.     triamcinolone cream (KENALOG) 0.1 % Apply 1 Application topically 2 (two) times daily. Apply topically 2 (two) times a day for up to 14 days.     triamterene-hydrochlorothiazide (MAXZIDE-25) 37.5-25 MG tablet TAKE 1 TABLET BY MOUTH EVERY DAY IN THE MORNING (Patient taking differently: Pt states taking every other day) 30 tablet 2   valsartan (DIOVAN) 160 MG tablet TAKE 1 TABLET BY MOUTH EVERY DAY 30 tablet 11   Zinc 30 MG CAPS Take 1 capsule by mouth daily in the afternoon.     No current facility-administered medications for this visit.    Allergies-reviewed and updated No Known Allergies  Social History   Socioeconomic History   Marital status: Divorced    Spouse name: Not on file   Number of children: 1   Years of education: 14   Highest education level: Not on file  Occupational History    Employer: [employer]   Occupation: Customer service manager  Tobacco Use   Smoking status: Never   Smokeless tobacco: Never  Vaping Use   Vaping status: Never Used  Substance and Sexual Activity   Alcohol use: Yes    Comment: rarely   Drug use: No   Sexual activity: Not on file  Other Topics Concern   Not on file  Social History Narrative   Patient is single and lives alone.   Patient is working full-time.   Patient has a college education.   Patient is right-handed.   Patient drinks 3-4 cups of coffee daily.   Social Determinants of Health   Financial  Resource Strain: Not on file  Food Insecurity: No Food Insecurity (09/18/2023)   Hunger  Vital Sign    Worried About Programme researcher, broadcasting/film/video in the Last Year: Never true    Ran Out of Food in the Last Year: Never true  Transportation Needs: No Transportation Needs (09/18/2023)   PRAPARE - Administrator, Civil Service (Medical): No    Lack of Transportation (Non-Medical): No  Physical Activity: Not on file  Stress: Not on file  Social Connections: Not on file        Objective:  Physical Exam: BP 96/62   Pulse 65   Temp (!) 97 F (36.1 C) (Temporal)   Ht 5\' 9"  (1.753 m)   Wt (!) 344 lb 12.8 oz (156.4 kg)   SpO2 96%   BMI 50.92 kg/m   Gen: NAD, resting comfortably MSK: No edema, cyanosis, or clubbing noted Skin: Warm, dry.  Resolving hypegrpigmented rash on lower extremities. Neuro: Grossly normal, moves all extremities Psych: Normal affect and thought content  Time Spent: 50 minutes of total time was spent on the date of the encounter performing the following actions: chart review prior to seeing the patient including recent ED visit and hospitalization, obtaining history, performing a medically necessary exam, counseling on the treatment plan, placing orders, and documenting in our EHR.         Katina Degree. [name], MD 10/11/2023 2:35 PM

## 2023-10-11 NOTE — Patient Instructions (Signed)
It was very nice to see you today!  I am glad that you are feeling better.  I will refill your B12.  No other medication changes today.  Your kidney function was back to normal at the time of you leaving the hospital.  We will refer you to see the rheumatologist.  I will see back in a couple of weeks for your physical.  Return for Annual Physical.   Take care, Dr [name]  PLEASE NOTE:  If you had any lab tests, please let us know if you have not heard back within a few days. You may see your results on mychart before we have a chance to review them but we will give you a call once they are reviewed by [place].   If we ordered any referrals today, please let us know if you have not heard from their office within the next week.   If you had any urgent prescriptions sent in today, please check with the pharmacy within an hour of our visit to make sure the prescription was transmitted appropriately.   Please try these tips to maintain a healthy lifestyle:  Eat at least 3 REAL meals and 1-2 snacks per day.  Aim for no more than 5 hours between eating.  If you eat breakfast, please do so within one hour of getting up.   Each meal should contain half fruits/vegetables, one quarter protein, and one quarter carbs (no bigger than a computer mouse)  Cut down on sweet beverages. This includes juice, soda, and sweet tea.   Drink at least 1 glass of water with each meal and aim for at least 8 glasses per day  Exercise at least 150 minutes every week.

## 2023-10-11 NOTE — Assessment & Plan Note (Signed)
His weight management physician was previously prescribing this.  Will send in prescription today.  Will check B12 and comes back in for physical in a few weeks.

## 2023-10-11 NOTE — Assessment & Plan Note (Signed)
On Lipitor 80 mg daily.  Tolerating well.  He will come back soon for physical and we will check lipids at that time.

## 2023-11-01 ENCOUNTER — Encounter: Payer: Self-pay | Admitting: Family Medicine

## 2023-11-01 ENCOUNTER — Ambulatory Visit (INDEPENDENT_AMBULATORY_CARE_PROVIDER_SITE_OTHER): Payer: 59 | Admitting: Family Medicine

## 2023-11-01 VITALS — BP 123/67 | HR 64 | Temp 98.0°F | Ht 69.0 in | Wt 353.4 lb

## 2023-11-01 DIAGNOSIS — M109 Gout, unspecified: Secondary | ICD-10-CM

## 2023-11-01 DIAGNOSIS — R0609 Other forms of dyspnea: Secondary | ICD-10-CM | POA: Diagnosis not present

## 2023-11-01 DIAGNOSIS — G4700 Insomnia, unspecified: Secondary | ICD-10-CM

## 2023-11-01 DIAGNOSIS — M79671 Pain in right foot: Secondary | ICD-10-CM

## 2023-11-01 DIAGNOSIS — I10 Essential (primary) hypertension: Secondary | ICD-10-CM

## 2023-11-01 DIAGNOSIS — E538 Deficiency of other specified B group vitamins: Secondary | ICD-10-CM

## 2023-11-01 DIAGNOSIS — E785 Hyperlipidemia, unspecified: Secondary | ICD-10-CM

## 2023-11-01 DIAGNOSIS — Z0001 Encounter for general adult medical examination with abnormal findings: Secondary | ICD-10-CM

## 2023-11-01 DIAGNOSIS — Z125 Encounter for screening for malignant neoplasm of prostate: Secondary | ICD-10-CM

## 2023-11-01 MED ORDER — BELSOMRA 10 MG PO TABS: 10.0000 mg | ORAL_TABLET | Freq: Every evening | ORAL | 5 refills | Status: DC | PRN

## 2023-11-01 NOTE — Assessment & Plan Note (Signed)
BMI today is 52.  We discussed lifestyle modifications.  Check labs.

## 2023-11-01 NOTE — Assessment & Plan Note (Signed)
Check B12 

## 2023-11-01 NOTE — Assessment & Plan Note (Signed)
Check uric acid.  Not currently on any urate lowering medications.

## 2023-11-01 NOTE — Assessment & Plan Note (Signed)
Much improvement with the gabapentin.  Has also tried other medications in the past including hydroxyzine, trazodone, and Ambien.  We discussed alternative treatment options.  We will try Belsomra.  He will follow-up with [place] in a few weeks to let us know how is doing.  If no improvement with this would consider trial of ramelteon for Lunesta.

## 2023-11-01 NOTE — Assessment & Plan Note (Signed)
Blood pressure at all today on valsartan 160 mg daily and metoprolol tartrate 25 mg twice daily.

## 2023-11-01 NOTE — Progress Notes (Signed)
Chief Complaint:  Michael [name] is a 57 y.o. male who presents today for his annual comprehensive physical exam.    Assessment/Plan:  New/Acute Problems: Dyspnea on Exertion  Currently no symptoms.  Broad differential especially in light of recent hospital admission for leukocytoclastic vasculitis.  Will check labs today.  Little utility for D-dimer today given his recent hospitalization.  We will check CTA to rule out PE.  Hopefully this will also let us evaluate lung parenchyma as well.  He is not having any significant worsening lower extreme edema orthopnea-do not think that this represents acute CHF however, CTA as above should evaluate for pulmonary edema.  It is also possible this may represent an anginal equivalent.  If his above CTA and labs are negative we will have him follow back up with cardiology for further cardiac testing.  Given that symptoms have been going on for the last 6 weeks do not think he needs to go to the emergency room at this point however we did discuss strict reasons to return to care and seek emergent care.  Right foot pain Not currently having any pain.  Overall reassuring exam.  Based on history may have had a ganglion cyst that is since resolved.  He likely does have some underlying autoimmune condition contributing as well.  Will place referral to sports medicine for further evaluation.  Is not  Chronic Problems Addressed Today: Insomnia Much improvement with the gabapentin.  Has also tried other medications in the past including hydroxyzine, trazodone, and Ambien.  We discussed alternative treatment options.  We will try Belsomra.  He will follow-up with [place] in a few weeks to let us know how is doing.  If no improvement with this would consider trial of ramelteon for Lunesta.  Obesity, morbid (HCC) BMI today is 52.  We discussed lifestyle modifications.  Check labs.  Hyperlipidemia LDL goal <70 On Lipitor 80 mg daily.  Check labs.  Primary  hypertension Blood pressure at all today on valsartan 160 mg daily and metoprolol tartrate 25 mg twice daily.  Gout Check uric acid.  Not currently on any urate lowering medications.  Low serum vitamin B12 Check B12.  Preventative Healthcare: Check labs.  Up-to-date on colon cancer screening.  Up-to-date on flu vaccine.  Patient Counseling(The following topics were reviewed and/or handout was given):  -Nutrition: Stressed importance of moderation in sodium/caffeine intake, saturated fat and cholesterol, caloric balance, sufficient intake of fresh fruits, vegetables, and fiber.  -Stressed the importance of regular exercise.   -Substance Abuse: Discussed cessation/primary prevention of tobacco, alcohol, or other drug use; driving or other dangerous activities under the influence; availability of treatment for abuse.   -Injury prevention: Discussed safety belts, safety helmets, smoke detector, smoking near bedding or upholstery.   -Sexuality: Discussed sexually transmitted diseases, partner selection, use of condoms, avoidance of unintended pregnancy and contraceptive alternatives.   -Dental health: Discussed importance of regular tooth brushing, flossing, and dental visits.  -Health maintenance and immunizations reviewed. Please refer to Health maintenance section.  Return to care in 1 year for next preventative visit.     Subjective:  HPI:  Patient here today for annual physical.  With last saw him a few weeks ago for hospital follow-up for recent hospitalization for leukocytoclastic vasculitis.  He was referred to rheumatology at that time due to positive ANA of 1-320.  He would not be able to see them until next year.  His rash has resolved.  His primary concern today is  progressive dyspnea on exertion.  This has been going on for last 4 to 6 weeks ever since his episode of leukocyte of vasculitis and hospitalization.  Symptoms do seem to be getting worse.  Does not have any chest pain at  rest.  No shortness of breath.  No wheezing.  No nausea or vomiting.  No recent illnesses.  He has had low bit more cough and phlegm production.  He has also had more weight gain as well.  Also has been having ongoing issues with right foot pain.  This been on for the last 9 months or so.  Will occasionally feel a protruding mass but he is able to move around.  This will usually resolve within a few days.  No obvious injuries up to his main events..  Lifestyle Diet: Balanced. Plenty of fruits and vegetables.  Exercise: Limited recently due to dyspnea on exertion.      10/11/2023    1:51 PM  Depression screen PHQ 2/9  Decreased Interest 0  Down, Depressed, Hopeless 0  PHQ - 2 Score 0    There are no preventive care reminders to display for this patient.   ROS: Per HPI, otherwise a complete review of systems was negative.   PMH:  The following were reviewed and entered/updated in epic: Past Medical History:  Diagnosis Date   (HFpEF) heart failure with preserved ejection fraction (HCC)    a. 05/2020 Echo: EF 60%, no rwma, Gr2 DD. Triv MR.   Back pain    CAD (coronary artery disease)    a. 04/2020 NSTEMI/PCI: LM nl, LAD nl, LCX nl, RCA 17m, 85d (3.6x26 Resolute Onyx DES), RPDA nl, RPAV nl, EF 50-55%.   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 06/13/2023   Gout  10/05/2022   Low testosterone 08/16/2020   CAD (coronary artery disease) s/p NSTEMI 2021 05/21/2020   (HFpEF) heart failure with preserved ejection fraction (HCC) 05/21/2020   Dyshidrosis 08/31/2019   Insomnia 08/31/2019   Hyperlipidemia LDL goal <70 08/28/2018   Erectile dysfunction 08/28/2018   Primary hypertension 08/28/2018   Obesity, morbid (HCC) 02/24/2014   OSA treated with BiPAP 08/27/2013   Nocturia 08/27/2013   Obesity hypoventilation syndrome (HCC)    Past Surgical History:  Procedure Laterality Date   CARDIAC CATHETERIZATION     CORONARY STENT INTERVENTION N/A 05/20/2020   Procedure: CORONARY STENT INTERVENTION;  Surgeon: Yvonne Kendall, MD;  Location: [facility];  Service: Cardiovascular;  Laterality: N/A;   LEFT HEART CATH AND CORONARY ANGIOGRAPHY N/A 05/20/2020   Procedure: LEFT HEART CATH AND CORONARY ANGIOGRAPHY;  Surgeon: Yvonne Kendall, MD;  Location: [facility];  Service: Cardiovascular;  Laterality: N/A;    Family History  Problem Relation Age of Onset   Alzheimer's disease Mother     Medications- reviewed and updated Current Outpatient  Medications  Medication Sig Dispense Refill   acitretin (SORIATANE) 25 MG capsule Take 25 mg by mouth daily before breakfast.     ASPIRIN LOW DOSE 81 MG tablet TAKE 1 TABLET BY MOUTH EVERY DAY 30 tablet 8   atorvastatin (LIPITOR) 80 MG tablet TAKE 1 TABLET BY MOUTH EVERYDAY AT BEDTIME 30 tablet 8   bumetanide (BUMEX) 0.5 MG tablet Take 1 tablet (0.5 mg total) by mouth daily. 30 tablet 7   cyanocobalamin (VITAMIN B12) 1000 MCG tablet Take 1 tablet (1,000 mcg total) by mouth daily. 90 tablet 3   metoprolol tartrate (LOPRESSOR) 25 MG tablet TAKE 1 TABLET BY MOUTH TWICE A DAY 60 tablet 5   Multiple Vitamin (MULTIVITAMIN) capsule Take 1 capsule by mouth daily.     nitroGLYCERIN (NITROSTAT) 0.4 MG SL tablet Place 1 tablet (0.4 mg total) under the tongue every 5 (five) minutes x 3 doses as needed for chest pain. 25  tablet 3   sildenafil (VIAGRA) 100 MG tablet TAKE 1 TABLET(100 MG) BY MOUTH DAILY AS NEEDED FOR ERECTILE DYSFUNCTION 30 tablet 1   Suvorexant (BELSOMRA) 10 MG TABS Take 1 tablet (10 mg total) by mouth at bedtime as needed. 30 tablet 5   tirzepatide (ZEPBOUND) 7.5 MG/0.5ML Pen Inject 7.5 mg into the skin once a week. 2 mL 2   triamterene-hydrochlorothiazide (MAXZIDE-25) 37.5-25 MG tablet TAKE 1 TABLET BY MOUTH EVERY DAY IN THE MORNING (Patient taking differently: No sig reported) 30 tablet 2   valsartan (DIOVAN) 160 MG tablet TAKE 1 TABLET BY MOUTH EVERY DAY 30 tablet 11   predniSONE (DELTASONE) 20 MG tablet Take 20 mg by mouth daily with breakfast. Take 3 tablets (60 mg total) by mouth daily for 6 days, THEN 2.5 tablets (50 mg total) daily for 2 days, THEN 2 tablets (40 mg total) daily for 2 days, THEN 1.5 tablets (30 mg total) daily for 2 days, THEN 1 tablet (20 mg total) daily for 2 days, THEN 0.5 tablets (10 mg total) daily for 2 days. (Patient not taking: Reported on 11/01/2023)     testosterone cypionate (DEPOTESTOSTERONE CYPIONATE) 200 MG/ML injection Inject 1 mL (200 mg total) into the muscle every 14 (fourteen) days. (Patient not taking: Reported on 11/01/2023) 7 mL 1   triamcinolone cream (KENALOG) 0.1 % Apply 1 Application topically 2 (two) times daily. Apply topically 2 (two) times a day for up to 14 days. (Patient not taking: Reported on 11/01/2023)     Zinc 30 MG CAPS Take 1 capsule by mouth daily in the afternoon. (Patient not taking: Reported on 11/01/2023)     No current facility-administered medications for this visit.    Allergies-reviewed and updated No Known Allergies  Social History   Socioeconomic History   Marital status: Divorced    Spouse name: Not on file   Number of children: 1   Years of education: 14   Highest education level: Not on file  Occupational History    Employer: [employer]   Occupation: Customer service manager  Tobacco Use   Smoking status: Never   Smokeless  tobacco: Never  Vaping Use   Vaping status: Never Used  Substance and Sexual Activity   Alcohol use: Yes    Comment: rarely   Drug use: No   Sexual activity: Not on file  Other Topics Concern   Not on file  Social History Narrative   Patient is single and lives alone.   Patient is working full-time.   Patient has a college education.  Patient is right-handed.   Patient drinks 3-4 cups of coffee daily.   Social Drivers of Corporate investment banker Strain: Not on file  Food Insecurity: No Food Insecurity (09/18/2023)   Hunger Vital Sign    Worried About Running Out of Food in the Last Year: Never true    Ran Out of Food in the Last Year: Never true  Transportation Needs: No Transportation Needs (09/18/2023)   PRAPARE - Administrator, Civil Service (Medical): No    Lack of Transportation (Non-Medical): No  Physical Activity: Not on file  Stress: Not on file  Social Connections: Not on file        Objective:  Physical Exam: BP 123/67   Pulse 64   Temp 98 F (36.7 C)   Ht 5\' 9"  (1.753 m)   Wt (!) 353 lb 6.4 oz (160.3 kg)   SpO2 96%   BMI 52.19 kg/m   Body mass index is 52.19 kg/m. Wt Readings from Last 3 Encounters:  11/01/23 (!) 353 lb 6.4 oz (160.3 kg)  10/11/23 (!) 344 lb 12.8 oz (156.4 kg)  09/15/23 (!) 345 lb (156.5 kg)   Gen: NAD, resting comfortably HEENT: TMs normal bilaterally. OP clear. No thyromegaly noted.  CV: RRR with no murmurs appreciated Pulm: NWOB, CTAB with no crackles, wheezes, or rhonchi GI: Normal bowel sounds present. Soft, Nontender, Nondistended. MSK: no edema, cyanosis, or clubbing noted Skin: warm, dry Neuro: CN2-12 grossly intact. Strength 5/5 in upper and lower extremities. Reflexes symmetric and intact bilaterally.  Psych: Normal affect and thought content     Karlynn Furrow M. [name], MD 11/01/2023 2:36 PM

## 2023-11-01 NOTE — Assessment & Plan Note (Signed)
On Lipitor 80 mg daily.  Check labs. °

## 2023-11-01 NOTE — Patient Instructions (Addendum)
It was very nice to see you today!  We will order a CT scan to evaluate your shortness of breath.  Also check labs.  If you have any worsening symptoms please go straight to the ED.  Please try the Belsomra to help you the sleep.  I will refer you to sports medicine for your feet.  Please continue to work on diet and exercise.  Will check labs today for your A1c and cholesterol.  Return in about 1 year (around 10/31/2024) for Annual Physical.   Take care, Dr [name]  PLEASE NOTE:  If you had any lab tests, please let us know if you have not heard back within a few days. You may see your results on mychart before we have a chance to review them but we will give you a call once they are reviewed by [place].   If we ordered any referrals today, please let us know if you have not heard from their office within the next week.   If you had any urgent prescriptions sent in today, please check with the pharmacy within an hour of our visit to make sure the prescription was transmitted appropriately.   Please try these tips to maintain a healthy lifestyle:  Eat at least 3 REAL meals and 1-2 snacks per day.  Aim for no more than 5 hours between eating.  If you eat breakfast, please do so within one hour of getting up.   Each meal should contain half fruits/vegetables, one quarter protein, and one quarter carbs (no bigger than a computer mouse)  Cut down on sweet beverages. This includes juice, soda, and sweet tea.   Drink at least 1 glass of water with each meal and aim for at least 8 glasses per day  Exercise at least 150 minutes every week.    Preventive Care 19-55 Years Old, Male Preventive care refers to lifestyle choices and visits with your health care provider that can promote health and wellness. Preventive care visits are also called wellness exams. What can I expect for my preventive care visit? Counseling During your preventive care visit, your health care provider may ask about  your: Medical history, including: Past medical problems. Family medical history. Current health, including: Emotional well-being. Home life and relationship well-being. Sexual activity. Lifestyle, including: Alcohol, nicotine or tobacco, and drug use. Access to firearms. Diet, exercise, and sleep habits. Safety issues such as seatbelt and bike helmet use. Sunscreen use. Work and work Astronomer. Physical exam Your health care provider will check your: Height and weight. These may be used to calculate your BMI (body mass index). BMI is a measurement that tells if you are at a healthy weight. Waist circumference. This measures the distance around your waistline. This measurement also tells if you are at a healthy weight and may help predict your risk of certain diseases, such as type 2 diabetes and high blood pressure. Heart rate and blood pressure. Body temperature. Skin for abnormal spots. What immunizations do I need?  Vaccines are usually given at various ages, according to a schedule. Your health care provider will recommend vaccines for you based on your age, medical history, and lifestyle or other factors, such as travel or where you work. What tests do I need? Screening Your health care provider may recommend screening tests for certain conditions. This may include: Lipid and cholesterol levels. Diabetes screening. This is done by checking your blood sugar (glucose) after you have not eaten for a while (fasting). Hepatitis B test.  Hepatitis C test. HIV (human immunodeficiency virus) test. STI (sexually transmitted infection) testing, if you are at risk. Lung cancer screening. Prostate cancer screening. Colorectal cancer screening. Talk with your health care provider about your test results, treatment options, and if necessary, the need for more tests. Follow these instructions at home: Eating and drinking  Eat a diet that includes fresh fruits and vegetables, whole  grains, lean protein, and low-fat dairy products. Take vitamin and mineral supplements as recommended by your health care provider. Do not drink alcohol if your health care provider tells you not to drink. If you drink alcohol: Limit how much you have to 0-2 drinks a day. Know how much alcohol is in your drink. In the [place] one drink equals one 12 oz bottle of beer (355 mL), one 5 oz glass of wine (148 mL), or one 1 oz glass of hard liquor (44 mL). Lifestyle Brush your teeth every morning and night with fluoride toothpaste. Floss one time each day. Exercise for at least 30 minutes 5 or more days each week. Do not use any products that contain nicotine or tobacco. These products include cigarettes, chewing tobacco, and vaping devices, such as e-cigarettes. If you need help quitting, ask your health care provider. Do not use drugs. If you are sexually active, practice safe sex. Use a condom or other form of protection to prevent STIs. Take aspirin only as told by your health care provider. Make sure that you understand how much to take and what form to take. Work with your health care provider to find out whether it is safe and beneficial for you to take aspirin daily. Find healthy ways to manage stress, such as: Meditation, yoga, or listening to music. Journaling. Talking to a trusted person. Spending time with friends and family. Minimize exposure to UV radiation to reduce your risk of skin cancer. Safety Always wear your seat belt while driving or riding in a vehicle. Do not drive: If you have been drinking alcohol. Do not ride with someone who has been drinking. When you are tired or distracted. While texting. If you have been using any mind-altering substances or drugs. Wear a helmet and other protective equipment during sports activities. If you have firearms in your house, make sure you follow all gun safety procedures. What's next? Go to your health care provider once a year for  an annual wellness visit. Ask your health care provider how often you should have your eyes and teeth checked. Stay up to date on all vaccines. This information is not intended to replace advice given to you by your health care provider. Make sure you discuss any questions you have with your health care provider. Document Revised: 05/03/2021 Document Reviewed: 05/03/2021 Elsevier Patient Education  2024 [employer].

## 2023-11-02 ENCOUNTER — Ambulatory Visit (HOSPITAL_BASED_OUTPATIENT_CLINIC_OR_DEPARTMENT_OTHER)
Admission: RE | Admit: 2023-11-02 | Discharge: 2023-11-02 | Disposition: A | Discharge status: Home / Self Care | Payer: 59 | Source: Ambulatory Visit | Attending: Family Medicine | Admitting: Family Medicine

## 2023-11-02 DIAGNOSIS — R0609 Other forms of dyspnea: Secondary | ICD-10-CM | POA: Insufficient documentation

## 2023-11-02 LAB — COMPREHENSIVE METABOLIC PANEL
AG Ratio: 1.9 (calc) (ref 1.0–2.5)
ALT: 33 U/L (ref 9–46)
AST: 25 U/L (ref 10–35)
Albumin: 4.4 g/dL (ref 3.6–5.1)
Alkaline phosphatase (APISO): 47 U/L (ref 35–144)
BUN: 22 mg/dL (ref 7–25)
CO2: 26 mmol/L (ref 20–32)
Calcium: 9.5 mg/dL (ref 8.6–10.3)
Chloride: 105 mmol/L (ref 98–110)
Creat: 0.97 mg/dL (ref 0.70–1.30)
Globulin: 2.3 g/dL (ref 1.9–3.7)
Glucose, Bld: 79 mg/dL (ref 65–99)
Potassium: 3.9 mmol/L (ref 3.5–5.3)
Sodium: 141 mmol/L (ref 135–146)
Total Bilirubin: 0.9 mg/dL (ref 0.2–1.2)
Total Protein: 6.7 g/dL (ref 6.1–8.1)

## 2023-11-02 LAB — URIC ACID: Uric Acid, Serum: 8.3 mg/dL — ABNORMAL HIGH (ref 4.0–8.0)

## 2023-11-02 LAB — LIPID PANEL
Cholesterol: 156 mg/dL (ref <200)
HDL: 37 mg/dL — ABNORMAL LOW (ref >=40)
LDL Cholesterol (Calc): 87 mg/dL
Non-HDL Cholesterol (Calc): 119 mg/dL (ref <130)
Total CHOL/HDL Ratio: 4.2 (calc) (ref <5.0)
Triglycerides: 233 mg/dL — ABNORMAL HIGH (ref <150)

## 2023-11-02 LAB — CBC
HCT: 41.8 % (ref 38.5–50.0)
Hemoglobin: 13.9 g/dL (ref 13.2–17.1)
MCH: 29.5 pg (ref 27.0–33.0)
MCHC: 33.3 g/dL (ref 32.0–36.0)
MCV: 88.7 fL (ref 80.0–100.0)
MPV: 10.1 fL (ref 7.5–12.5)
Platelets: 156 10*3/uL (ref 140–400)
RBC: 4.71 10*6/uL (ref 4.20–5.80)
RDW: 14.1 % (ref 11.0–15.0)
WBC: 9.8 10*3/uL (ref 3.8–10.8)

## 2023-11-02 LAB — HEMOGLOBIN A1C
Hgb A1c MFr Bld: 5.6 %{Hb} (ref <5.7)
Mean Plasma Glucose: 114 mg/dL
eAG (mmol/L): 6.3 mmol/L

## 2023-11-02 LAB — PSA: PSA: 2.54 ng/mL (ref <=4.00)

## 2023-11-02 LAB — VITAMIN B12: Vitamin B-12: 817 pg/mL (ref 200–1100)

## 2023-11-02 LAB — TSH: TSH: 3.39 m[IU]/L (ref 0.40–4.50)

## 2023-11-02 MED ORDER — IOHEXOL 350 MG/ML SOLN
75.0000 mL | Freq: Once | INTRAVENOUS | Status: AC | PRN
  Administered 2023-11-02: 75 mL via INTRAVENOUS

## 2023-11-04 ENCOUNTER — Encounter: Payer: Self-pay | Admitting: Family Medicine

## 2023-11-04 NOTE — Progress Notes (Signed)
No obvious explanation for his shortness of breath on his labs however his uric acid level was elevated which does confirm gout diagnosis.  We should work on getting this lower.  He can discuss with rheumatology or we can go ahead and start him on allopurinol 100 mg daily to improve his uric acid numbers and lower risk of recurrent gout flares.  Please send in if he is agreeable to start and we can recheck again in a few months.  Please CTA result notes as well.

## 2023-11-04 NOTE — Progress Notes (Signed)
His CT chest showed no acute abnormalities in his lungs or heart.  He does not have a blood clot.  Recommend he follow-up with cardiology soon if he is still having shortness of breath.  We discussed this at his office visit.  He needs to let us know if he is having any worsening symptoms since her office visit.

## 2023-11-05 ENCOUNTER — Other Ambulatory Visit: Payer: Self-pay | Admitting: *Deleted

## 2023-11-05 MED ORDER — ALLOPURINOL 100 MG PO TABS: 100.0000 mg | ORAL_TABLET | Freq: Every day | ORAL | 6 refills | Status: DC

## 2023-11-05 NOTE — Telephone Encounter (Signed)
See note

## 2023-11-06 ENCOUNTER — Other Ambulatory Visit: Payer: Self-pay | Admitting: *Deleted

## 2023-11-06 DIAGNOSIS — E790 Hyperuricemia without signs of inflammatory arthritis and tophaceous disease: Secondary | ICD-10-CM

## 2023-11-06 DIAGNOSIS — M79671 Pain in right foot: Secondary | ICD-10-CM

## 2023-11-06 NOTE — Telephone Encounter (Signed)
Please send in allopurinol 100 mg daily.  We should recheck his uric acid level in about 3 months.  The CT scan would have picked up any infection in his lungs.  We do not need to do any other testing for this at this point.  I do recommend he see cardiology soon as previously discussed.  Katina Degree. [name], MD 11/06/2023 9:01 AM

## 2023-11-06 NOTE — Telephone Encounter (Signed)
Future Uric acid level order

## 2023-11-11 ENCOUNTER — Other Ambulatory Visit (HOSPITAL_COMMUNITY): Payer: Self-pay

## 2023-11-22 ENCOUNTER — Telehealth: Payer: Self-pay

## 2023-11-22 NOTE — Telephone Encounter (Signed)
 Pt writes in via scheduling pool:  Dr Ladona, Happy New Year. I have an appointment scheduled for February 2nd at 2:40 to discuss this, but I wanted to let you know that I am having SEVERE shortness of breath after just the slightest exertion. I had a CAT Scan done recently and my Primary asked that I schedule an appointment to see you. If you have any openings before Feb 2nd or feel this needs more immediate attention, I will make myself available   Patients appt moved up to 12/04/23 @9 :20 and pt made awre. Forwarding message to [place].

## 2023-12-04 ENCOUNTER — Ambulatory Visit: Payer: 59 | Attending: Cardiology | Admitting: Cardiology

## 2023-12-04 ENCOUNTER — Encounter: Payer: Self-pay | Admitting: Cardiology

## 2023-12-04 VITALS — BP 118/79 | HR 69 | Resp 12 | Ht 69.0 in | Wt 360.6 lb

## 2023-12-04 DIAGNOSIS — R0609 Other forms of dyspnea: Secondary | ICD-10-CM | POA: Diagnosis not present

## 2023-12-04 DIAGNOSIS — I10 Essential (primary) hypertension: Secondary | ICD-10-CM | POA: Diagnosis not present

## 2023-12-04 DIAGNOSIS — I25118 Atherosclerotic heart disease of native coronary artery with other forms of angina pectoris: Secondary | ICD-10-CM | POA: Diagnosis not present

## 2023-12-04 DIAGNOSIS — E66813 Obesity, class 3: Secondary | ICD-10-CM | POA: Diagnosis not present

## 2023-12-04 DIAGNOSIS — Z6841 Body Mass Index (BMI) 40.0 and over, adult: Secondary | ICD-10-CM

## 2023-12-04 DIAGNOSIS — E782 Mixed hyperlipidemia: Secondary | ICD-10-CM

## 2023-12-04 MED ORDER — EZETIMIBE 10 MG PO TABS: 10.0000 mg | ORAL_TABLET | Freq: Every day | ORAL | 3 refills | Status: DC

## 2023-12-04 NOTE — Patient Instructions (Signed)
 Medication Instructions:  Your physician has recommended you make the following change in your medication: Start Ezetimibe  10 mg by mouth daily   *If you need a refill on your cardiac medications before your next appointment, please call your pharmacy*   Lab Work: none If you have labs (blood work) drawn today and your tests are completely normal, you will receive your results only by: MyChart Message (if you have MyChart) OR A paper copy in the mail If you have any lab test that is abnormal or we need to change your treatment, we will call you to review the results.   Testing/Procedures: Dr Berry Bristol recommends you have a Cardiac PET stress CT   Follow-Up: At [facility], you and your health needs are our priority.  As part of our continuing mission to provide you with exceptional heart care, we have created designated Provider Care Teams.  These Care Teams include your primary Cardiologist (physician) and Advanced Practice Providers (APPs -  Physician Assistants and Nurse Practitioners) who all work together to provide you with the care you need, when you need it.  We recommend signing up for the patient portal called "MyChart".  Sign up information is provided on this After Visit Summary.  MyChart is used to connect with patients for Virtual Visits (Telemedicine).  Patients are able to view lab/test results, encounter notes, upcoming appointments, etc.  Non-urgent messages can be sent to your provider as well.   To learn more about what you can do with MyChart, go to [email or web address].    Your next appointment:   6 month(s)  Provider:   Knox Perl, MD     Other Instructions     Please report to Radiology at the [facility] Main Entrance 30 minutes early for your test.  [address] [place], [place] [address]                         OR   Please report to Radiology at [facility] Main Entrance, medical mall, 30 mins prior to your  test.  [address]  [place], [place]  [phone]  How to Prepare for Your Cardiac PET/CT Stress Test:  Nothing to eat or drink, except water, 3 hours prior to arrival time.  NO caffeine/decaffeinated products, or chocolate 12 hours prior to arrival. (Please note decaffeinated beverages (teas/coffees) still contain caffeine).  If you have caffeine within 12 hours prior, the test will need to be rescheduled.  Medication instructions: Do not take erectile dysfunction medications for 72 hours prior to test (sildenafil , tadalafil) Do not take nitrates (isosorbide mononitrate, Ranexa) the day before or day of test Do not take tamsulosin the day before or morning of test Hold theophylline containing medications for 12 hours. Hold Dipyridamole 48 hours prior to the test.  Diabetic Preparation: If able to eat breakfast prior to 3 hour fasting, you may take all medications, including your insulin . Do not worry if you miss your breakfast dose of insulin  - start at your next meal. If you do not eat prior to 3 hour fast-Hold all diabetes (oral and insulin ) medications. Patients who wear a continuous glucose monitor MUST remove the device prior to scanning.  You may take your remaining medications with water.  NO perfume, cologne or lotion on chest or abdomen area. FEMALES - Please avoid wearing dresses to this appointment.  Total time is 1 to 2 hours; you may want to bring reading  material for the waiting time.   In preparation for your appointment, medication and supplies will be purchased.  Appointment availability is limited, so if you need to cancel or reschedule, please call the Radiology Department at [phone] Maryan Smalling) OR [phone] [facility]) 24 hours in advance to avoid a cancellation fee of $100.00  What to Expect When you Arrive:  Once you arrive and check in for your appointment, you will be taken to a preparation room within the Radiology Department.  A technologist  or Nurse will obtain your medical history, verify that you are correctly prepped for the exam, and explain the procedure.  Afterwards, an IV will be started in your arm and electrodes will be placed on your skin for EKG monitoring during the stress portion of the exam. Then you will be escorted to the PET/CT scanner.  There, staff will get you positioned on the scanner and obtain a blood pressure and EKG.  During the exam, you will continue to be connected to the EKG and blood pressure machines.  A small, safe amount of a radioactive tracer will be injected in your IV to obtain a series of pictures of your heart along with an injection of a stress agent.    After your Exam:  It is recommended that you eat a meal and drink a caffeinated beverage to counter act any effects of the stress agent.  Drink plenty of fluids for the remainder of the day and urinate frequently for the first couple of hours after the exam.  Your doctor will inform you of your test results within 7-10 business days.  For more information and frequently asked questions, please visit our website: [email or web address]  For questions about your test or how to prepare for your test, please call: Cardiac Imaging Nurse Navigators Office: [phone]

## 2023-12-04 NOTE — Progress Notes (Signed)
 Cardiology Office Note:  .   Date:  12/04/2023  ID:  Michael [name], DOB [birth year], MRN [id number] PCP: Rodney Clamp, MD  [facility] Providers Cardiologist:  Knox Perl, MD   History of Present Illness: .   Michael [name] is a 58 y.o. Caucasian male with hypertension, hyperlipidemia, morbid obesity and obstructive sleep apnea on CPAP and compliant, CAD presenting with NSTEMI S/P angioplasty and stenting to the right coronary artery last seen by me about 6 to 7 months ago presents for follow-up of worsening dyspnea over the last few weeks.  Discussed the use of AI scribe software for clinical note transcription with the patient, who gave verbal consent to proceed.  History of Present Illness   The patient, with a history of vasculitis and obesity, presents with increased shortness of breath. He reports that this has been affecting his daily activities, including walking from the parking garage to his workplace. The patient has also been coughing up thick mucus, which he describes as mostly clear or white. He reports that the frequency of this coughing has decreased from hourly to two or three times a day. The patient also mentions that he has noticed his heart rate increasing to around 150 beats per minute once a day. He has gained weight despite being on Tazepatide for weight loss. The patient was hospitalized in October for vasculitis and has not had a recurrence since. He was also put on steroids, which he stopped taking in early September.      Labs   Lab Results  Component Value Date   CHOL 156 11/01/2023   HDL 37 (L) 11/01/2023   LDLCALC 87 11/01/2023   TRIG 233 (H) 11/01/2023   CHOLHDL 4.2 11/01/2023   Lab Results  Component Value Date   NA 141 11/01/2023   K 3.9 11/01/2023   CO2 26 11/01/2023   GLUCOSE 79 11/01/2023   BUN 22 11/01/2023   CREATININE 0.97 11/01/2023   CALCIUM  9.5 11/01/2023   GFR 96.04 10/05/2022   EGFR 84 02/13/2023   GFRNONAA 51 (L) 09/15/2023       Latest Ref Rng & Units 11/01/2023    3:18 PM 09/15/2023    9:34 PM 02/13/2023    8:45 AM  BMP  Glucose 65 - 99 mg/dL 79  469  89   BUN 7 - 25 mg/dL 22  20  26    Creatinine 0.70 - 1.30 mg/dL 6.29  5.28  4.13   BUN/Creat Ratio 6 - 22 (calc) SEE NOTE:   25   Sodium 135 - 146 mmol/L 141  134  144   Potassium 3.5 - 5.3 mmol/L 3.9  4.0  4.7   Chloride 98 - 110 mmol/L 105  99  104   CO2 20 - 32 mmol/L 26  27  26    Calcium  8.6 - 10.3 mg/dL 9.5  9.3  9.5       Latest Ref Rng & Units 11/01/2023    3:18 PM 09/15/2023    9:34 PM 02/13/2023    8:45 AM  CBC  WBC 3.8 - 10.8 Thousand/uL 9.8  8.4  7.3   Hemoglobin 13.2 - 17.1 g/dL 24.4  01.0  27.2   Hematocrit 38.5 - 50.0 % 41.8  40.1  44.6   Platelets 140 - 400 Thousand/uL 156  147  166    External Labs: Review of Systems  Constitutional: Positive for weight gain.  Cardiovascular:  Positive for dyspnea on exertion. Negative for chest  pain and leg swelling.    Physical Exam:   VS:  BP 118/79 (BP Location: Left Arm, Patient Position: Sitting, Cuff Size: Large)   Pulse 69   Resp 12   Ht 5\' 9"  (1.753 m)   Wt (!) 360 lb 9.6 oz (163.6 kg)   SpO2 97%   BMI 53.25 kg/m    Wt Readings from Last 3 Encounters:  12/04/23 (!) 360 lb 9.6 oz (163.6 kg)  11/01/23 (!) 353 lb 6.4 oz (160.3 kg)  10/11/23 (!) 344 lb 12.8 oz (156.4 kg)    Physical Exam Constitutional:      Comments: Morbidly obese in no acute distress.  Neck:     Vascular: No carotid bruit or JVD.  Cardiovascular:     Rate and Rhythm: Normal rate and regular rhythm.     Pulses: Intact distal pulses.          Carotid pulses are 2+ on the right side and 2+ on the left side.    Heart sounds: Normal heart sounds. No murmur heard.    No gallop.  Pulmonary:     Effort: Pulmonary effort is normal.     Breath sounds: Normal breath sounds.  Abdominal:     General: Bowel sounds are normal.     Palpations: Abdomen is soft.     Comments: Obese. Pannus present  Musculoskeletal:      Right lower leg: No edema.     Left lower leg: No edema.     Studies Reviewed: Aaron Aas    Coronary angiogram 05/20/2020 Severe single-vessel coronary artery disease with sequential 40-50% mid and 80-90% distal RCA stenoses.  The distal RCA lesion appears ulcerated and is likely the culprit lesion for the patient's NSTEMI. No angiographically significant coronary artery disease involving the left coronary artery. Low normal left ventricular systolic function with basal and mid inferior hypokinesis.  Left ventricular filling pressure is moderately elevated (LVEDP ~30 mmHg). Successful PCI to the distal RCA using Resolute Onyx 3.5 x 26 mm drug-eluting stent (postdilated to 4.1 mm) with 0% residual stenosis and TIMI-3 flow.  RCA Resolute Onyx 3.5 x 26 mm drug-eluting stent  CT angiogram chest for PE protocol 11/02/2023: Cardiovascular: No filling defects in the pulmonary arteries to suggest pulmonary emboli. Heart is normal size. Aorta is normal caliber. Lungs/Pleura: No confluent opacities or effusions.  EKG:    EKG Interpretation Date/Time:  Wednesday December 04 2023 09:28:50 EST Ventricular Rate:  64 PR Interval:  190 QRS Duration:  68 QT Interval:  384 QTC Calculation: 396 R Axis:   2  Text Interpretation: EKG 12/04/2023: Normal sinus rhythm at rate of 64 bpm, cannot exclude inferior infarct old.  No evidence of ischemia.  No significant change from prior EKG.  Q waves in lead III and aVF slightly more prominent Confirmed by Freeland Pracht, Jagadeesh [phone]) on 12/04/2023 9:44:50 AM    EKG 02/11/2023: Normal sinus rhythm at the rate of 69 beats minute, normal EKG.   Medications and allergies    No Known Allergies   Current Outpatient Medications:    acitretin (SORIATANE) 25 MG capsule, Take 25 mg by mouth daily before breakfast., Disp: , Rfl:    allopurinol  (ZYLOPRIM ) 100 MG tablet, Take 1 tablet (100 mg total) by mouth daily. (Patient taking differently: Take 100 mg by mouth as needed.),  Disp: 30 tablet, Rfl: 6   ASPIRIN  LOW DOSE 81 MG tablet, TAKE 1 TABLET BY MOUTH EVERY DAY, Disp: 30 tablet, Rfl: 8   atorvastatin  (LIPITOR )  80 MG tablet, TAKE 1 TABLET BY MOUTH EVERYDAY AT BEDTIME, Disp: 30 tablet, Rfl: 8   bumetanide  (BUMEX ) 0.5 MG tablet, Take 1 tablet (0.5 mg total) by mouth daily., Disp: 30 tablet, Rfl: 7   cyanocobalamin (VITAMIN B12) 1000 MCG tablet, Take 1 tablet (1,000 mcg total) by mouth daily., Disp: 90 tablet, Rfl: 3   ezetimibe  (ZETIA ) 10 MG tablet, Take 1 tablet (10 mg total) by mouth daily., Disp: 90 tablet, Rfl: 3   metoprolol  tartrate (LOPRESSOR ) 25 MG tablet, TAKE 1 TABLET BY MOUTH TWICE A DAY, Disp: 60 tablet, Rfl: 5   Multiple Vitamin (MULTIVITAMIN) capsule, Take 1 capsule by mouth daily., Disp: , Rfl:    nitroGLYCERIN  (NITROSTAT ) 0.4 MG SL tablet, Place 1 tablet (0.4 mg total) under the tongue every 5 (five) minutes x 3 doses as needed for chest pain., Disp: 25 tablet, Rfl: 3   sildenafil  (VIAGRA ) 100 MG tablet, TAKE 1 TABLET(100 MG) BY MOUTH DAILY AS NEEDED FOR ERECTILE DYSFUNCTION, Disp: 30 tablet, Rfl: 1   tirzepatide  (ZEPBOUND ) 7.5 MG/0.5ML Pen, Inject 7.5 mg into the skin once a week., Disp: 2 mL, Rfl: 2   triamterene -hydrochlorothiazide (MAXZIDE-25) 37.5-25 MG tablet, TAKE 1 TABLET BY MOUTH EVERY DAY IN THE MORNING (Patient taking differently: Take 1 tablet by mouth every other day. Pt states taking every other day), Disp: 30 tablet, Rfl: 2   valsartan  (DIOVAN ) 160 MG tablet, TAKE 1 TABLET BY MOUTH EVERY DAY, Disp: 30 tablet, Rfl: 11   Zinc 30 MG CAPS, Take 1 capsule by mouth daily in the afternoon., Disp: , Rfl:    Suvorexant  (BELSOMRA ) 10 MG TABS, Take 1 tablet (10 mg total) by mouth at bedtime as needed. (Patient not taking: Reported on 12/04/2023), Disp: 30 tablet, Rfl: 5   testosterone  cypionate (DEPOTESTOSTERONE CYPIONATE) 200 MG/ML injection, Inject 1 mL (200 mg total) into the muscle every 14 (fourteen) days. (Patient not taking: Reported on  12/04/2023), Disp: 7 mL, Rfl: 1   ASSESSMENT AND PLAN: .      ICD-10-CM   1. Dyspnea on exertion  R06.09 EKG 12-Lead    NM PET CT CARDIAC PERFUSION MULTI W/ABSOLUTE BLOODFLOW    Cardiac Stress Test: Informed Consent Details: Physician/Practitioner Attestation; Transcribe to consent form and obtain patient signature    2. Coronary artery disease of native artery of native heart with stable angina pectoris (HCC)  I25.118 NM PET CT CARDIAC PERFUSION MULTI W/ABSOLUTE BLOODFLOW    Cardiac Stress Test: Informed Consent Details: Physician/Practitioner Attestation; Transcribe to consent form and obtain patient signature    3. Primary hypertension  I10     4. Class 3 severe obesity due to excess calories with serious comorbidity and body mass index (BMI) of 50.0 to 59.9 in adult (HCC)  Z61.096    Z68.43    E66.01     5. Mixed hypercholesterolemia and hypertriglyceridemia  E78.2 ezetimibe  (ZETIA ) 10 MG tablet      Assessment and Plan    Dyspnea on Exertion Increased shortness of breath with exertion, possibly related to recent weight gain and history of vasculitis. CT scan ruled out pulmonary embolism. -Schedule PET stress test to evaluate cardiac function and potential contribution to dyspnea. -Consider weight loss surgery if negative stress test and he resumes exercise and has no success with weight loss. His BMI now is 53+.  Hyperlipidemia LDL above target level despite atorvastatin  80mg  daily. -Add ezetimibe  10mg  daily to further lower LDL.  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 -  Continue current management and follow-up with rheumatologist in May.  Hypertension Well-controlled on current regimen of Triamterene - Hydrochlorothiazide, Metoprolol  Tartrate, and Valsartan . -Consider changing Metoprolol  Tartrate to alternative medication if cold extremities persist after cardiac evaluation.  Follow-up in 6 months if PET stress test results  are normal.         Informed Consent   Shared Decision Making/Informed Consent The risks [chest pain, shortness of breath, cardiac arrhythmias, dizziness, blood pressure fluctuations, myocardial infarction, stroke/transient ischemic attack, nausea, vomiting, allergic reaction, radiation exposure, metallic taste sensation and life-threatening complications (estimated to be 1 in 10,000)], benefits (risk stratification, diagnosing coronary artery disease, treatment guidance) and alternatives of a cardiac PET stress test were discussed in detail with Michael [name] and he agrees to proceed.      Signed,  Knox Perl, MD, [facility] 12/04/2023, 7:39 PM [facility] [address] [place], [place] [address] Phone: [phone]. Fax:  [phone]

## 2023-12-08 ENCOUNTER — Other Ambulatory Visit: Payer: Self-pay | Admitting: Cardiology

## 2023-12-08 DIAGNOSIS — I10 Essential (primary) hypertension: Secondary | ICD-10-CM

## 2023-12-23 ENCOUNTER — Ambulatory Visit: Payer: 59 | Admitting: Cardiology

## 2024-02-03 ENCOUNTER — Other Ambulatory Visit: Payer: Self-pay

## 2024-02-03 MED ORDER — ASPIRIN 81 MG PO TBEC: 81.0000 mg | DELAYED_RELEASE_TABLET | Freq: Every day | ORAL | 10 refills | Status: AC

## 2024-02-04 ENCOUNTER — Ambulatory Visit (HOSPITAL_COMMUNITY)
Admission: RE | Admit: 2024-02-04 | Discharge: 2024-02-04 | Disposition: A | Discharge status: Home / Self Care | Payer: 59 | Source: Ambulatory Visit | Attending: Cardiology | Admitting: Cardiology

## 2024-02-04 DIAGNOSIS — I25118 Atherosclerotic heart disease of native coronary artery with other forms of angina pectoris: Secondary | ICD-10-CM | POA: Insufficient documentation

## 2024-02-04 DIAGNOSIS — R0609 Other forms of dyspnea: Secondary | ICD-10-CM | POA: Insufficient documentation

## 2024-02-04 LAB — NM PET CT CARDIAC PERFUSION MULTI W/ABSOLUTE BLOODFLOW
LV dias vol: 98 mL (ref 62–150)
LV sys vol: 35 mL
MBFR: 2.83
Nuc Rest EF: 64 %
Nuc Stress EF: 75 %
Peak HR: 81 {beats}/min
Rest HR: 60 {beats}/min
Rest MBF: 0.6 ml/g/min
Rest Nuclear Isotope Dose: 29.9 mCi
ST Depression (mm): 0 mm
Stress MBF: 1.7 ml/g/min
Stress Nuclear Isotope Dose: 29.9 mCi

## 2024-02-04 MED ORDER — RUBIDIUM RB82 GENERATOR (RUBYFILL)
29.9300 | PACK | Freq: Once | INTRAVENOUS | Status: AC
  Administered 2024-02-04: 29.93 via INTRAVENOUS

## 2024-02-04 MED ORDER — RUBIDIUM RB82 GENERATOR (RUBYFILL)
29.9500 | PACK | Freq: Once | INTRAVENOUS | Status: AC
  Administered 2024-02-04: 29.95 via INTRAVENOUS

## 2024-02-04 MED ORDER — REGADENOSON 0.4 MG/5ML IV SOLN
0.4000 mg | Freq: Once | INTRAVENOUS | Status: AC
  Administered 2024-02-04: 0.4 mg via INTRAVENOUS

## 2024-02-04 MED ORDER — REGADENOSON 0.4 MG/5ML IV SOLN
INTRAVENOUS | Status: AC
  Filled 2024-02-04: qty 5

## 2024-02-05 ENCOUNTER — Encounter: Payer: Self-pay | Admitting: Cardiology

## 2024-02-05 NOTE — Progress Notes (Signed)
 Your stress test is completely normal, it is highly specific test so nothing to worry about from cardiac standpoint.  There is diffuse bronchial wall thickening and your shortness of breath could potentially be related to pulmonary issues.  Could consider pulmonary consultation.  Please continue exercise without any limitations.

## 2024-03-02 ENCOUNTER — Other Ambulatory Visit: Payer: Self-pay | Admitting: Cardiology

## 2024-03-02 DIAGNOSIS — N521 Erectile dysfunction due to diseases classified elsewhere: Secondary | ICD-10-CM

## 2024-03-03 ENCOUNTER — Other Ambulatory Visit: Payer: Self-pay | Admitting: Cardiology

## 2024-03-04 NOTE — Telephone Encounter (Signed)
 Pt's pharmacy is requesting a refill on medication sildenafil. Would Dr. Berry Bristol like to refill this non cardiac medication? Please address

## 2024-04-02 ENCOUNTER — Encounter: Payer: 59 | Admitting: Internal Medicine

## 2024-04-21 ENCOUNTER — Other Ambulatory Visit: Payer: Self-pay | Admitting: Cardiology

## 2024-04-21 DIAGNOSIS — I10 Essential (primary) hypertension: Secondary | ICD-10-CM

## 2024-04-24 ENCOUNTER — Other Ambulatory Visit: Payer: Self-pay | Admitting: Cardiology

## 2024-04-24 DIAGNOSIS — I10 Essential (primary) hypertension: Secondary | ICD-10-CM

## 2024-04-28 ENCOUNTER — Encounter: Payer: Self-pay | Admitting: Internal Medicine

## 2024-04-28 ENCOUNTER — Ambulatory Visit

## 2024-04-28 ENCOUNTER — Ambulatory Visit: Attending: Internal Medicine | Admitting: Internal Medicine

## 2024-04-28 VITALS — BP 107/68 | HR 66 | Resp 16 | Ht 68.5 in | Wt 355.0 lb

## 2024-04-28 DIAGNOSIS — R768 Other specified abnormal immunological findings in serum: Secondary | ICD-10-CM | POA: Diagnosis not present

## 2024-04-28 DIAGNOSIS — M310 Hypersensitivity angiitis: Secondary | ICD-10-CM

## 2024-04-28 DIAGNOSIS — M79671 Pain in right foot: Secondary | ICD-10-CM

## 2024-04-28 NOTE — Patient Instructions (Signed)
 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

## 2024-04-28 NOTE — Progress Notes (Signed)
 Office Visit Note  Patient: Michael [name]             Date of Birth: 1966-05-21           MRN: [id number]             PCP: Kennyth Worth HERO, MD Referring: Kennyth Worth HERO, MD Visit Date: 04/28/2024  Subjective:  New Patient (Initial Visit) (Patient states he has lower back pain. Patient states there is a constant bump on the top of his right foot. Patient states he has pain between his shoulders at the base of his neck. )   Discussed the use of AI scribe software for clinical note transcription with the patient, who gave verbal consent to proceed.  History of Present Illness   Michael [name] is a 58 year old male here for evaluation of vasculitic rashes newly developed in associated with respiratory symptoms and joint pain.  He has a history of vasculitis that began in October of the previous year, characterized by a vasculitic rash that started on his leg and rapidly spread to his chest, accompanied by fever. He was hospitalized and treated with antibiotics, which resolved the symptoms. A positive ANA test was noted, though he is unsure of its implications.  He experiences labored breathing, particularly when walking up stairs, and is more winded than expected. A recent cardiac PET scan revealed diffuse thickening of his lung walls. He also has daily production of thick, clear mucus, which he coughs up, especially in the morning.  He has a history of dyshidrotic eczema since 2020, initially affecting his foot and later spreading to his hands. The condition is managed with a prescription medication, which he takes regularly to prevent flare-ups, though he does not recall the name. Symptoms return if he misses doses for a few days.  He has a history of gout, with episodes occurring in his feet, primarily affecting the front of the foot. He was prescribed allopurinol  but has not taken it as he has not had a recent flare-up. He describes a persistent knot on the top of his foot that varies in  severity.  He has a history of sleep apnea, diagnosed after experiencing severe sleep disturbances. He uses a CPAP machine nightly but still averages only about five hours of sleep per night.  He reports difficulty losing weight, requiring significant dietary restriction and exercise to achieve weight loss, but regains weight quickly if he stops these efforts.       Activities of Daily Living:  Patient reports morning stiffness for 5-10 minutes.   Patient Reports nocturnal pain.  Difficulty dressing/grooming: Denies Difficulty climbing stairs: Denies Difficulty getting out of chair: Denies Difficulty using hands for taps, buttons, cutlery, and/or writing: Denies  Review of Systems  Constitutional:  Positive for fatigue.  HENT:  Negative for mouth sores and mouth dryness.   Eyes:  Negative for dryness.  Respiratory:  Positive for shortness of breath.   Cardiovascular:  Negative for chest pain and palpitations.  Gastrointestinal:  Positive for constipation and diarrhea. Negative for blood in stool.  Endocrine: Negative for increased urination.  Genitourinary:  Negative for involuntary urination.  Musculoskeletal:  Positive for joint pain, joint pain, joint swelling, myalgias, morning stiffness and myalgias. Negative for gait problem, muscle weakness and muscle tenderness.  Skin:  Negative for color change, rash, hair loss and sensitivity to sunlight.  Allergic/Immunologic: Negative for susceptible to infections.  Neurological:  Positive for dizziness. Negative for headaches.  Hematological:  Negative for swollen glands.  Psychiatric/Behavioral:  Positive for sleep disturbance. Negative for depressed mood. The patient is not nervous/anxious.     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 05/2020 Echo: EF 60%, no rwma, Gr2 DD. Triv MR.   Back pain    CAD (coronary artery disease)    a. 04/2020 NSTEMI/PCI: LM nl, LAD nl, LCX nl, RCA 34m, 85d (3.6x26 Resolute Onyx DES), RPDA nl, RPAV nl, EF 50-55%.   Dyshidrotic eczema    History of heart attack    Hyperlipidemia LDL goal <70 08/28/2018   Obesity hypoventilation syndrome (HCC)    Primary hypertension 08/28/2018   Sleep apnea    Sleep apnea with use of continuous positive airway pressure (CPAP) 08/27/2013   SOB (shortness of breath)    Swallowing difficulty    Vasculitis (HCC)    Viral meningitis     Family History  Problem Relation Age of Onset   Alzheimer's disease Mother    Past Surgical History:  Procedure Laterality Date   CARDIAC CATHETERIZATION     CORONARY STENT INTERVENTION N/A 05/20/2020   Procedure: CORONARY STENT INTERVENTION;  Surgeon: Mady Bruckner, MD;  Location: [facility];  Service: Cardiovascular;  Laterality: N/A;   LEFT HEART CATH AND CORONARY ANGIOGRAPHY N/A 05/20/2020   Procedure: LEFT HEART CATH AND CORONARY ANGIOGRAPHY;  Surgeon: Mady Bruckner, MD;  Location: [facility];  Service: Cardiovascular;  Laterality: N/A;   Social History   Social History Narrative   Patient is single  and lives alone.   Patient is working full-time.   Patient has a college education.   Patient is right-handed.   Patient drinks 3-4 cups of coffee daily.   Immunization History  Administered Date(s) Administered   Influenza, Quadrivalent, Recombinant, Inj, Pf 09/06/2020   Influenza,inj,Quad PF,6+ Mos 08/28/2018, 08/31/2019, 08/19/2022   Influenza-Unspecified 08/17/2021, 09/11/2023   PFIZER(Purple Top)SARS-COV-2 Vaccination 02/10/2020, 03/02/2020   Tdap 06/05/2012   Zoster Recombinant(Shingrix) 08/16/2020, 10/19/2020     Objective: Vital Signs: BP 107/68 (BP Location: Left Arm, Patient Position: Sitting, Cuff Size: Large)   Pulse 66   Resp 16   Ht 5' 8.5 (1.74 m)   Wt (!) 355 lb (161 kg)   BMI 53.19 kg/m    Physical Exam Constitutional:      Appearance: He is obese.  HENT:     Nose: Nose normal.     Mouth/Throat:     Mouth: Mucous membranes are moist.     Pharynx: Oropharynx is clear.   Eyes:     Conjunctiva/sclera: Conjunctivae normal.    Cardiovascular:  Rate and Rhythm: Normal rate and regular rhythm.  Pulmonary:     Effort: Pulmonary effort is normal.     Breath sounds: Normal breath sounds.  Lymphadenopathy:     Cervical: No cervical adenopathy.   Skin:    General: Skin is warm and dry.     Comments: 1+ pitting edema   Neurological:     Mental Status: He is alert.   Psychiatric:        Mood and Affect: Mood normal.      Musculoskeletal Exam:  Shoulders full ROM no tenderness or swelling Elbows full ROM no tenderness or swelling Wrists full ROM no tenderness or swelling Fingers full ROM no tenderness or swelling Knees full ROM no tenderness or swelling Ankles full ROM no tenderness or swelling Right midfoot bony nodule without tenderness or overlying swelling MTPs full ROM no tenderness or swelling   Investigation: No additional findings.  Imaging: XR Foot 2 Views Right Result Date: 04/28/2024 X-ray right foot 2 views Tibiotalar joint  space alignment appear normal.  Small posterior calcification.  Also with very small plantar and posterior calcaneal enthesophytes.  Midfoot joint spaces appear preserved but with some dorsal bone spurring on cuneiform bones.  MTP joints appear normal.  No erosions or abnormal calcifications seen. Impression Mild midfoot and hindfoot degenerative changes, with some posterior calcifications would be consistent with chronic/recurrent tendinitis   Recent Labs: Lab Results  Component Value Date   WBC 9.8 11/01/2023   HGB 13.9 11/01/2023   PLT 156 11/01/2023   NA 141 11/01/2023   K 3.9 11/01/2023   CL 105 11/01/2023   CO2 26 11/01/2023   GLUCOSE 79 11/01/2023   BUN 22 11/01/2023   CREATININE 0.97 11/01/2023   BILITOT 0.9 11/01/2023   ALKPHOS 38 09/15/2023   AST 25 11/01/2023   ALT 33 11/01/2023   PROT 6.7 11/01/2023   ALBUMIN 4.3 09/15/2023   CALCIUM  9.5 11/01/2023   GFRAA >60 05/21/2020    Speciality Comments: No specialty comments available.  Procedures:  No procedures performed Allergies: Patient has no known allergies.   Assessment / Plan:     Visit Diagnoses: Positive ANA (antinuclear antibody)  Leukocytoclastic vasculitis (HCC) - Plan: Sedimentation rate, Rheumatoid factor, Cyclic citrul peptide antibody, IgG, Anti-Smith antibody, Anti-DNA antibody, double-stranded, RNP Antibody, IgG, IgA, IgM, Thyroid  Peroxidase Antibodies (TPO) (REFL), Thyroglobulin antibody Acute vasculitic rash resolved with antibiotics, suggesting infectious trigger. Positive ANA indicates possible autoimmune involvement.  At the time of this encounter there is no rash present.  No specific clinical criteria for systemic connective tissue disease noted on our exam now. Discussed possibility of transient abnormal labs in the setting of infection but will check more extensive antibody panel today including double-stranded ENA RNP immunoglobulin titers rheumatoid factor CCP also antithyroid antibody  markers.  Right foot pain - Plan: XR Foot 2 Views Right Degenerative arthritis of the foot Chronic right midfoot pain suggests degenerative arthritis with possible bone spurring or joint misalignment. - Order x-ray of the right foot to assess for degenerative changes.  Gout Gout with infrequent episodes, uric acid slightly elevated. Not currently on allopurinol  due to lack of recent flare-ups. - Consider initiating allopurinol  if flare-ups occur.  Diffuse thickening of lung walls Cardiac PET scan showed diffuse thickening of lung walls, raising concern for bronchiectasis. Symptoms include labored breathing and chronic mucus production. - Recommend f/u with pulmonology for further evaluation and management if respiratory symptoms continue.  Sleep apnea Severe sleep apnea managed with CPAP therapy,  improving symptoms but poor sleep quality persists. - Continue CPAP therapy.        Orders: Orders Placed This Encounter  Procedures   XR Foot 2 Views Right   Sedimentation rate   Rheumatoid factor   Cyclic citrul peptide antibody, IgG   Anti-Smith antibody   Anti-DNA antibody, double-stranded   RNP Antibody   IgG, IgA, IgM   Thyroid  Peroxidase Antibodies (TPO) (REFL)   Thyroglobulin antibody   No orders of the defined types were placed in this encounter.    Follow-Up Instructions: No follow-ups on file.   Lonni LELON Ester, MD  Note - This record has been created using [employer].  Chart creation errors have been sought, but may not always  have been located. Such creation errors do not reflect on  the standard of medical care.

## 2024-04-30 LAB — THYROID PEROXIDASE ANTIBODIES (TPO) (REFL): Thyroperoxidase Ab SerPl-aCnc: 2 [IU]/mL (ref <9)

## 2024-04-30 LAB — IGG, IGA, IGM
IgG (Immunoglobin G), Serum: 993 mg/dL (ref 600–1640)
IgM, Serum: 233 mg/dL (ref 50–300)
Immunoglobulin A: 116 mg/dL (ref 47–310)

## 2024-04-30 LAB — CYCLIC CITRUL PEPTIDE ANTIBODY, IGG: Cyclic Citrullin Peptide Ab: <16 U

## 2024-04-30 LAB — RHEUMATOID FACTOR: Rheumatoid fact SerPl-aCnc: <10 [IU]/mL (ref <14)

## 2024-04-30 LAB — RNP ANTIBODY: Ribonucleic Protein(ENA) Antibody, IgG: <1 AI

## 2024-04-30 LAB — THYROGLOBULIN ANTIBODY: Thyroglobulin Ab: <1 [IU]/mL (ref <=1)

## 2024-04-30 LAB — ANTI-DNA ANTIBODY, DOUBLE-STRANDED: ds DNA Ab: <1 [IU]/mL

## 2024-04-30 LAB — ANTI-SMITH ANTIBODY: ENA SM Ab Ser-aCnc: <1 AI

## 2024-04-30 LAB — SEDIMENTATION RATE: Sed Rate: 9 mm/h (ref 0–20)

## 2024-05-22 ENCOUNTER — Emergency Department (HOSPITAL_BASED_OUTPATIENT_CLINIC_OR_DEPARTMENT_OTHER)
Admission: EM | Admit: 2024-05-22 | Discharge: 2024-05-22 | Disposition: A | Discharge status: Home / Self Care | Attending: Emergency Medicine | Admitting: Emergency Medicine

## 2024-05-22 ENCOUNTER — Other Ambulatory Visit: Payer: Self-pay

## 2024-05-22 ENCOUNTER — Encounter (HOSPITAL_BASED_OUTPATIENT_CLINIC_OR_DEPARTMENT_OTHER): Payer: Self-pay

## 2024-05-22 DIAGNOSIS — Z7982 Long term (current) use of aspirin: Secondary | ICD-10-CM | POA: Insufficient documentation

## 2024-05-22 DIAGNOSIS — I10 Essential (primary) hypertension: Secondary | ICD-10-CM | POA: Insufficient documentation

## 2024-05-22 DIAGNOSIS — Z79899 Other long term (current) drug therapy: Secondary | ICD-10-CM | POA: Diagnosis not present

## 2024-05-22 DIAGNOSIS — T7840XA Allergy, unspecified, initial encounter: Secondary | ICD-10-CM | POA: Diagnosis not present

## 2024-05-22 DIAGNOSIS — R21 Rash and other nonspecific skin eruption: Secondary | ICD-10-CM | POA: Diagnosis present

## 2024-05-22 MED ORDER — FAMOTIDINE IN NACL 20-0.9 MG/50ML-% IV SOLN
20.0000 mg | Freq: Once | INTRAVENOUS | Status: AC
  Administered 2024-05-22: 20 mg via INTRAVENOUS
  Filled 2024-05-22: qty 50

## 2024-05-22 MED ORDER — METHYLPREDNISOLONE SODIUM SUCC 125 MG IJ SOLR
125.0000 mg | Freq: Once | INTRAMUSCULAR | Status: AC
  Administered 2024-05-22: 125 mg via INTRAVENOUS
  Filled 2024-05-22: qty 2

## 2024-05-22 MED ORDER — EPINEPHRINE 0.3 MG/0.3ML IJ SOAJ: 0.3000 mg | INTRAMUSCULAR | 0 refills | Status: AC | PRN

## 2024-05-22 MED ORDER — EPINEPHRINE 0.3 MG/0.3ML IJ SOAJ
0.3000 mg | Freq: Once | INTRAMUSCULAR | Status: AC
  Administered 2024-05-22: 0.3 mg via INTRAMUSCULAR
  Filled 2024-05-22: qty 0.3

## 2024-05-22 NOTE — ED Provider Notes (Signed)
  EMERGENCY DEPARTMENT AT [facility] Provider Note   CSN: [id number] Arrival date & time: 05/22/24  1009     Patient presents with: Allergic Reaction   Michael [name] is a 58 y.o. male with hypertension, HFpEF, presents with concern for a yellowjacket sting at approximately 8 AM this morning.  States that after this occurred, he noticed a rash on his left arm and underarms.  He also noticed difficulty swallowing.  Denies any difficulty breathing or feeling short of breath.  No nausea or vomiting.  He took 2 Benadryl  prior to arrival without relief of symptoms.  No known history of allergies.    Allergic Reaction Presenting symptoms: rash        Prior to Admission medications   Medication Sig Start Date End Date Taking? Authorizing Provider  EPINEPHrine  0.3 mg/0.3 mL IJ SOAJ injection Inject 0.3 mg into the muscle as needed for anaphylaxis. 05/22/24  Yes Veta Palma, PA-C  acitretin (SORIATANE) 25 MG capsule Take 25 mg by mouth daily before breakfast.    [provider]  allopurinol  (ZYLOPRIM ) 100 MG tablet Take 1 tablet (100 mg total) by mouth daily. Patient not taking: Reported on 04/28/2024 11/05/23   Kennyth Worth HERO, MD  aspirin  EC (ASPIRIN  LOW DOSE) 81 MG tablet Take 1 tablet (81 mg total) by mouth daily. 02/03/24   Ladona Heinz, MD  atorvastatin  (LIPITOR ) 80 MG tablet TAKE 1 TABLET BY MOUTH EVERYDAY AT BEDTIME 07/29/23   Ladona Heinz, MD  bumetanide  (BUMEX ) 0.5 MG tablet TAKE 1 TABLET BY MOUTH EVERY DAY 04/24/24   Ladona Heinz, MD  cyanocobalamin (VITAMIN B12) 1000 MCG tablet Take 1 tablet (1,000 mcg total) by mouth daily. 10/11/23   Kennyth Worth HERO, MD  ezetimibe  (ZETIA ) 10 MG tablet Take 1 tablet (10 mg total) by mouth daily. 12/04/23 03/03/24  Ladona Heinz, MD  MAGNESIUM PO Take by mouth.    [provider]  metoprolol  tartrate (LOPRESSOR ) 25 MG tablet TAKE 1 TABLET BY MOUTH TWICE A DAY 03/04/24   Ladona Heinz, MD  Multiple Vitamin (MULTIVITAMIN) capsule  Take 1 capsule by mouth daily.    [provider]  nitroGLYCERIN  (NITROSTAT ) 0.4 MG SL tablet Place 1 tablet (0.4 mg total) under the tongue every 5 (five) minutes x 3 doses as needed for chest pain. 05/21/20   Vivienne Lonni Ingle, NP  sildenafil  (VIAGRA ) 100 MG tablet TAKE 1 TABLET(100 MG) BY MOUTH DAILY AS NEEDED FOR ERECTILE DYSFUNCTION 03/04/24   Ladona Heinz, MD  Suvorexant  (BELSOMRA ) 10 MG TABS Take 1 tablet (10 mg total) by mouth at bedtime as needed. 11/01/23   Kennyth Worth HERO, MD  testosterone  cypionate (DEPOTESTOSTERONE CYPIONATE) 200 MG/ML injection Inject 1 mL (200 mg total) into the muscle every 14 (fourteen) days. Patient not taking: Reported on 11/01/2023 10/17/22   Kennyth Worth HERO, MD  tirzepatide  (ZEPBOUND ) 7.5 MG/0.5ML Pen Inject 7.5 mg into the skin once a week. 05/16/23   Ladona Heinz, MD  triamterene -hydrochlorothiazide (MAXZIDE-25) 37.5-25 MG tablet TAKE 1 TABLET BY MOUTH EVERY DAY IN THE MORNING 12/10/23   Ladona Heinz, MD  valsartan  (DIOVAN ) 160 MG tablet TAKE 1 TABLET BY MOUTH EVERY DAY 04/21/24   Ladona Heinz, MD  Zinc 30 MG CAPS Take 1 capsule by mouth daily in the afternoon. Patient not taking: Reported on 04/28/2024    [provider]    Allergies: Patient has no known allergies.    Review of Systems  Skin:  Positive for rash.  Updated Vital Signs BP 120/63 (BP Location: Right Arm)   Pulse (!) 56   Temp 98.1 F (36.7 C) (Oral)   Resp 13   Ht 5' 9 (1.753 m)   Wt (!) 158.8 kg   SpO2 96%   BMI 51.69 kg/m   Physical Exam Vitals and nursing note reviewed.  Constitutional:      General: He is not in acute distress.    Appearance: He is well-developed. He is obese. He is not ill-appearing.  HENT:     Head: Normocephalic and atraumatic.     Mouth/Throat:     Comments: Posterior oropharynx, tongue, lips without any edema.  Patient swallowing secretions without difficulty Eyes:     Conjunctiva/sclera: Conjunctivae normal.     Comments: Right  upper eyelid with edema  Cardiovascular:     Rate and Rhythm: Normal rate and regular rhythm.     Heart sounds: No murmur heard. Pulmonary:     Effort: Pulmonary effort is normal. No respiratory distress.     Comments: Talking in full sentences on room air without difficulty  Wheezing noted in the upper lung fields bilaterally Abdominal:     Palpations: Abdomen is soft.     Tenderness: There is no abdominal tenderness.  Musculoskeletal:        General: No swelling.     Cervical back: Neck supple.  Skin:    General: Skin is warm and dry.     Capillary Refill: Capillary refill takes less than 2 seconds.     Comments: Urticarial rash of the left arm and bilateral axilla  Neurological:     Mental Status: He is alert.  Psychiatric:        Mood and Affect: Mood normal.     (all labs ordered are listed, but only abnormal results are displayed) Labs Reviewed - No data to display  EKG: None  Radiology: No results found.   Procedures   Medications Ordered in the ED  EPINEPHrine  (EPI-PEN) injection 0.3 mg (0.3 mg Intramuscular Given 05/22/24 1034)  methylPREDNISolone  sodium succinate (SOLU-MEDROL ) 125 mg/2 mL injection 125 mg (125 mg Intravenous Given 05/22/24 1038)  famotidine  (PEPCID ) IVPB 20 mg premix (20 mg Intravenous New Bag/Given 05/22/24 1041)    Clinical Course as of 05/22/24 1411  Fri May 22, 2024  1029 Evaluated patient, he has urticarial rash on the left upper arm and chest.  Wheezing in the upper lungs.  Reporting difficulty swallowing secretions.  Will proceed with IV methylprednisolone , famotidine , and IM epi.  He already took Benadryl  prior to arrival [AF]  1136 Rechecked, Rash improving and patient reports improvement in swallowing  [AF]  1230 Reevaluated patient.  He has less wheezing on exam.  Rash and eye swelling resolved.  Still tolerating oral secretions without difficulty and swallowing water at bedside. [AF]  1405 Patient reassessed, feels completely back at  baseline.  No wheezing on auscultation.  Swallowing secretions without difficulty.  No shortness of breath.  Stable vitals.  No rash. [AF]    Clinical Course User Index [AF] Veta Palma, PA-C                                 Medical Decision Making Risk Prescription drug management.     Differential diagnosis includes but is not limited to allergic reaction, anaphylaxis, contact dermatitis  ED Course:  Upon initial evaluation, patient is well-appearing, no acute distress.  Stable vitals.  He  has urticarial rash on the bilateral axilla and left arm upon arrival.  Mild edema to the right eyelid.  Also reporting some difficulty swallowing, but is tolerating secretions on exam.  No noticeable edema to the lips, tongue, posterior oropharynx. Given report of difficulty swallowing, wheezing on exam, and urticarial rash, will treat for possible anaphylactic reaction with EpiPen , methylprednisolone , and famotidine .  Patient already received Benadryl  prior to arrival.  Medications Given: Pepcid , methylprednisolone , epinephrine    Upon multiple reevaluations of the patient, patient continues to improve.  Rash and right eye swelling has resolved.  Wheezing has resolved.  Patient does not report any further difficulty swallowing and has been drinking water at bedside without difficulty.  Vital signs remained stable.  Patient has been observed for 4 hours in the ER after administration of epinephrine , methylprednisolone , and famotidine .  Given improvement in symptoms, feel he is stable and appropriate for discharge home at this time    Impression: Allergic reaction  Disposition:  The patient was discharged home with instructions to use prescribed EpiPen  if he develops symptoms of anaphylactic reaction at home in the future.  He understands if he uses the EpiPen , he will need to come immediately to the ER for further evaluation and treatment. Return precautions given.     This chart was  dictated using voice recognition software, Dragon. Despite the best efforts of this provider to proofread and correct errors, errors may still occur which can change documentation meaning.       Final diagnoses:  Allergic reaction, initial encounter    ED Discharge Orders          Ordered    EPINEPHrine  0.3 mg/0.3 mL IJ SOAJ injection  As needed        05/22/24 1408               Veta Palma, PA-C 05/22/24 1411    Pamella Sharper A, DO 05/23/24 [phone]

## 2024-05-22 NOTE — Discharge Instructions (Addendum)
 You were treated here today for your allergic reaction.  You have been prescribed an EpiPen  to use at home if you develop allergic reaction in the future that causes you feelings of throat closure or shortness of breath.  If you develop only a rash, you do not need to use the EpiPen .  If you use the EpiPen , you will need to come to the emergency room immediately afterwards for evaluation.  Please follow-up with your PCP if you need any further refills of your EpiPen .  You may continue to use Benadryl  at home as needed for rash and itching.  Please return to the emergency room for any shortness of breath, persistent vomiting, weakness, difficulty breathing, any other new or concerning symptoms

## 2024-05-22 NOTE — ED Triage Notes (Signed)
 In for multiple yellow jacket stings at 0800 today. No previous allergic reaction to yellow jackets. Rash to bilateral axilla and left arm. Swelling to right eye. Took benadryl  x2 at 0830. Reports difficulty swallowing but is maintaining oral secretions. No labored breathing noted.

## 2024-05-25 ENCOUNTER — Other Ambulatory Visit: Payer: Self-pay | Admitting: Family Medicine

## 2024-07-13 ENCOUNTER — Encounter: Payer: Self-pay | Admitting: Family Medicine

## 2024-07-13 ENCOUNTER — Ambulatory Visit: Payer: Self-pay

## 2024-07-13 ENCOUNTER — Ambulatory Visit: Admitting: Family Medicine

## 2024-07-13 VITALS — BP 138/88 | HR 62 | Temp 96.8°F | Ht 69.0 in | Wt 355.2 lb

## 2024-07-13 DIAGNOSIS — I10 Essential (primary) hypertension: Secondary | ICD-10-CM | POA: Diagnosis not present

## 2024-07-13 DIAGNOSIS — R059 Cough, unspecified: Secondary | ICD-10-CM

## 2024-07-13 MED ORDER — ALBUTEROL SULFATE HFA 108 (90 BASE) MCG/ACT IN AERS: 2.0000 | INHALATION_SPRAY | Freq: Four times a day (QID) | RESPIRATORY_TRACT | 0 refills | Status: DC | PRN

## 2024-07-13 MED ORDER — AZITHROMYCIN 250 MG PO TABS
ORAL_TABLET | ORAL | 0 refills | Status: DC
Start: 2024-07-13 — End: 2024-07-17

## 2024-07-13 MED ORDER — BENZONATATE 200 MG PO CAPS: 200.0000 mg | ORAL_CAPSULE | Freq: Two times a day (BID) | ORAL | 0 refills | Status: DC | PRN

## 2024-07-13 NOTE — Patient Instructions (Signed)
 It was very nice to see you today!  VISIT SUMMARY: Today, we addressed your cough, congestion, and diarrhea. You were diagnosed with acute bronchitis and given a treatment plan to help manage your symptoms.  YOUR PLAN: ACUTE BRONCHITIS WITH COUGH AND WHEEZING: You have been diagnosed with acute bronchitis, which is causing your persistent cough, wheezing, and congestion. There is also a possibility of early pneumonia. -You will take a Z-Pak (antibiotic) to treat the bronchitis and potential pneumonia. -You will take Tessalon  to help manage your cough. -Use an albuterol  inhaler to help open your airways. -Monitor your symptoms and report if they are not improving by Thursday or Friday. -A chest x-ray is not needed right now but may be considered if your symptoms persist or worsen.  ACUTE DIARRHEA: You have acute diarrhea that started with fever and chills, which may be related to your bronchitis or a viral infection. -Stay hydrated and monitor your symptoms. -If your diarrhea does not improve or worsens, please contact the office.  Return if symptoms worsen or fail to improve.   Take care, Dr Kennyth  PLEASE NOTE:  If you had any lab tests, please let us  know if you have not heard back within a few days. You may see your results on mychart before we have a chance to review them but we will give you a call once they are reviewed by us .   If we ordered any referrals today, please let us  know if you have not heard from their office within the next week.   If you had any urgent prescriptions sent in today, please check with the pharmacy within an hour of our visit to make sure the prescription was transmitted appropriately.   Please try these tips to maintain a healthy lifestyle:  Eat at least 3 REAL meals and 1-2 snacks per day.  Aim for no more than 5 hours between eating.  If you eat breakfast, please do so within one hour of getting up.   Each meal should contain half  fruits/vegetables, one quarter protein, and one quarter carbs (no bigger than a computer mouse)  Cut down on sweet beverages. This includes juice, soda, and sweet tea.   Drink at least 1 glass of water with each meal and aim for at least 8 glasses per day  Exercise at least 150 minutes every week.

## 2024-07-13 NOTE — Progress Notes (Signed)
   Michael [name] is a 58 y.o. male who presents today for an office visit.  Assessment/Plan:  New/Acute Problems: Cough  No red flags.  Overall reassuring exam without any signs of respiratory distress however does have significant wheeze and rhonchi concerning for bronchitis.  Given duration of symptoms  and reworsening of symptoms we will start azithromycin .  Also start albuterol  and Tessalon .  We did discuss COVID testing however would not significantly alter management at this point.  Also discussed chest x-ray however he would like to hold off on this for now.  He will let us  know if not improving in the next several days.  We discussed reasons to return to care.  Chronic Problems Addressed Today: Primary hypertension Blood pressure at goal today on triamterene  hydrochlorothiazide 37.5-25 daily, valsartan  160 mg daily and metoprolol  tartrate 25 mg twice daily.     Subjective:  HPI:  See assessment / plan for status of chronic conditions.    Discussed the use of AI scribe software for clinical note transcription with the patient, who gave verbal consent to proceed.  History of Present Illness Michael [name] is a 58 year old male who presents with cough, congestion, and diarrhea.  His symptoms began last Wednesday with a persistent cough that worsened by Thursday and became severe by Saturday. He developed fever, chills, and diarrhea on Saturday. The diarrhea has worsened since onset, while the cough has slightly improved over the past two days. The cough is productive with thick mucus that is difficult to expectorate, but once cleared, he feels better temporarily. He experiences body aches and generalized pain. No significant nasal congestion or sinus issues.  He mentions a recent CAT scan that showed diffuse thickening of the lung walls, which was performed due to experiencing shortness of breath during activities that previously did not cause such symptoms. He performed a home COVID  test on Saturday, which was negative, though the test was outdated. He is not currently experiencing fever, and his breathing is not terrible, though he acknowledges recent weight gain has affected his overall health.         Objective:  Physical Exam: BP 138/88   Pulse 62   Temp (!) 96.8 F (36 C) (Temporal)   Ht 5' 9 (1.753 m)   Wt (!) 355 lb 3.2 oz (161.1 kg)   SpO2 96%   BMI 52.45 kg/m   Gen: No acute distress, resting comfortably CV: Regular rate and rhythm with no murmurs appreciated Pulm: Normal work of breathing.  Speaking in full sentences.  Diffuse wheezes and rhonchi throughout all lung fields. Neuro: Grossly normal, moves all extremities Psych: Normal affect and thought content      Naveed Humphres M. Kennyth, MD 07/13/2024 2:15 PM

## 2024-07-13 NOTE — Assessment & Plan Note (Signed)
 Blood pressure at goal today on triamterene  hydrochlorothiazide 37.5-25 daily, valsartan  160 mg daily and metoprolol  tartrate 25 mg twice daily.

## 2024-07-13 NOTE — Telephone Encounter (Signed)
 FYI Only or Action Required?: Action required by provider: request for appointment.  Patient was last seen in primary care on 11/01/2023 by Kennyth Worth HERO, MD.  Called Nurse Triage reporting Cough.  Symptoms began several days ago.  Interventions attempted: OTC medications: mucinex.  Symptoms are: gradually improving.  Triage Disposition: See Physician Within 24 Hours  Patient/caregiver understands and will follow disposition?: YesCopied from CRM #[id number]. Topic: Clinical - Red Word Triage >> Jul 13, 2024  8:47 AM Mesmerise C wrote: [facility] that prompted transfer to Nurse Triage: Patient stated over last couple days has been coughing a green thick mucus, over the weekend couldn't get out of bed and had a fever along with diarrhea, has soreness all over body Reason for Disposition  SEVERE coughing spells (e.g., whooping sound after coughing, vomiting after coughing)  Answer Assessment - Initial Assessment Questions Pt has been taking mucinex. Pt is staying hydrated. No much of appetite. SOB only during intense coughing. Mucus is very thick       1. ONSET: When did the cough begin?      Last wednesday 2. SEVERITY: How bad is the cough today?      Very thick mucus  3. SPUTUM: Describe the color of your sputum (e.g., none, dry cough; clear, white, yellow, green)     Clear-yellow  4. HEMOPTYSIS: Are you coughing up any blood? If Yes, ask: How much? (e.g., flecks, streaks, tablespoons, etc.)     Last Friday once 5. DIFFICULTY BREATHING: Are you having difficulty breathing? If Yes, ask: How bad is it? (e.g., mild, moderate, severe)      Only while coughing hard 6. FEVER: Do you have a fever? If Yes, ask: What is your temperature, how was it measured, and when did it start?     Not today 7. CARDIAC HISTORY: Do you have any history of heart disease? (e.g., heart attack, congestive heart failure)      Stent  8. LUNG HISTORY: Do you have any history of lung  disease?  (e.g., pulmonary embolus, asthma, emphysema)     denies 9. PE RISK FACTORS: Do you have a history of blood clots? (or: recent major surgery, recent prolonged travel, bedridden)     Denis 10. OTHER SYMPTOMS: Do you have any other symptoms? (e.g., runny nose, wheezing, chest pain)       Wheezing, whole body aches, diarrhea  Protocols used: Cough - Acute Productive-A-AH

## 2024-07-13 NOTE — Telephone Encounter (Signed)
 Appt today

## 2024-07-17 ENCOUNTER — Telehealth: Admitting: Family Medicine

## 2024-07-17 ENCOUNTER — Ambulatory Visit: Payer: Self-pay

## 2024-07-17 ENCOUNTER — Encounter: Payer: Self-pay | Admitting: Family Medicine

## 2024-07-17 DIAGNOSIS — J069 Acute upper respiratory infection, unspecified: Secondary | ICD-10-CM

## 2024-07-17 MED ORDER — PROMETHAZINE-DM 6.25-15 MG/5ML PO SYRP: 5.0000 mL | ORAL_SOLUTION | Freq: Four times a day (QID) | ORAL | 0 refills | Status: AC | PRN

## 2024-07-17 MED ORDER — AZITHROMYCIN 250 MG PO TABS: ORAL_TABLET | ORAL | 0 refills | Status: DC

## 2024-07-17 NOTE — Telephone Encounter (Signed)
 Duplicate encounter, see other nurse triage notes.

## 2024-07-17 NOTE — Telephone Encounter (Signed)
 Patient called back for an update on his earlier call and asked to speak with the office. Patient transferred to the office for assistance.

## 2024-07-17 NOTE — Telephone Encounter (Signed)
 FYI Only or Action Required?: Action required by provider: update on patient condition.  Patient was last seen in primary care on 07/13/2024 by Kennyth Worth HERO, MD.  Called Nurse Triage reporting Cough, Diarrhea, and Fatigue.  Symptoms began 07/08/24.  Interventions attempted: OTC medications: throat spray, lozenges and Prescription medications: azithromycin , albuterol , tessalon .  Symptoms are: productive cough with yellow mucus, mild SOB with exertion (states when leaving the doctors office and walking to his car), diarrhea (4 episodes today, worsening), body aches, fatigue, sore throat gradually worsening.  Triage Disposition: See HCP Within 4 Hours (Or PCP Triage)  Patient/caregiver understands and will follow disposition?: No, wishes to speak with PCP               Copied from CRM #[id number]. Topic: Clinical - Red Word Triage >> Jul 17, 2024  1:27 PM Harlene ORN wrote: Red Word that prompted transfer to Nurse Triage: cold is getting worse; coughing up mucus and body aches. Diarrhea and fatigue. Reason for Disposition  [1] MILD difficulty breathing (e.g., minimal/no SOB at rest, SOB with walking, pulse < 100) AND [2] still present when not coughing  Answer Assessment - Initial Assessment Questions 1. ONSET: When did the cough begin?      07/08/24  2. SEVERITY: How bad is the cough today?      As frequent, slightly improved/not as thick when coughing up mucus. Having coughing spells/fits. Denies vomiting from coughing or chest pain.  3. SPUTUM: Describe the color of your sputum (e.g., none, dry cough; clear, white, yellow, green)     Clear and thick, has turned yellow.  4. HEMOPTYSIS: Are you coughing up any blood? If Yes, ask: How much? (e.g., flecks, streaks, tablespoons, etc.)     No.  5. DIFFICULTY BREATHING: Are you having difficulty breathing? If Yes, ask: How bad is it? (e.g., mild, moderate, severe)      He states no respiratory distress but he does  have SOB, states when he left the doctor's office and just walking to his car felt SOB.  6. FEVER: Do you have a fever? If Yes, ask: What is your temperature, how was it measured, and when did it start?     Unsure, has not checked temperature. Intermittent hot and cold feeling.  7. CARDIAC HISTORY: Do you have any history of heart disease? (e.g., heart attack, congestive heart failure)      Cardiac stent.  8. LUNG HISTORY: Do you have any history of lung disease?  (e.g., pulmonary embolus, asthma, emphysema)     Was told he has thickening in the lung walls.  9. PE RISK FACTORS: Do you have a history of blood clots? (or: recent major surgery, recent prolonged travel, bedridden)     No.  10. OTHER SYMPTOMS: Do you have any other symptoms? (e.g., runny nose, wheezing, chest pain)       Fatigue, diarrhea (4 episodes today; worsening this week), sore throat, body aches. Denies runny nose, ear aches.  11. PREGNANCY: Is there any chance you are pregnant? When was your last menstrual period?       N/A.  12. TRAVEL: Have you traveled out of the country in the last month? (e.g., travel history, exposures)       No.  Protocols used: Cough - Acute Productive-A-AH

## 2024-07-17 NOTE — Progress Notes (Signed)
E-Visit for Cough  We are sorry that you are not feeling well.  Here is how we plan to help!  Based on your presentation I believe you most likely have A cough due to bacteria.  When patients have a fever and a productive cough with a change in color or increased sputum production, we are concerned about bacterial bronchitis.  If left untreated it can progress to pneumonia.  If your symptoms do not improve with your treatment plan it is important that you contact your provider.   I have prescribed Azithromyin 250 mg: two tablets now and then one tablet daily for 4 additonal days    In addition you may use promethazine dm    From your responses in the eVisit questionnaire you describe inflammation in the upper respiratory tract which is causing a significant cough.  This is commonly called Bronchitis and has four common causes:   Allergies Viral Infections Acid Reflux Bacterial Infection Allergies, viruses and acid reflux are treated by controlling symptoms or eliminating the cause. An example might be a cough caused by taking certain blood pressure medications. You stop the cough by changing the medication. Another example might be a cough caused by acid reflux. Controlling the reflux helps control the cough.  USE OF BRONCHODILATOR ("RESCUE") INHALERS: There is a risk from using your bronchodilator too frequently.  The risk is that over-reliance on a medication which only relaxes the muscles surrounding the breathing tubes can reduce the effectiveness of medications prescribed to reduce swelling and congestion of the tubes themselves.  Although you feel brief relief from the bronchodilator inhaler, your asthma may actually be worsening with the tubes becoming more swollen and filled with mucus.  This can delay other crucial treatments, such as oral steroid medications. If you need to use a bronchodilator inhaler daily, several times per day, you should discuss this with your provider.  There are  probably better treatments that could be used to keep your asthma under control.     HOME CARE Only take medications as instructed by your medical team. Complete the entire course of an antibiotic. Drink plenty of fluids and get plenty of rest. Avoid close contacts especially the very young and the elderly Cover your mouth if you cough or cough into your sleeve. Always remember to wash your hands A steam or ultrasonic humidifier can help congestion.   GET HELP RIGHT AWAY IF: You develop worsening fever. You become short of breath You cough up blood. Your symptoms persist after you have completed your treatment plan MAKE SURE YOU  Understand these instructions. Will watch your condition. Will get help right away if you are not doing well or get worse.    Thank you for choosing an e-visit.  Your e-visit answers were reviewed by a board certified advanced clinical practitioner to complete your personal care plan. Depending upon the condition, your plan could have included both over the counter or prescription medications.  Please review your pharmacy choice. Make sure the pharmacy is open so you can pick up prescription now. If there is a problem, you may contact your provider through [employer] and have the prescription routed to another pharmacy.  Your safety is important to [place]. If you have drug allergies check your prescription carefully.   For the next 24 hours you can use MyChart to ask questions about today's visit, request a non-urgent call back, or ask for a work or school excuse. You will get an email in the next  two days asking about your experience. I hope that your e-visit has been valuable and will speed your recovery.    have provided 5 minutes of non face to face time during this encounter for chart review and documentation.

## 2024-07-17 NOTE — Telephone Encounter (Signed)
**Note De-identified  [place] Obfuscation** Please advise 

## 2024-07-21 NOTE — Telephone Encounter (Signed)
 On 07/17/24, Ray, triage nurse, called with patient on backline stating that patient was wanting to speak with someone in clinic. I informed Ray that per my office manager, Tammy Peace, patient would need to go to UC since no providers/appointments for the late afternoon were available. Per Ray, patient verbalized understanding but still requested to speak with someone in clinic. I spoke with patient and informed him that since end of day, there is not a provider or appointment available. I informed patient that best option at that point would be to go to UC. Patient expressed disappointment and asked if there was another round of abx that could be snet since he was just seen on 07/13/24. I informed patient that if didn't want to go to UC, could do an E-visit via Mychart. I walked patient through the process and patient expressed gratitude before continuing with E-visit and disconnecting call.

## 2024-07-21 NOTE — Telephone Encounter (Signed)
 See previews note

## 2024-07-21 NOTE — Telephone Encounter (Signed)
 Left message to return call to our office at their convenience.

## 2024-07-21 NOTE — Telephone Encounter (Signed)
 Spoke with patient stated has one more day of antibiotic, doing some better Still with productive cough Advise to schedule an office visit if not improving within a couple of more days

## 2024-07-21 NOTE — Telephone Encounter (Signed)
 If still not improving recommend he come back for us  to take a look and also check a chest xray.  Worth HERO. Kennyth, MD 07/21/2024 8:10 AM

## 2024-08-01 ENCOUNTER — Other Ambulatory Visit: Payer: Self-pay | Admitting: Family Medicine

## 2024-08-08 ENCOUNTER — Other Ambulatory Visit: Payer: Self-pay | Admitting: Cardiology

## 2024-10-30 ENCOUNTER — Other Ambulatory Visit: Payer: Self-pay | Admitting: Cardiology

## 2024-10-30 DIAGNOSIS — I10 Essential (primary) hypertension: Secondary | ICD-10-CM

## 2024-11-02 MED ORDER — BUMETANIDE 0.5 MG PO TABS: 0.5000 mg | ORAL_TABLET | Freq: Every day | ORAL | 0 refills | Status: AC

## 2024-11-06 ENCOUNTER — Ambulatory Visit

## 2024-11-06 ENCOUNTER — Ambulatory Visit (INDEPENDENT_AMBULATORY_CARE_PROVIDER_SITE_OTHER): Payer: 59 | Admitting: Family Medicine

## 2024-11-06 VITALS — BP 102/70 | HR 70 | Temp 97.5°F | Ht 69.0 in | Wt 355.2 lb

## 2024-11-06 DIAGNOSIS — Z1211 Encounter for screening for malignant neoplasm of colon: Secondary | ICD-10-CM

## 2024-11-06 DIAGNOSIS — M542 Cervicalgia: Secondary | ICD-10-CM

## 2024-11-06 DIAGNOSIS — N521 Erectile dysfunction due to diseases classified elsewhere: Secondary | ICD-10-CM

## 2024-11-06 DIAGNOSIS — Z125 Encounter for screening for malignant neoplasm of prostate: Secondary | ICD-10-CM | POA: Diagnosis not present

## 2024-11-06 DIAGNOSIS — Z6841 Body Mass Index (BMI) 40.0 and over, adult: Secondary | ICD-10-CM

## 2024-11-06 DIAGNOSIS — Z23 Encounter for immunization: Secondary | ICD-10-CM

## 2024-11-06 DIAGNOSIS — M109 Gout, unspecified: Secondary | ICD-10-CM

## 2024-11-06 DIAGNOSIS — E538 Deficiency of other specified B group vitamins: Secondary | ICD-10-CM | POA: Diagnosis not present

## 2024-11-06 DIAGNOSIS — I5030 Unspecified diastolic (congestive) heart failure: Secondary | ICD-10-CM

## 2024-11-06 DIAGNOSIS — M549 Dorsalgia, unspecified: Secondary | ICD-10-CM | POA: Diagnosis not present

## 2024-11-06 DIAGNOSIS — E785 Hyperlipidemia, unspecified: Secondary | ICD-10-CM

## 2024-11-06 DIAGNOSIS — I10 Essential (primary) hypertension: Secondary | ICD-10-CM

## 2024-11-06 DIAGNOSIS — Z0001 Encounter for general adult medical examination with abnormal findings: Secondary | ICD-10-CM | POA: Diagnosis not present

## 2024-11-06 MED ORDER — ALBUTEROL SULFATE HFA 108 (90 BASE) MCG/ACT IN AERS: 2.0000 | INHALATION_SPRAY | Freq: Four times a day (QID) | RESPIRATORY_TRACT | 0 refills | Status: AC | PRN

## 2024-11-06 MED ORDER — SILDENAFIL CITRATE 100 MG PO TABS: 100.0000 mg | ORAL_TABLET | ORAL | 1 refills | Status: AC | PRN

## 2024-11-06 MED ORDER — NITROGLYCERIN 0.4 MG SL SUBL: 0.4000 mg | SUBLINGUAL_TABLET | SUBLINGUAL | 3 refills | Status: AC | PRN

## 2024-11-06 NOTE — Assessment & Plan Note (Signed)
 Check B12

## 2024-11-06 NOTE — Assessment & Plan Note (Signed)
 Blood pressure at goal today on Maxide 37.5-25 daily, valsartan  160 mg daily, metoprolol  tartrate 25 mg twice daily.

## 2024-11-06 NOTE — Assessment & Plan Note (Signed)
 On sildenafil  100 mg daily as needed.  Cardiology has had prescribed this for him previously that we will refill for him today.  He is aware to not take with nitroglycerin 

## 2024-11-06 NOTE — Patient Instructions (Signed)
 It was very nice to see you today!  VISIT SUMMARY: You came in for your annual physical exam. We discussed your neck and back pain, medication refills, and your plans to improve diet and exercise. We also reviewed your heart health, blood pressure, cholesterol, weight management, and arthritis.  YOUR PLAN: HEART FAILURE WITH PRESERVED EJECTION FRACTION: Your heart condition is stable with well-controlled blood pressure and heart rate. -No need for cardiology follow-up at this time. -Blood work ordered for cholesterol and A1c. -Refilled nitroglycerin  prescription.  PRIMARY HYPERTENSION: Your blood pressure is well-controlled with your current medications. -Continue taking your current antihypertensive medications.  HYPERLIPIDEMIA: Monitoring your cholesterol levels is important for your heart health. -Blood work ordered for cholesterol levels.  MORBID OBESITY: Previous weight loss medications were not effective for you. -No plans to restart weight loss medications. -Blood work ordered for health assessment.  ERECTILE DYSFUNCTION: You need a refill for your sildenafil  prescription. -Refilled sildenafil  prescription.  ARTHRITIS OF CERVICAL AND LUMBAR SPINE: Your neck and back pain are likely due to arthritis. -X-rays ordered for your neck and lumbar spine. -Will discuss referral to an orthopedist or physical therapist based on x-ray results.  GENERAL HEALTH MAINTENANCE: Routine health maintenance was discussed. -Administered pneumonia vaccine. -Ordered Cologuard screening.  Return in about 1 year (around 11/06/2025) for Annual Physical.   Take care, Dr Kennyth  PLEASE NOTE:  If you had any lab tests, please let us  know if you have not heard back within a few days. You may see your results on mychart before we have a chance to review them but we will give you a call once they are reviewed by us .   If we ordered any referrals today, please let us  know if you have not heard from  their office within the next week.   If you had any urgent prescriptions sent in today, please check with the pharmacy within an hour of our visit to make sure the prescription was transmitted appropriately.   Please try these tips to maintain a healthy lifestyle:  Eat at least 3 REAL meals and 1-2 snacks per day.  Aim for no more than 5 hours between eating.  If you eat breakfast, please do so within one hour of getting up.   Each meal should contain half fruits/vegetables, one quarter protein, and one quarter carbs (no bigger than a computer mouse)  Cut down on sweet beverages. This includes juice, soda, and sweet tea.   Drink at least 1 glass of water with each meal and aim for at least 8 glasses per day  Exercise at least 150 minutes every week.     Preventive Care 50-81 Years Old, Male Preventive care refers to lifestyle choices and visits with your health care provider that can promote health and wellness. Preventive care visits are also called wellness exams. What can I expect for my preventive care visit? Counseling During your preventive care visit, your health care provider may ask about your: Medical history, including: Past medical problems. Family medical history. Current health, including: Emotional well-being. Home life and relationship well-being. Sexual activity. Lifestyle, including: Alcohol, nicotine or tobacco, and drug use. Access to firearms. Diet, exercise, and sleep habits. Safety issues such as seatbelt and bike helmet use. Sunscreen use. Work and work astronomer. Physical exam Your health care provider will check your: Height and weight. These may be used to calculate your BMI (body mass index). BMI is a measurement that tells if you are at a healthy weight. Waist  circumference. This measures the distance around your waistline. This measurement also tells if you are at a healthy weight and may help predict your risk of certain diseases, such as type 2  diabetes and high blood pressure. Heart rate and blood pressure. Body temperature. Skin for abnormal spots. What immunizations do I need?  Vaccines are usually given at various ages, according to a schedule. Your health care provider will recommend vaccines for you based on your age, medical history, and lifestyle or other factors, such as travel or where you work. What tests do I need? Screening Your health care provider may recommend screening tests for certain conditions. This may include: Lipid and cholesterol levels. Diabetes screening. This is done by checking your blood sugar (glucose) after you have not eaten for a while (fasting). Hepatitis B test. Hepatitis C test. HIV (human immunodeficiency virus) test. STI (sexually transmitted infection) testing, if you are at risk. Lung cancer screening. Prostate cancer screening. Colorectal cancer screening. Talk with your health care provider about your test results, treatment options, and if necessary, the need for more tests. Follow these instructions at home: Eating and drinking  Eat a diet that includes fresh fruits and vegetables, whole grains, lean protein, and low-fat dairy products. Take vitamin and mineral supplements as recommended by your health care provider. Do not drink alcohol if your health care provider tells you not to drink. If you drink alcohol: Limit how much you have to 0-2 drinks a day. Know how much alcohol is in your drink. In the [place] one drink equals one 12 oz bottle of beer (355 mL), one 5 oz glass of wine (148 mL), or one 1 oz glass of hard liquor (44 mL). Lifestyle Brush your teeth every morning and night with fluoride toothpaste. Floss one time each day. Exercise for at least 30 minutes 5 or more days each week. Do not use any products that contain nicotine or tobacco. These products include cigarettes, chewing tobacco, and vaping devices, such as e-cigarettes. If you need help quitting, ask your  health care provider. Do not use drugs. If you are sexually active, practice safe sex. Use a condom or other form of protection to prevent STIs. Take aspirin  only as told by your health care provider. Make sure that you understand how much to take and what form to take. Work with your health care provider to find out whether it is safe and beneficial for you to take aspirin  daily. Find healthy ways to manage stress, such as: Meditation, yoga, or listening to music. Journaling. Talking to a trusted person. Spending time with friends and family. Minimize exposure to UV radiation to reduce your risk of skin cancer. Safety Always wear your seat belt while driving or riding in a vehicle. Do not drive: If you have been drinking alcohol. Do not ride with someone who has been drinking. When you are tired or distracted. While texting. If you have been using any mind-altering substances or drugs. Wear a helmet and other protective equipment during sports activities. If you have firearms in your house, make sure you follow all gun safety procedures. What's next? Go to your health care provider once a year for an annual wellness visit. Ask your health care provider how often you should have your eyes and teeth checked. Stay up to date on all vaccines. This information is not intended to replace advice given to you by your health care provider. Make sure you discuss any questions you have with your health care  provider. Document Revised: 05/03/2021 Document Reviewed: 05/03/2021 Elsevier Patient Education  2024 [employer].

## 2024-11-06 NOTE — Assessment & Plan Note (Signed)
 BMI today 42.45 with comorbidities.  Discussed lifestyle modifications.  He did not have much success with Zepbound  last year  though only got up to 7.5 mg weekly.  Discussed with patient this is likely not an adequate dose though he is not interested in restarting at this point.

## 2024-11-06 NOTE — Assessment & Plan Note (Signed)
 On Lipitor  80 mg daily.  Check labs today.

## 2024-11-06 NOTE — Assessment & Plan Note (Signed)
 Follows with cardiology.  Euvolemic today.  On Bumex , metoprolol , and valsartan 

## 2024-11-06 NOTE — Assessment & Plan Note (Signed)
 Check uric acid with labs.  He is on allopurinol  100 mg daily

## 2024-11-06 NOTE — Progress Notes (Signed)
 "  Chief Complaint:  Michael [name] is a 58 y.o. male who presents today for his annual comprehensive physical exam.    Assessment/Plan:  New/Acute Problems: Neck Pain / Back Pain  No red flags.  Likely secondary to degenerative changes.  Given that both of these have been persistent for the last year or so we will check x-rays to further evaluate.  Plan to discuss referral to sports medicine/orthopedics or physical therapy however he like to hold off on this for now.  He will let us  know if he changes his mind.  Chronic Problems Addressed Today: No problem-specific Assessment & Plan notes found for this encounter.  Preventative Healthcare: Check labs.  Pneumonia vaccine given today.  Will order Cologuard as well.  Patient Counseling(The following topics were reviewed and/or handout was given):  -Nutrition: Stressed importance of moderation in sodium/caffeine intake, saturated fat and cholesterol, caloric balance, sufficient intake of fresh fruits, vegetables, and fiber.  -Stressed the importance of regular exercise.   -Substance Abuse: Discussed cessation/primary prevention of tobacco, alcohol, or other drug use; driving or other dangerous activities under the influence; availability of treatment for abuse.   -Injury prevention: Discussed safety belts, safety helmets, smoke detector, smoking near bedding or upholstery.   -Sexuality: Discussed sexually transmitted diseases, partner selection, use of condoms, avoidance of unintended pregnancy and contraceptive alternatives.   -Dental health: Discussed importance of regular tooth brushing, flossing, and dental visits.  -Health maintenance and immunizations reviewed. Please refer to Health maintenance section.  Return to care in 1 year for next preventative visit.     Subjective:  HPI:  He has no acute complaints today. Patient is here today for his annual physical.  See assessment / plan for status of chronic conditions.  Discussed the  use of AI scribe software for clinical note transcription with the patient, who gave verbal consent to proceed.  History of Present Illness Michael [name] is a 58 year old male with a history of heart attack who presents for an annual physical exam.  He has a persistent 'crick' in his neck on the left side and some back pain, which he attributes to increased sitting at work this year. These symptoms have been ongoing since last year and have become more noticeable over time. He thought it was worth mentioning.  He is currently taking nitroglycerin  and sildenafil  and needs refills for these medications. He has not seen Dr. Comer recently.  He has not been doing well with diet and exercise but plans to start improving these areas with his wife at the beginning of the year. He previously tried Zepbound  and Ozempic  for weight management but did not see results and discontinued them. His insurance covered these medications, but he did not reach the full dose of Zepbound .      11/06/2024    2:08 PM  Depression screen PHQ 2/9  Decreased Interest 0  Down, Depressed, Hopeless 0  PHQ - 2 Score 0    Health Maintenance Due  Topic Date Due   Pneumococcal Vaccine: 50+ Years (1 of 2 - PCV) Never done   Hepatitis B Vaccines 19-59 Average Risk (1 of 3 - 19+ 3-dose series) Never done   Fecal DNA (Cologuard)  06/07/2024     ROS: Per HPI, otherwise a complete review of systems was negative.   PMH:  The following were reviewed and entered/updated in epic: Past Medical History:  Diagnosis Date   (HFpEF) heart failure with preserved ejection fraction (HCC)  a. 05/2020 Echo: EF 60%, no rwma, Gr2 DD. Triv MR.   Back pain    CAD (coronary artery disease)    a. 04/2020 NSTEMI/PCI: LM nl, LAD nl, LCX nl, RCA 93m, 85d (3.6x26 Resolute Onyx DES), RPDA nl, RPAV nl, EF 50-55%.   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 06/13/2023   Gout 10/05/2022   Low testosterone  08/16/2020   CAD (coronary artery disease) s/p NSTEMI 2021 05/21/2020   (HFpEF) heart failure with preserved ejection fraction (HCC) 05/21/2020   Dyshidrosis 08/31/2019   Insomnia 08/31/2019   Hyperlipidemia LDL goal <70 08/28/2018   Erectile dysfunction 08/28/2018   Primary hypertension 08/28/2018   Obesity, morbid (HCC) 02/24/2014   OSA treated with BiPAP 08/27/2013   Nocturia 08/27/2013   Obesity hypoventilation syndrome (HCC)    Past Surgical History:  Procedure Laterality Date   CARDIAC CATHETERIZATION     CORONARY STENT INTERVENTION N/A 05/20/2020   Procedure: CORONARY STENT INTERVENTION;  Surgeon: Mady Bruckner, MD;  Location: [facility];  Service: Cardiovascular;  Laterality: N/A;   LEFT HEART CATH AND CORONARY ANGIOGRAPHY N/A 05/20/2020   Procedure: LEFT HEART CATH AND CORONARY ANGIOGRAPHY;  Surgeon: Mady Bruckner, MD;  Location: [facility];  Service: Cardiovascular;  Laterality: N/A;    Family History  Problem Relation Age of Onset   Alzheimer's disease Mother     Medications- reviewed and updated Current Outpatient Medications  Medication Sig Dispense Refill   acitretin (SORIATANE) 25 MG capsule Take 25 mg by mouth daily before breakfast.     albuterol  (VENTOLIN  HFA) 108 (90 Base) MCG/ACT inhaler Inhale 2 puffs into the lungs every 6 (six) hours as needed  for wheezing or shortness of breath. 8 g 0   allopurinol  (ZYLOPRIM ) 100 MG tablet TAKE 1 TABLET BY MOUTH EVERY DAY 30 tablet 6   aspirin  EC (ASPIRIN  LOW DOSE) 81 MG tablet Take 1 tablet (81 mg total) by mouth daily. 30 tablet 10   atorvastatin  (LIPITOR ) 80 MG tablet TAKE 1 TABLET BY MOUTH EVERYDAY AT BEDTIME 90 tablet 1   bumetanide  (BUMEX ) 0.5 MG tablet Take 1 tablet (0.5 mg total) by mouth daily. 90 tablet 0   EPINEPHrine  0.3 mg/0.3 mL IJ SOAJ injection Inject 0.3 mg into the muscle as needed for anaphylaxis. 1 each 0   ezetimibe  (ZETIA ) 10 MG tablet Take 1 tablet (10 mg total) by mouth daily. 90 tablet 3   MAGNESIUM PO Take by mouth.     metoprolol  tartrate (LOPRESSOR ) 25 MG tablet TAKE 1 TABLET BY MOUTH TWICE A DAY 180 tablet 2   Multiple Vitamin (MULTIVITAMIN) capsule Take 1 capsule by mouth daily.     nitroGLYCERIN  (NITROSTAT ) 0.4 MG SL tablet Place 1 tablet (0.4 mg total) under the tongue every 5 (five) minutes x 3 doses as  needed for chest pain. 25 tablet 3   sildenafil  (VIAGRA ) 100 MG tablet TAKE 1 TABLET(100 MG) BY MOUTH DAILY AS NEEDED FOR ERECTILE DYSFUNCTION 30 tablet 1   Suvorexant  (BELSOMRA ) 10 MG TABS TAKE 1 TABLET BY MOUTH AT BEDTIME AS NEEDED. 30 tablet 5   triamterene -hydrochlorothiazide (MAXZIDE-25) 37.5-25 MG tablet TAKE 1 TABLET BY MOUTH EVERY DAY IN THE MORNING 90 tablet 3   valsartan  (DIOVAN ) 160 MG tablet TAKE 1 TABLET BY MOUTH EVERY DAY 30 tablet 7   No current facility-administered medications for this visit.    Allergies-reviewed and updated Allergies[1]  Social History   Socioeconomic History   Marital status: Divorced    Spouse name: Not on file   Number of children: 1   Years of education: 14   Highest education level: Not on file  Occupational History    Employer: [employer]   Occupation: Customer Service Manager  Tobacco Use   Smoking status: Never    Passive exposure: Never   Smokeless tobacco: Never  Vaping Use   Vaping status: Never Used  Substance and  Sexual Activity   Alcohol use: Yes    Comment: rarely   Drug use: No   Sexual activity: Not on file  Other Topics Concern   Not on file  Social History Narrative   Patient is single and lives alone.   Patient is working full-time.   Patient has a college education.   Patient is right-handed.   Patient drinks 3-4 cups of coffee daily.   Social Drivers of Health   Tobacco Use: Low Risk (07/13/2024)   Patient History    Smoking Tobacco Use: Never    Smokeless Tobacco Use: Never    Passive Exposure: Never  Financial Resource Strain: Not on file  Food Insecurity: No Food Insecurity (09/18/2023)   Hunger Vital Sign    Worried About Running Out of Food in the Last Year: Never true    Ran Out of Food in the Last Year: Never true  Transportation Needs: No Transportation Needs (09/18/2023)   PRAPARE - Administrator, Civil Service (Medical): No    Lack of Transportation (Non-Medical): No  Physical Activity: Not on file  Stress: Not on file  Social Connections: Not on file  Depression (PHQ2-9): Low Risk (11/06/2024)   Depression (PHQ2-9)    PHQ-2 Score: 0  Alcohol Screen: Not on file  Housing: Low Risk (09/16/2023)   Received from [facility]   Epic    What is your living situation today?: I have a steady place to live    Think about the place you live. Do you have problems with any of the following? Choose all that apply:: None/None on this list  Utilities: Low Risk (09/16/2023)   Received from [facility]   Utilities    In the past 12 months has the electric, gas, oil, or water company threatened to shut off services in your home? : No  Health Literacy: Not on file        Objective:  Physical Exam: BP 102/70   Pulse 70   Temp (!) 97.5 F (36.4 C) (Temporal)   Ht 5' 9 (1.753 m)   Wt (!) 355 lb 3.2 oz (161.1 kg)   SpO2 95%   BMI 52.45 kg/m   Body mass index is 52.45 kg/m. Wt Readings from Last 3 Encounters:  11/06/24 (!) 355 lb 3.2 oz (161.1 kg)   07/13/24 (!) 355 lb 3.2 oz (161.1 kg)  05/22/24 ROLLEN)  350 lb (158.8 kg)   Gen: NAD, resting comfortably HEENT: TMs normal bilaterally. OP clear. No thyromegaly noted.  CV: RRR with no murmurs appreciated Pulm: NWOB, CTAB with no crackles, wheezes, or rhonchi GI: Normal bowel sounds present. Soft, Nontender, Nondistended. MSK: no edema, cyanosis, or clubbing noted Skin: warm, dry Neuro: CN2-12 grossly intact. Strength 5/5 in upper and lower extremities. Reflexes symmetric and intact bilaterally.  Psych: Normal affect and thought content     Camar Guyton M. Kennyth, MD 11/06/2024 2:20 PM     [1] No Known Allergies  "

## 2024-11-07 LAB — COMPREHENSIVE METABOLIC PANEL WITH GFR
AG Ratio: 1.5 (calc) (ref 1.0–2.5)
ALT: 41 U/L (ref 9–46)
AST: 36 U/L — ABNORMAL HIGH (ref 10–35)
Albumin: 4.6 g/dL (ref 3.6–5.1)
Alkaline phosphatase (APISO): 62 U/L (ref 35–144)
BUN/Creatinine Ratio: 23 (calc) — ABNORMAL HIGH (ref 6–22)
BUN: 26 mg/dL — ABNORMAL HIGH (ref 7–25)
CO2: 26 mmol/L (ref 20–32)
Calcium: 9.6 mg/dL (ref 8.6–10.3)
Chloride: 102 mmol/L (ref 98–110)
Creat: 1.14 mg/dL (ref 0.70–1.30)
Globulin: 3 g/dL (ref 1.9–3.7)
Glucose, Bld: 78 mg/dL (ref 65–99)
Potassium: 4.1 mmol/L (ref 3.5–5.3)
Sodium: 139 mmol/L (ref 135–146)
Total Bilirubin: 0.6 mg/dL (ref 0.2–1.2)
Total Protein: 7.6 g/dL (ref 6.1–8.1)
eGFR: 75 mL/min/1.73m2

## 2024-11-07 LAB — LIPID PANEL
Cholesterol: 158 mg/dL
HDL: 34 mg/dL — ABNORMAL LOW
Non-HDL Cholesterol (Calc): 124 mg/dL
Total CHOL/HDL Ratio: 4.6 (calc)
Triglycerides: 453 mg/dL — ABNORMAL HIGH

## 2024-11-07 LAB — URIC ACID: Uric Acid, Serum: 9.4 mg/dL — ABNORMAL HIGH (ref 4.0–8.0)

## 2024-11-07 LAB — HEMOGLOBIN A1C
Hgb A1c MFr Bld: 5.6 %
Mean Plasma Glucose: 114 mg/dL
eAG (mmol/L): 6.3 mmol/L

## 2024-11-07 LAB — PSA: PSA: 4 ng/mL

## 2024-11-07 LAB — VITAMIN B12: Vitamin B-12: 627 pg/mL (ref 200–1100)

## 2024-11-07 LAB — TSH: TSH: 4.32 m[IU]/L (ref 0.40–4.50)

## 2024-11-09 ENCOUNTER — Ambulatory Visit: Payer: Self-pay | Admitting: Family Medicine

## 2024-11-09 NOTE — Progress Notes (Signed)
 His BUN is mildly elevated. This usually indicates dehydration. HE should push fluids and we can recheck again at this next visit.   His triglycerides are elevated. Has he been consistent with his Lipitor  and Zetia ? If so, we could considering adding on vascepa to help lower his triglycerides or he can discuss further with Dr Ladona if he prefers that.   His uric acid level is also a but above goal. Recommend we increase allopurinol  to 200 mg daily and recheck again in a few weeks if he is agreeable.   His PSA is also mildly elevated compared to last year. Recommend we recheck this again in 6 months.   We can recheck everything else in a year.

## 2024-11-17 ENCOUNTER — Other Ambulatory Visit: Payer: Self-pay

## 2024-11-17 MED ORDER — ALLOPURINOL 100 MG PO TABS: 100.0000 mg | ORAL_TABLET | Freq: Every day | ORAL | 6 refills | Status: AC

## 2024-11-17 NOTE — Telephone Encounter (Unsigned)
 Copied from CRM #[id number]. Topic: Clinical - Lab/Test Results >> Nov 17, 2024  2:06 PM China J wrote: Reason for CRM: The patient is returning a call for lab results. He would like to know what PSA is. Other than that, he has no further questins.  He wanted to let Dr. Kennyth know that he has been taking his zeptia and lipitor  consistently but has only taken allopurinol  as needed.

## 2024-11-17 NOTE — Progress Notes (Signed)
 Left voicemail for patient to return call or respond via MyChart to Dr. Carroll result message.

## 2024-11-17 NOTE — Telephone Encounter (Signed)
 Copied from CRM #[id number]. Topic: Clinical - Lab/Test Results >> Nov 17, 2024  2:06 PM China J wrote: Reason for CRM: The patient is returning a call for lab results. He would like to know what PSA is. Other than that, he has no further questins.  He wanted to let Dr. Kennyth know that he has been taking his zeptia and lipitor  consistently but has only taken allopurinol  as needed.

## 2024-11-17 NOTE — Progress Notes (Signed)
 His PSA is 4.00

## 2024-11-26 NOTE — Progress Notes (Signed)
 X-ray shows degenerative changes in his neck and low back.  He should let us  know if symptoms are not improving and we can refer him over to see physical therapy or sports medicine.

## 2024-11-29 ENCOUNTER — Other Ambulatory Visit: Payer: Self-pay | Admitting: Cardiology

## 2024-11-29 DIAGNOSIS — E782 Mixed hyperlipidemia: Secondary | ICD-10-CM

## 2024-12-05 LAB — COLOGUARD: COLOGUARD: NEGATIVE

## 2024-12-08 NOTE — Progress Notes (Signed)
 Good news! ColoGuard is negative.  We can recheck again in 3 years.

## 2024-12-25 ENCOUNTER — Other Ambulatory Visit: Payer: Self-pay | Admitting: Cardiology

## 2024-12-25 DIAGNOSIS — E782 Mixed hyperlipidemia: Secondary | ICD-10-CM

## 2025-02-25 ENCOUNTER — Ambulatory Visit: Admitting: Cardiology

## 2025-11-09 ENCOUNTER — Encounter: Admitting: Family Medicine
